# Patient Record
Sex: Female | Born: 1955 | Race: White | Hispanic: No | Marital: Single | Smoking: Never smoker
Health system: Southern US, Community
[De-identification: ages and names within clinical notes are randomized; demographics above are authoritative.]

## PROBLEM LIST (undated history)

## (undated) DIAGNOSIS — C801 Malignant (primary) neoplasm, unspecified: Secondary | ICD-10-CM

## (undated) DIAGNOSIS — C4491 Basal cell carcinoma of skin, unspecified: Secondary | ICD-10-CM

## (undated) HISTORY — PX: COLONOSCOPY: SHX174

---

## 2019-09-03 ENCOUNTER — Other Ambulatory Visit: Payer: Self-pay | Admitting: Radiology

## 2019-09-09 ENCOUNTER — Encounter: Payer: Self-pay | Admitting: *Deleted

## 2019-09-09 DIAGNOSIS — Z170 Estrogen receptor positive status [ER+]: Secondary | ICD-10-CM | POA: Insufficient documentation

## 2019-09-09 DIAGNOSIS — C50412 Malignant neoplasm of upper-outer quadrant of left female breast: Secondary | ICD-10-CM | POA: Insufficient documentation

## 2019-09-09 NOTE — Progress Notes (Signed)
[facility] CONSULT NOTE  Patient Care Team: Joella Prince as PCP - General (Physician Assistant) Mauro Kaufmann, RN as Oncology Nurse Navigator Rockwell Germany, RN as Oncology Nurse Navigator  CHIEF COMPLAINTS/PURPOSE OF CONSULTATION:  Newly diagnosed breast cancer  HISTORY OF PRESENTING ILLNESS:  Hannah Wallace 63 y.o. female is here because of recent diagnosis of invasive ductal carcinoma of the left breast. The cancer was detected on a routine screening mammogram on 918/20 that showed a 1.4cm indeterminate left breast mass. [place] on 08/22/19 showed a 1.0cm left breast mass and a 0.5cm lymph node with cortical thickening at the 2:00 position in the left breast. Biopsy on 09/03/19 showed invasive ductal carcinoma, grade 1, HER-2 negative by FISH, ER+ 90%, PR+ 100%, Ki67 2%, with no malignancy in the lymph node. She presents to the clinic today for initial evaluation and discussion of treatment options.   I reviewed her records extensively and collaborated the history with the patient.  SUMMARY OF ONCOLOGIC HISTORY: Oncology History  Malignant neoplasm of upper-outer quadrant of left breast in female, estrogen receptor positive ([place])  09/03/2019 Initial Diagnosis   Routine screening mammogram detected a 1.4cm indeterminate left breast mass. US showed a 1.0cm left breast mass and a 0.5cm lymph node with cortical thickening at the 2:00 position in the left breast. 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   09/10/2019 Cancer Staging   Staging form: Breast, AJCC 8th Edition - Clinical stage from 09/10/2019: Stage IA (cT1c, cN0, cM0, G2, ER+, PR+, HER2-) - Signed by Nicholas Lose, MD on 09/10/2019     MEDICAL HISTORY:  History reviewed. No pertinent past medical history.  SURGICAL HISTORY: History reviewed. No pertinent surgical history.  SOCIAL HISTORY: Social History   Socioeconomic History  .  Marital status: Single    Spouse name: Not on file  . Number of children: Not on file  . Years of education: Not on file  . Highest education level: Not on file  Occupational History  . Not on file  Social Needs  . Financial resource strain: Not on file  . Food insecurity    Worry: Not on file    Inability: Not on file  . Transportation needs    Medical: Not on file    Non-medical: Not on file  Tobacco Use  . Smoking status: Never Smoker  . Smokeless tobacco: Never Used  Substance and Sexual Activity  . Alcohol use: Yes    Comment: 4x a year  . Drug use: Never  . Sexual activity: Not on file  Lifestyle  . Physical activity    Days per week: Not on file    Minutes per session: Not on file  . Stress: Not on file  Relationships  . Social Herbalist on phone: Not on file    Gets together: Not on file    Attends religious service: Not on file    Active member of club or organization: Not on file    Attends meetings of clubs or organizations: Not on file    Relationship status: Not on file  . Intimate partner violence    Fear of current or ex partner: Not on file    Emotionally abused: Not on file    Physically abused: Not on file    Forced sexual activity: Not on file  Other Topics Concern  . Not on  file  Social History Narrative  . Not on file    FAMILY HISTORY: Family History  Problem Relation Age of Onset  . Lung cancer Father   . Hodgkin's lymphoma Sister   . Breast cancer Paternal Grandmother     ALLERGIES:  has No Known Allergies.  MEDICATIONS:  Current Outpatient Medications  Medication Sig Dispense Refill  . b complex vitamins capsule Take 1 capsule by mouth daily.    . Multiple Vitamin (MULTIVITAMIN) tablet Take 1 tablet by mouth daily.    . Omega-3 Fatty Acids (FISH OIL) 1000 MG CAPS Take 1,000 mg by mouth 2 (two) times daily.    Marland Kitchen UNABLE TO FIND Whole Foods Thyroid Complete    . Vitamin D-Vitamin K (VITAMIN K2-VITAMIN D3 PO) Take by  mouth.     No current facility-administered medications for this visit.     REVIEW OF SYSTEMS:   Constitutional: Denies fevers, chills or abnormal night sweats Eyes: Denies blurriness of vision, double vision or watery eyes Ears, nose, mouth, throat, and face: Denies mucositis or sore throat Respiratory: Denies cough, dyspnea or wheezes Cardiovascular: Denies palpitation, chest discomfort or lower extremity swelling Gastrointestinal:  Denies nausea, heartburn or change in bowel habits Skin: Denies abnormal skin rashes Lymphatics: Denies new lymphadenopathy or easy bruising Neurological:Denies numbness, tingling or new weaknesses Behavioral/Psych: Mood is stable, no new changes  Breast: Denies any palpable lumps or discharge All other systems were reviewed with the patient and are negative.  PHYSICAL EXAMINATION: ECOG PERFORMANCE STATUS: 1 - Symptomatic but completely ambulatory  Vitals:   09/10/19 0856  BP: 131/77  Pulse: 93  Resp: 18  Temp: 97.9 F (36.6 C)  SpO2: 99%   Filed Weights   09/10/19 0856  Weight: 206 lb 6.4 oz (93.6 kg)    GENERAL:alert, no distress and comfortable SKIN: skin color, texture, turgor are normal, no rashes or significant lesions EYES: normal, conjunctiva are pink and non-injected, sclera clear OROPHARYNX:no exudate, no erythema and lips, buccal mucosa, and tongue normal  NECK: supple, thyroid normal size, non-tender, without nodularity LYMPH:  no palpable lymphadenopathy in the cervical, axillary or inguinal LUNGS: clear to auscultation and percussion with normal breathing effort HEART: regular rate & rhythm and no murmurs and no lower extremity edema ABDOMEN:abdomen soft, non-tender and normal bowel sounds Musculoskeletal:no cyanosis of digits and no clubbing  PSYCH: alert & oriented x 3 with fluent speech NEURO: no focal motor/sensory deficits BREAST: No palpable nodules in breast. No palpable axillary or supraclavicular lymphadenopathy  (exam performed in the presence of a chaperone)   LABORATORY DATA:  I have reviewed the data as listed Lab Results  Component Value Date   WBC 7.4 09/10/2019   HGB 15.6 (H) 09/10/2019   HCT 46.9 (H) 09/10/2019   MCV 87.3 09/10/2019   PLT 223 09/10/2019   Lab Results  Component Value Date   NA 140 09/10/2019   K 4.1 09/10/2019   CL 104 09/10/2019   CO2 25 09/10/2019    RADIOGRAPHIC STUDIES: I have personally reviewed the radiological reports and agreed with the findings in the report.  ASSESSMENT AND PLAN:  Malignant neoplasm of upper-outer quadrant of left breast in female, estrogen receptor positive ([place]) 09/03/2019: Routine screening mammogram detected a 1.4cm indeterminate left breast mass. US showed a 1.0cm left breast mass and a 0.5cm lymph node with cortical thickening at the 2:00 position in the left breast. Biopsy showed IDC, grade 1, HER-2 - by FISH, ER+ 90%, PR+ 100%, Ki67 2%, with  no malignancy in the lymph node. T1c N0 stage Ia clinical stage  Pathology and radiology counseling:Discussed with the patient, the details of pathology including the type of breast cancer,the clinical staging, the significance of ER, PR and HER-2/neu receptors and the implications for treatment. After reviewing the pathology in detail, we proceeded to discuss the different treatment options between surgery, radiation, chemotherapy, antiestrogen therapies.  Recommendations: 1. Breast conserving surgery followed by 2. Oncotype DX testing to determine if chemotherapy would be of any benefit followed by 3. Adjuvant radiation therapy followed by 4. Adjuvant antiestrogen therapy  Oncotype counseling: I discussed Oncotype DX test. I explained to the patient that this is a 21 gene panel to evaluate patient tumors DNA to calculate recurrence score. This would help determine whether patient has high risk or intermediate risk or low risk breast cancer. She understands that if her tumor was found to be  high risk, she would benefit from systemic chemotherapy. If low risk, no need of chemotherapy. If she was found to be intermediate risk, we would need to evaluate the score as well as other risk factors and determine if an abbreviated chemotherapy may be of benefit.  Return to clinic after surgery to discuss final pathology report and then determine if Oncotype DX testing will need to be sent.     All questions were answered. The patient knows to call the clinic with any problems, questions or concerns.   Rulon Eisenmenger, MD, MPH 09/10/2019    I, Molly Dorshimer, am acting as scribe for Nicholas Lose, MD.  I have reviewed the above documentation for accuracy and completeness, and I agree with the above.

## 2019-09-10 ENCOUNTER — Encounter: Payer: Self-pay | Admitting: Physical Therapy

## 2019-09-10 ENCOUNTER — Other Ambulatory Visit: Payer: Self-pay

## 2019-09-10 ENCOUNTER — Inpatient Hospital Stay: Payer: BC Managed Care – PPO | Attending: Hematology and Oncology | Admitting: Hematology and Oncology

## 2019-09-10 ENCOUNTER — Ambulatory Visit
Admission: RE | Admit: 2019-09-10 | Discharge: 2019-09-10 | Disposition: A | Discharge status: Home / Self Care | Payer: BC Managed Care – PPO | Source: Ambulatory Visit | Attending: Radiation Oncology | Admitting: Radiation Oncology

## 2019-09-10 ENCOUNTER — Encounter: Payer: Self-pay | Admitting: Hematology and Oncology

## 2019-09-10 ENCOUNTER — Ambulatory Visit: Payer: BC Managed Care – PPO | Attending: General Surgery | Admitting: Physical Therapy

## 2019-09-10 ENCOUNTER — Inpatient Hospital Stay: Payer: BC Managed Care – PPO

## 2019-09-10 DIAGNOSIS — C50412 Malignant neoplasm of upper-outer quadrant of left female breast: Secondary | ICD-10-CM | POA: Insufficient documentation

## 2019-09-10 DIAGNOSIS — Z803 Family history of malignant neoplasm of breast: Secondary | ICD-10-CM | POA: Diagnosis not present

## 2019-09-10 DIAGNOSIS — Z170 Estrogen receptor positive status [ER+]: Secondary | ICD-10-CM | POA: Diagnosis not present

## 2019-09-10 DIAGNOSIS — Z807 Family history of other malignant neoplasms of lymphoid, hematopoietic and related tissues: Secondary | ICD-10-CM | POA: Insufficient documentation

## 2019-09-10 DIAGNOSIS — Z801 Family history of malignant neoplasm of trachea, bronchus and lung: Secondary | ICD-10-CM | POA: Insufficient documentation

## 2019-09-10 DIAGNOSIS — R293 Abnormal posture: Secondary | ICD-10-CM | POA: Diagnosis present

## 2019-09-10 LAB — CBC WITH DIFFERENTIAL (CANCER CENTER ONLY)
Abs Immature Granulocytes: 0.01 10*3/uL (ref 0.00–0.07)
Basophils Absolute: 0.1 10*3/uL (ref 0.0–0.1)
Basophils Relative: 1 %
Eosinophils Absolute: 0.1 10*3/uL (ref 0.0–0.5)
Eosinophils Relative: 2 %
HCT: 46.9 % — ABNORMAL HIGH (ref 36.0–46.0)
Hemoglobin: 15.6 g/dL — ABNORMAL HIGH (ref 12.0–15.0)
Immature Granulocytes: 0 %
Lymphocytes Relative: 33 %
Lymphs Abs: 2.5 10*3/uL (ref 0.7–4.0)
MCH: 29.1 pg (ref 26.0–34.0)
MCHC: 33.3 g/dL (ref 30.0–36.0)
MCV: 87.3 fL (ref 80.0–100.0)
Monocytes Absolute: 0.6 10*3/uL (ref 0.1–1.0)
Monocytes Relative: 8 %
Neutro Abs: 4.1 10*3/uL (ref 1.7–7.7)
Neutrophils Relative %: 56 %
Platelet Count: 223 10*3/uL (ref 150–400)
RBC: 5.37 MIL/uL — ABNORMAL HIGH (ref 3.87–5.11)
RDW: 13.9 % (ref 11.5–15.5)
WBC Count: 7.4 10*3/uL (ref 4.0–10.5)
nRBC: 0 % (ref 0.0–0.2)

## 2019-09-10 LAB — CMP (CANCER CENTER ONLY)
ALT: 47 U/L — ABNORMAL HIGH (ref 0–44)
AST: 19 U/L (ref 15–41)
Albumin: 4.6 g/dL (ref 3.5–5.0)
Alkaline Phosphatase: 69 U/L (ref 38–126)
Anion gap: 11 (ref 5–15)
BUN: 18 mg/dL (ref 8–23)
CO2: 25 mmol/L (ref 22–32)
Calcium: 9.5 mg/dL (ref 8.9–10.3)
Chloride: 104 mmol/L (ref 98–111)
Creatinine: 0.87 mg/dL (ref 0.44–1.00)
GFR, Est AFR Am: >60 mL/min (ref >60)
GFR, Estimated: >60 mL/min (ref >60)
Glucose, Bld: 85 mg/dL (ref 70–99)
Potassium: 4.1 mmol/L (ref 3.5–5.1)
Sodium: 140 mmol/L (ref 135–145)
Total Bilirubin: 0.5 mg/dL (ref 0.3–1.2)
Total Protein: 7.6 g/dL (ref 6.5–8.1)

## 2019-09-10 NOTE — Progress Notes (Signed)
Radiation Oncology         [phone] ________________________________  Name: Hannah Wallace        MRN: [id number]  Date of Service: 09/10/2019 DOB: Mar 02, 1956  QI:HKVQ, Meghan H, PA-C  Stark Klein, MD     REFERRING PHYSICIAN: Stark Klein, MD   DIAGNOSIS: The encounter diagnosis was Malignant neoplasm of upper-outer quadrant of left breast in female, estrogen receptor positive ([place]).   HISTORY OF PRESENT ILLNESS: Hannah Wallace is a 63 y.o. female seen in the multidisciplinary breast clinic for a new diagnosis of left breast cancer. The patient was noted to have a screening detected abnormality in the left breast.  A mass was seen in the upper outer quadrant measuring approximately 1.4 cm.  Further ultrasound imaging revealed a mass at 1 o'clock position measuring 1 cm and there was a intramammary lymph node that was prominent.  Her axillary lymph nodes were negative for adenopathy.  She underwent a biopsy on 09/03/2019 which revealed a negative intramammary lymph node.  Her breast biopsy revealed a grade 1-2 invasive ductal carcinoma, her tumor was ER/PR positive, HER-2 was negative and her Ki-67 was 2%.  She is seen today to discuss treatment recommendations for her cancer.    PREVIOUS RADIATION THERAPY: No   PAST MEDICAL HISTORY: No past medical history on file.     PAST SURGICAL HISTORY:No past surgical history on file.   FAMILY HISTORY:  Family History  Problem Relation Age of Onset  . Lung cancer Father   . Hodgkin's lymphoma Sister   . Breast cancer Paternal Grandmother      SOCIAL HISTORY:  reports that she has never smoked. She has never used smokeless tobacco. She reports current alcohol use. She reports that she does not use drugs. The patient lives in [place]. She is interested in having treatment in [place].    ALLERGIES: Patient has no known allergies.   MEDICATIONS:  Current Outpatient Medications  Medication Sig Dispense Refill  . b complex  vitamins capsule Take 1 capsule by mouth daily.    . Multiple Vitamin (MULTIVITAMIN) tablet Take 1 tablet by mouth daily.    . Omega-3 Fatty Acids (FISH OIL) 1000 MG CAPS Take 1,000 mg by mouth 2 (two) times daily.    Marland Kitchen UNABLE TO FIND Whole Foods Thyroid Complete    . Vitamin D-Vitamin K (VITAMIN K2-VITAMIN D3 PO) Take by mouth.     No current facility-administered medications for this encounter.      REVIEW OF SYSTEMS: On review of systems, the patient reports that she is doing well overall. She denies any chest pain, shortness of breath, cough, fevers, chills, night sweats, unintended weight changes. She denies any bowel or bladder disturbances, and denies abdominal pain, nausea or vomiting. She denies any new musculoskeletal or joint aches or pains. A complete review of systems is obtained and is otherwise negative.     PHYSICAL EXAM:  Wt Readings from Last 3 Encounters:  09/10/19 206 lb 6.4 oz (93.6 kg)   Temp Readings from Last 3 Encounters:  09/10/19 97.9 F (36.6 C) (Temporal)   BP Readings from Last 3 Encounters:  09/10/19 131/77   Pulse Readings from Last 3 Encounters:  09/10/19 93   In general this is a well appearing caucasian female in no acute distress. She's alert and oriented x4 and appropriate throughout the examination. Cardiopulmonary assessment is negative for acute distress and she exhibits normal effort. Bilateral breast exam is deferred.   ECOG = 0  0 - Asymptomatic (Fully active, able to carry on all predisease activities without restriction)  1 - Symptomatic but completely ambulatory (Restricted in physically strenuous activity but ambulatory and able to carry out work of a light or sedentary nature. For example, light housework, office work)  2 - Symptomatic, <50% in bed during the day (Ambulatory and capable of all self care but unable to carry out any work activities. Up and about more than 50% of waking hours)  3 - Symptomatic, >50% in bed, but not  bedbound (Capable of only limited self-care, confined to bed or chair 50% or more of waking hours)  4 - Bedbound (Completely disabled. Cannot carry on any self-care. Totally confined to bed or chair)  5 - Death   Eustace Pen MM, Creech RH, Tormey DC, et al. [phone]). "Toxicity and response criteria of the [facility]". [place] Oncol. 5 (6): 649-55    LABORATORY DATA:  Lab Results  Component Value Date   WBC 7.4 09/10/2019   HGB 15.6 (H) 09/10/2019   HCT 46.9 (H) 09/10/2019   MCV 87.3 09/10/2019   PLT 223 09/10/2019   Lab Results  Component Value Date   NA 140 09/10/2019   K 4.1 09/10/2019   CL 104 09/10/2019   CO2 25 09/10/2019   Lab Results  Component Value Date   ALT 47 (H) 09/10/2019   AST 19 09/10/2019   ALKPHOS 69 09/10/2019   BILITOT 0.5 09/10/2019      RADIOGRAPHY: No results found.     IMPRESSION/PLAN: 1. Stage IA, cT1cN0M0 grade 2 ER/PR positive invasive ductal carcinoma of the left breast. Dr. Lisbeth Renshaw discusses the pathology findings and reviews the nature of left breast disease. The consensus from the breast conference includes breast conservation with lumpectomy with sentinel node biopsy.  Dr. Lindi Adie anticipates an Oncotype Dx score to determine a role for systemic therapy. Provided that chemotherapy is not indicated, the patient's course would then be followed by external radiotherapy to the breast followed by antiestrogen therapy. We discussed the risks, benefits, short, and long term effects of radiotherapy, and the patient is interested in proceeding. Dr. Lisbeth Renshaw discusses the delivery and logistics of radiotherapy and anticipates a course of 4 or 6 1/2 weeks of radiotherapy to the left breast with deep inspiration breath hold technique. We will see her back about 2 weeks after surgery to discuss the simulation process and anticipate we starting radiotherapy about 4-6 weeks after surgery.    In a visit lasting 45 minutes, greater than 50% of  the time was spent face to face discussing her case, and coordinating the patient's care.  The above documentation reflects my direct findings during this shared patient visit. Please see the separate note by Dr. Lisbeth Renshaw on this date for the remainder of the patient's plan of care.    Carola Rhine, PAC

## 2019-09-10 NOTE — Therapy (Signed)
[place], [place], [address] Phone: [phone]   Fax:  [phone]  Physical Therapy Evaluation  Patient Details  Name: Hannah Wallace MRN: [id number] Date of Birth: 04/25/1956 Referring Provider (PT): Dr. Stark Klein   Encounter Date: 09/10/2019  PT End of Session - 09/10/19 1319    Visit Number  1    Number of Visits  2    Date for PT Re-Evaluation  11/05/19    PT Start Time  0957    PT Stop Time  1001   Also saw pt from [id number] for a total of 23 minutes   PT Time Calculation (min)  4 min    Activity Tolerance  Patient tolerated treatment well    Behavior During Therapy  [facility] for tasks assessed/performed       History reviewed. No pertinent past medical history.  History reviewed. No pertinent surgical history.  There were no vitals filed for this visit.   Subjective Assessment - 09/10/19 1303    Subjective  Patient reports she is here today to be seen by her medical team for her newly diagnosed left breast cancer.    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 It measures 1.4 cm and is located in the upper outer quadrant. It is ER/PR positive and HER2 negative with a Ki67 of 2%.    Patient Stated Goals  Reduce lymphedema risk and learn post op shoulder ROM HEP    Currently in Pain?  No/denies         [facility] PT Assessment - 09/10/19 0001      Assessment   Medical Diagnosis  Left breast cancer    Referring Provider (PT)  Dr. Stark Klein    Onset Date/Surgical Date  07/25/19    Hand Dominance  Right    Prior Therapy  none      Precautions   Precautions  Other (comment)    Precaution Comments  active cancer      Restrictions   Weight Bearing Restrictions  No      Balance Screen   Has the patient fallen in the past 6 months  No    Has the patient had a decrease in activity level because of a fear of  falling?   No    Is the patient reluctant to leave their home because of a fear of falling?   No      Home Environment   Living Environment  Private residence    Living Arrangements  Alone    Available Help at Discharge  Family      Prior Function   Level of Independence  Independent    Vocation  Full time employment    Midwife    Leisure  She walks 4x/week for 20 min      Cognition   Overall Cognitive Status  Within Functional Limits for tasks assessed      Posture/Postural Control   Posture/Postural Control  Postural limitations    Postural Limitations  Rounded Shoulders;Forward head      ROM / Strength   AROM / PROM / Strength  AROM;Strength      AROM   Overall AROM Comments  Cervical AROM is WNL    AROM Assessment Site  Shoulder    Right/Left Shoulder  Right;Left    Right Shoulder Extension  60 Degrees    Right Shoulder Flexion  158 Degrees    Right Shoulder ABduction  165 Degrees    Right Shoulder Internal Rotation  76 Degrees    Right Shoulder External Rotation  88 Degrees    Left Shoulder Extension  47 Degrees    Left Shoulder Flexion  155 Degrees    Left Shoulder ABduction  164 Degrees    Left Shoulder Internal Rotation  77 Degrees    Left Shoulder External Rotation  88 Degrees      Strength   Overall Strength  Within functional limits for tasks performed        LYMPHEDEMA/ONCOLOGY QUESTIONNAIRE - 09/10/19 1318      Type   Cancer Type  Left breast cancer      Lymphedema Assessments   Lymphedema Assessments  Upper extremities      Right Upper Extremity Lymphedema   10 cm Proximal to Olecranon Process  32.2 cm    Olecranon Process  28.3 cm    10 cm Proximal to Ulnar Styloid Process  25.4 cm    Just Proximal to Ulnar Styloid Process  17.7 cm    Across Hand at [employer]  19.8 cm    At [place] of 2nd Digit  6.5 cm      Left Upper Extremity Lymphedema   10 cm Proximal to Olecranon Process  33.2 cm    Olecranon Process   28.7 cm    10 cm Proximal to Ulnar Styloid Process  24.9 cm    Just Proximal to Ulnar Styloid Process  17.6 cm    Across Hand at [employer]  19.3 cm    At [place] of 2nd Digit  6.4 cm          Quick Dash - 09/10/19 0001    Open a tight or new jar  No difficulty    Do heavy household chores (wash walls, wash floors)  No difficulty    Carry a shopping bag or briefcase  No difficulty    Wash your back  No difficulty    Use a knife to cut food  No difficulty    Recreational activities in which you take some force or impact through your arm, shoulder, or hand (golf, hammering, tennis)  No difficulty    During the past week, to what extent has your arm, shoulder or hand problem interfered with your normal social activities with family, friends, neighbors, or groups?  Not at all    During the past week, to what extent has your arm, shoulder or hand problem limited your work or other regular daily activities  Not at all    Arm, shoulder, or hand pain.  None    Tingling (pins and needles) in your arm, shoulder, or hand  None    Difficulty Sleeping  No difficulty    DASH Score  0 %        Objective measurements completed on examination: See above findings.       Patient was instructed today in a home exercise program today for post op shoulder range of motion. These included active assist shoulder flexion in sitting, scapular retraction, wall walking with shoulder abduction, and hands behind head external rotation.  She was encouraged to do these twice a day, holding 3 seconds and repeating 5 times when permitted by her physician.           PT Education - 09/10/19 1319    Education Details  Lymphedema risk reduction and post op shoulder ROM HEP  Person(s) Educated  Patient    Methods  Explanation;Demonstration;Handout    Comprehension  Returned demonstration;Verbalized understanding          PT Long Term Goals - 09/10/19 1341      PT LONG TERM GOAL #1   Title   Patient will demonstrate she has regained full shoulder ROM and function post operatively compared to bsaeline assessment.    Time  8    Period  Weeks    Status  New      Breast Clinic Goals - 09/10/19 1341      Patient will be able to verbalize understanding of pertinent lymphedema risk reduction practices relevant to her diagnosis specifically related to skin care.   Time  1    Period  Days    Status  Achieved      Patient will be able to return demonstrate and/or verbalize understanding of the post-op home exercise program related to regaining shoulder range of motion.   Time  1    Period  Days    Status  Achieved      Patient will be able to verbalize understanding of the importance of attending the postoperative After Breast Cancer Class for further lymphedema risk reduction education and therapeutic exercise.   Time  1    Period  Days    Status  Achieved            Plan - 09/10/19 1320    Clinical Impression Statement  Patient was diagnosed on 07/25/2019 with left grade I-II invasive ductal carcinoma breast cancer. It measures 1.4 cm and is located in the upper outer quadrant. It is ER/PR positive and HER2 negative with a Ki67 of 2%. Her multidisciplinary medical team met prior to her assessments to determine a recommended treatment plan. She is planning to have a left lumpectomy and sentinel node biopsy followed by Oncotype testing, radiation, and anti-estrogen therapy. She will benefit from a post op Pt reassessmnet to determine needs.    Stability/Clinical Decision Making  Stable/Uncomplicated    Clinical Decision Making  Low    Rehab Potential  Excellent    PT Frequency  --   Eval and 1 f/u visit   PT Treatment/Interventions  ADLs/Self Care Home Management;Therapeutic exercise;Patient/family education    PT Next Visit Plan  Will reassess 3-4 weeks post op    PT Home Exercise Plan  Post op shoulder ROM HEP    Consulted and Agree with Plan of Care  Patient        Patient will benefit from skilled therapeutic intervention in order to improve the following deficits and impairments:  Decreased knowledge of precautions, Impaired UE functional use, Pain, Postural dysfunction, Decreased range of motion  Visit Diagnosis: Malignant neoplasm of upper-outer quadrant of left breast in female, estrogen receptor positive ([place]) - Plan: PT plan of care cert/re-cert  Abnormal posture - Plan: PT plan of care cert/re-cert   Patient will follow up at outpatient cancer rehab 3-4 weeks following surgery.  If the patient requires physical therapy at that time, a specific plan will be dictated and sent to the referring physician for approval. The patient was educated today on appropriate basic range of motion exercises to begin post operatively and the importance of attending the After Breast Cancer class following surgery.  Patient was educated today on lymphedema risk reduction practices as it pertains to recommendations that will benefit the patient immediately following surgery.  She verbalized good understanding.     Problem List  Patient Active Problem List   Diagnosis Date Noted  . Malignant neoplasm of upper-outer quadrant of left breast in female, estrogen receptor positive ([place]) 09/09/2019   Annia Friendly, PT 09/10/19 1:43 PM  [place], [place], [address] Phone: [phone]   Fax:  [phone]  Name: Xavia Kniskern MRN: [id number] Date of Birth: [birth year]

## 2019-09-10 NOTE — Assessment & Plan Note (Signed)
09/03/2019: Routine screening mammogram detected a 1.4cm indeterminate left breast mass. US showed a 1.0cm left breast mass and a 0.5cm lymph node with cortical thickening at the 2:00 position in the left breast. 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 T1c N0 stage Ia clinical stage  Pathology and radiology counseling:Discussed with the patient, the details of pathology including the type of breast cancer,the clinical staging, the significance of ER, PR and HER-2/neu receptors and the implications for treatment. After reviewing the pathology in detail, we proceeded to discuss the different treatment options between surgery, radiation, chemotherapy, antiestrogen therapies.  Recommendations: 1. Breast conserving surgery followed by 2. Oncotype DX testing to determine if chemotherapy would be of any benefit followed by 3. Adjuvant radiation therapy followed by 4. Adjuvant antiestrogen therapy  Oncotype counseling: I discussed Oncotype DX test. I explained to the patient that this is a 21 gene panel to evaluate patient tumors DNA to calculate recurrence score. This would help determine whether patient has high risk or intermediate risk or low risk breast cancer. She understands that if her tumor was found to be high risk, she would benefit from systemic chemotherapy. If low risk, no need of chemotherapy. If she was found to be intermediate risk, we would need to evaluate the score as well as other risk factors and determine if an abbreviated chemotherapy may be of benefit.  Return to clinic after surgery to discuss final pathology report and then determine if Oncotype DX testing will need to be sent.

## 2019-09-10 NOTE — Progress Notes (Signed)
[place] Psychosocial Distress Screening Clinical Social Work  Clinical Social Work was referred by distress screening protocol.  The patient scored a 1 on the Psychosocial Distress Thermometer which indicates mild distress. Counseling Intern met with patient in exam room to assess for distress and other psychosocial needs.   ONCBCN DISTRESS SCREENING 09/10/2019  Distress experienced in past week (1-10) 1  Practical problem type Insurance  Physical Problem type Sleep/insomnia  Referral to support programs Yes   Clinical Social Worker follow up needed: No.  Counseling Intern Note: Met with patient in exam room on 09/10/2019 at [facility] as representative for the patient and family support team to provide patient information about support services available and check on patient's needs for support.  Pt stated she is feeling "good" and stated she is currently not experiencing distress. Pt reported that she has some worries about insurance, especially making sure her treatment is covered by insurance, but otherwise reports no worries or needs at this time.  Pt stated that her sister is a cancer survivor and she provided support for her sister at that time so she is familiar with the process from that experience. Pt denied need for counseling services at this time.  Pt stated she has good support from family and friends. Pt stated she may look into a support group to connect with others who are going through breast cancer.      Hannah Wallace [place] Counseling Intern Voicemail:  [phone]

## 2019-09-10 NOTE — Patient Instructions (Signed)

## 2019-09-12 ENCOUNTER — Other Ambulatory Visit: Payer: Self-pay | Admitting: General Surgery

## 2019-09-12 DIAGNOSIS — C50412 Malignant neoplasm of upper-outer quadrant of left female breast: Secondary | ICD-10-CM

## 2019-09-12 DIAGNOSIS — Z170 Estrogen receptor positive status [ER+]: Secondary | ICD-10-CM

## 2019-09-16 ENCOUNTER — Telehealth: Payer: Self-pay | Admitting: *Deleted

## 2019-09-16 NOTE — Telephone Encounter (Signed)
Spoke to pt concerning [place] from 09/10/19. Denies questions or concerns regarding dx or treatment care plan. Encourage pt to call with needs. Received verbal understanding.

## 2019-09-17 ENCOUNTER — Telehealth: Payer: Self-pay | Admitting: Hematology and Oncology

## 2019-09-17 ENCOUNTER — Telehealth: Payer: Self-pay

## 2019-09-17 NOTE — Telephone Encounter (Signed)
Nutrition Assessment  Reason for Assessment:  Pt attended Breast Clinic on 11/4 and was given nutrition packet by nurse navigator  ASSESSMENT:  63 year old female with new diagnosis of left breast cancer.  Planning lumpectomy with oncotype, adjuvant radiation and antiestrogens.  Spoke with patient by phone to introduce self and service.  Patient anxious to hear about a surgery date.    Medications:  Vit b, MVI, omega 3, Vit D  Labs: reviewed  Anthropometrics:   Height: 69.5 inches Weight: 200 lb BMI: 30   NUTRITION DIAGNOSIS: Food and nutrition related knowledge deficit related to new diagnosis of breast cancer as evidenced by no prior need for nutrition related information.  INTERVENTION:  Patient planning to follow-up regarding surgery date.  Discussed briefly nutrition packet of information regarding nutritional tips for breast cancer patients. Questions answered.  Contact information provided and patient knows to contact me with questions/concerns.    MONITORING, EVALUATION, and GOAL: Pt will consume a healthy plant based diet to maintain lean body mass throughout treatment.   Orlena Garmon B. Zenia Resides, [place], [place] Registered Dietitian [phone] (pager)

## 2019-09-17 NOTE — Telephone Encounter (Signed)
Scheduled appt per 11/11 sch msg - pt is aware of appt date and time   

## 2019-09-19 ENCOUNTER — Other Ambulatory Visit: Payer: Self-pay | Admitting: General Surgery

## 2019-09-19 DIAGNOSIS — C50412 Malignant neoplasm of upper-outer quadrant of left female breast: Secondary | ICD-10-CM

## 2019-09-19 NOTE — Progress Notes (Signed)
[id number] Hannah Wallace, [place] [address] Phone: [phone] Fax: [phone]      Your procedure is scheduled on November 19  Report to [facility] Main Entrance "A" at 1300 P.M., and check in at the Admitting office.  Call this number if you have problems the morning of surgery:  [phone]  Call [phone] if you have any questions prior to your surgery date Monday-Friday 8am-4pm    Remember:  Do not eat after midnight the night before your surgery  You may drink clear liquids until 1200 pm the afternoon of your surgery.   Clear liquids allowed are: Water, Non-Citrus Juices (without pulp), Carbonated Beverages, Clear Tea, Black Coffee Only, and Gatorade    Take these medicines the morning of surgery : use eye drops if needed  7 days prior to surgery STOP taking any Aspirin (unless otherwise instructed by your surgeon), Aleve, Naproxen, Ibuprofen, Motrin, Advil, Goody's, BC's, all herbal medications, fish oil, and all vitamins.    The Morning of Surgery  Do not wear jewelry, make-up or nail polish.  Do not wear lotions, powders, or perfumes/colognes, or deodorant  Do not shave 48 hours prior to surgery.  Men may shave face and neck.  Do not bring valuables to the hospital.  [facility] is not responsible for any belongings or valuables.  If you are a smoker, DO NOT Smoke 24 hours prior to surgery IF you wear a CPAP at night please bring your mask, tubing, and machine the morning of surgery   Remember that you must have someone to transport you home after your surgery, and remain with you for 24 hours if you are discharged the same day.   Contacts, glasses, hearing aids, dentures or bridgework may not be worn into surgery.    Leave your suitcase in the car.  After surgery it may be brought to your room.  For patients admitted to the hospital, discharge time will be determined by your treatment  team.  Patients discharged the day of surgery will not be allowed to drive home.    Special instructions:   [place]- Preparing For Surgery  Before surgery, you can play an important role. Because skin is not sterile, your skin needs to be as free of germs as possible. You can reduce the number of germs on your skin by washing with CHG (chlorahexidine gluconate) Soap before surgery.  CHG is an antiseptic cleaner which kills germs and bonds with the skin to continue killing germs even after washing.    Oral Hygiene is also important to reduce your risk of infection.  Remember - BRUSH YOUR TEETH THE MORNING OF SURGERY WITH YOUR REGULAR TOOTHPASTE  Please do not use if you have an allergy to CHG or antibacterial soaps. If your skin becomes reddened/irritated stop using the CHG.  Do not shave (including legs and underarms) for at least 48 hours prior to first CHG shower. It is OK to shave your face.  Please follow these instructions carefully.   1. Shower the NIGHT BEFORE SURGERY and the MORNING OF SURGERY with CHG Soap.   2. If you chose to wash your hair, wash your hair first as usual with your normal shampoo.  3. After you shampoo, rinse your hair and body thoroughly to remove the shampoo.  4. Use CHG as you would any other liquid soap. You can apply CHG directly to the skin and wash gently with a scrungie or a  clean washcloth.   5. Apply the CHG Soap to your body ONLY FROM THE NECK DOWN.  Do not use on open wounds or open sores. Avoid contact with your eyes, ears, mouth and genitals (private parts). Wash Face and genitals (private parts)  with your normal soap.   6. Wash thoroughly, paying special attention to the area where your surgery will be performed.  7. Thoroughly rinse your body with warm water from the neck down.  8. DO NOT shower/wash with your normal soap after using and rinsing off the CHG Soap.  9. Pat yourself dry with a CLEAN TOWEL.  10. Wear CLEAN PAJAMAS to bed  the night before surgery, wear comfortable clothes the morning of surgery  11. Place CLEAN SHEETS on your bed the night of your first shower and DO NOT SLEEP WITH PETS.    Day of Surgery:  Do not apply any deodorants/lotions. Please shower the morning of surgery with the CHG soap  Please wear clean clothes to the hospital/surgery center.   Remember to brush your teeth WITH YOUR REGULAR TOOTHPASTE.   Please read over the following fact sheets that you were given.

## 2019-09-22 ENCOUNTER — Other Ambulatory Visit (HOSPITAL_COMMUNITY)
Admission: RE | Admit: 2019-09-22 | Discharge: 2019-09-22 | Disposition: A | Discharge status: Home / Self Care | Payer: BC Managed Care – PPO | Source: Ambulatory Visit | Attending: General Surgery | Admitting: General Surgery

## 2019-09-22 ENCOUNTER — Other Ambulatory Visit: Payer: Self-pay

## 2019-09-22 ENCOUNTER — Encounter (HOSPITAL_COMMUNITY)
Admission: RE | Admit: 2019-09-22 | Discharge: 2019-09-22 | Disposition: A | Discharge status: Home / Self Care | Payer: BC Managed Care – PPO | Source: Ambulatory Visit | Attending: General Surgery | Admitting: General Surgery

## 2019-09-22 ENCOUNTER — Encounter (HOSPITAL_COMMUNITY): Payer: Self-pay

## 2019-09-22 ENCOUNTER — Other Ambulatory Visit (HOSPITAL_COMMUNITY): Payer: BC Managed Care – PPO

## 2019-09-22 DIAGNOSIS — Z01812 Encounter for preprocedural laboratory examination: Secondary | ICD-10-CM | POA: Insufficient documentation

## 2019-09-22 DIAGNOSIS — Z20828 Contact with and (suspected) exposure to other viral communicable diseases: Secondary | ICD-10-CM | POA: Diagnosis not present

## 2019-09-22 DIAGNOSIS — C50912 Malignant neoplasm of unspecified site of left female breast: Secondary | ICD-10-CM | POA: Insufficient documentation

## 2019-09-22 HISTORY — DX: Malignant (primary) neoplasm, unspecified: C80.1

## 2019-09-22 HISTORY — DX: Basal cell carcinoma of skin, unspecified: C44.91

## 2019-09-22 LAB — CBC
HCT: 45.9 % (ref 36.0–46.0)
Hemoglobin: 15.3 g/dL — ABNORMAL HIGH (ref 12.0–15.0)
MCH: 29.9 pg (ref 26.0–34.0)
MCHC: 33.3 g/dL (ref 30.0–36.0)
MCV: 89.8 fL (ref 80.0–100.0)
Platelets: 242 10*3/uL (ref 150–400)
RBC: 5.11 MIL/uL (ref 3.87–5.11)
RDW: 14.2 % (ref 11.5–15.5)
WBC: 9.2 10*3/uL (ref 4.0–10.5)
nRBC: 0 % (ref 0.0–0.2)

## 2019-09-22 LAB — SARS CORONAVIRUS 2 (TAT 6-24 HRS): SARS Coronavirus 2: NEGATIVE

## 2019-09-22 NOTE — Progress Notes (Signed)
PCP - Arrie Eastern, PA Cardiologist - patient denies  PPM/ICD - n/a Device Orders -  Rep Notified -   Chest x-ray - n/a EKG - n/a Stress Test - patient denies ECHO - patient denies Cardiac Cath - patient denies  Sleep Study - patient denies CPAP -   Fasting Blood Sugar - n/a Checks Blood Sugar _____ times a day  Blood Thinner Instructions: n/a Aspirin Instructions: n/a  ERAS Protcol - clears until 12:00 noon PRE-SURGERY Ensure or G2- none ordered  COVID TEST- after PAT appointment 09/22/2019   Anesthesia review: n/a  Patient denies shortness of breath, fever, cough and chest pain at PAT appointment   All instructions explained to the patient, with a verbal understanding of the material. Patient agrees to go over the instructions while at home for a better understanding. Patient also instructed to self quarantine after being tested for COVID-19. The opportunity to ask questions was provided.

## 2019-09-22 NOTE — H&P (Signed)
Hannah Wallace Documented: 09/10/2019 7:24 AM Location: [facility] Patient #: [id number] DOB: May 03, 1956 Undefined / Language: Hannah Wallace / Race: White Female   History of Present Illness Hannah Klein MD; 09/10/2019 12:47 PM) The patient is a 63 year old female who presents with breast cancer. Pt is a 63 yo F referred by Dr. Luan Pulling for a new diagnosis of left breast cancer 08/2019. She presented with a screening detected left breast mass. Diagnostic imaging was obtained which showed a 1.4 cm spiculated mass at 1 o'clock. A core needle biopsy was performed which showed an invasive ductal carcinoma, grade 1-2, ER/PR positive, Her 2 negative, Ki 67 2%. There was a slightly abnormal intramammary node that was also biopsied and was negative and concordant.   She has a history of basal cell carcinoma, but no other cancers. She had a history of a sister with hodgkin's lymphoma and a father with lung cancer. Her paternal grandmother had breast cancer at age 3.   She had menarche at age 64. She is a G0. She is post menopausal x 5 years. She did not take HRT or OCPs. She had a colonoscopy in 2012, but has not had a bone density study.   Mammograms are reviewed. These are from [place].   pathology 09/03/2019 Diagnosis 1. Breast, left, needle core biopsy, 1 o'clock, 6cmfn - INVASIVE DUCTAL CARCINOMA, GRADE 1/2. - SEE MICROSCOPIC DESCRIPTION. 2. Lymph node, needle/core biopsy, left intramammary @ 2 o'clock, 6cmfn - LYMPH NODE TISSUE WITH NO METASTATIC CARCINOMA. Estrogen Receptor: 90%, POSITIVE, MODERATE STAINING INTENSITY Progesterone Receptor: 100%, POSITIVE, STRONG STAINING INTENSITY Proliferation Marker Ki67: 2% GROUP 5: HER2 **NEGATIVE**  Labs: 09/10/2019 CMET essentially normal and CBC essentially normal.     Past Surgical History Tawni Pummel, RN; 09/10/2019 7:24 AM) No pertinent past surgical history   Diagnostic Studies History Tawni Pummel, RN;  09/10/2019 7:24 AM) Colonoscopy  5-10 years ago Mammogram  within last year Pap Smear  1-5 years ago  Medication History Tawni Pummel, RN; 09/10/2019 7:24 AM) Medications Reconciled  Social History Tawni Pummel, RN; 09/10/2019 7:24 AM) Alcohol use  Occasional alcohol use. Caffeine use  Coffee, Tea. No drug use  Tobacco use  Never smoker.  Family History Tawni Pummel, RN; 09/10/2019 7:24 AM) Breast Cancer  Family Members In General. Cancer  Sister. Respiratory Condition  Father.  Pregnancy / Birth History Tawni Pummel, RN; 09/10/2019 7:24 AM) Age at menarche  53 years. Age of menopause  22-55 Gravida  0 Irregular periods  Para  0  Other Problems Tawni Pummel, RN; 09/10/2019 7:24 AM) Bladder Problems  High blood pressure  Hypercholesterolemia     Review of Systems Sunday Spillers [place] RN; 09/10/2019 7:24 AM) General Present- Weight Gain. Not Present- Appetite Loss, Chills, Fatigue, Fever, Night Sweats and Weight Loss. Skin Not Present- Change in Wart/Mole, Dryness, Hives, Jaundice, New Lesions, Non-Healing Wounds, Rash and Ulcer. HEENT Not Present- Earache, Hearing Loss, Hoarseness, Nose Bleed, Oral Ulcers, Ringing in the Ears, Seasonal Allergies, Sinus Pain, Sore Throat, Visual Disturbances, Wears glasses/contact lenses and Yellow Eyes. Respiratory Not Present- Bloody sputum, Chronic Cough, Difficulty Breathing, Snoring and Wheezing. Breast Not Present- Breast Mass, Breast Pain, Nipple Discharge and Skin Changes. Cardiovascular Present- Leg Cramps. Not Present- Chest Pain, Difficulty Breathing Lying Down, Palpitations, Rapid Heart Rate, Shortness of Breath and Swelling of Extremities. Gastrointestinal Not Present- Abdominal Pain, Bloating, Bloody Stool, Change in Bowel Habits, Chronic diarrhea, Constipation, Difficulty Swallowing, Excessive gas, Gets full quickly at meals, Hemorrhoids, Indigestion, Nausea, Rectal Pain and Vomiting.  Female Genitourinary  Not Present- Frequency, Nocturia, Painful Urination, Pelvic Pain and Urgency. Musculoskeletal Not Present- Back Pain, Joint Pain, Joint Stiffness, Muscle Pain, Muscle Weakness and Swelling of Extremities. Neurological Not Present- Decreased Memory, Fainting, Headaches, Numbness, Seizures, Tingling, Tremor, Trouble walking and Weakness. Psychiatric Not Present- Anxiety, Bipolar, Change in Sleep Pattern, Depression, Fearful and Frequent crying. Endocrine Not Present- Cold Intolerance, Excessive Hunger, Hair Changes, Heat Intolerance, Hot flashes and New Diabetes. Hematology Present- Gland problems. Not Present- Blood Thinners, Easy Bruising, Excessive bleeding, HIV and Persistent Infections.  Vitals Hannah Klein MD; 09/10/2019 12:40 PM) 09/10/2019 12:40 PM Weight: 206.4 lb Height: 69in Body Surface Area: 2.09 m Body Mass Index: 30.48 kg/m  Temp.: 97.4F  Pulse: 93 (Regular)  Resp.: 18 (Unlabored)  BP: 131/77 (Sitting, Left Arm, Standard)       Physical Exam Hannah Klein MD; 09/10/2019 12:48 PM) General Mental Status-Alert. General Appearance-Consistent with stated age. Hydration-Well hydrated. Voice-Normal.  Head and Neck Head-normocephalic, atraumatic with no lesions or palpable masses. Trachea-midline. Thyroid Gland Characteristics - normal size and consistency.  Eye Eyeball - Bilateral-Extraocular movements intact. Sclera/Conjunctiva - Bilateral-No scleral icterus.  Chest and Lung Exam Chest and lung exam reveals -quiet, even and easy respiratory effort with no use of accessory muscles and on auscultation, normal breath sounds, no adventitious sounds and normal vocal resonance. Inspection Chest Wall - Normal. Back - normal.  Breast Note: breasts relatively symmetric with mild ptosis. bruising on upper outer left breast. no palpable mass. no nipple retraction or skin dimpling. no LAD. No nipple discharge. right side  normal.   Cardiovascular Cardiovascular examination reveals -normal heart sounds, regular rate and rhythm with no murmurs and normal pedal pulses bilaterally.  Abdomen Inspection Inspection of the abdomen reveals - No Hernias. Palpation/Percussion Palpation and Percussion of the abdomen reveal - Soft, Non Tender, No Rebound tenderness, No Rigidity (guarding) and No hepatosplenomegaly. Auscultation Auscultation of the abdomen reveals - Bowel sounds normal.  Neurologic Neurologic evaluation reveals -alert and oriented x 3 with no impairment of recent or remote memory. Mental Status-Normal.  Musculoskeletal Global Assessment -Note: no gross deformities.  Normal Exam - Left-Upper Extremity Strength Normal and Lower Extremity Strength Normal. Normal Exam - Right-Upper Extremity Strength Normal and Lower Extremity Strength Normal.  Lymphatic Head & Neck  General Head & Neck Lymphatics: Bilateral - Description - Normal. Axillary  General Axillary Region: Bilateral - Description - Normal. Tenderness - Non Tender. Femoral & Inguinal  Generalized Femoral & Inguinal Lymphatics: Bilateral - Description - No Generalized lymphadenopathy.    Assessment & Plan Hannah Klein MD; 09/10/2019 12:50 PM) MALIGNANT NEOPLASM OF UPPER-OUTER QUADRANT OF LEFT BREAST IN FEMALE, ESTROGEN RECEPTOR POSITIVE (C50.412) Impression: pt has a new diagnosis of a cT1N0 left breast cancer. We will plan seed localized lumpectomy and sentinel lymph node biopsy. This will be followed by XRT and antiestrogen therapy. Oncotype also recommended.  The surgical procedure was described to the patient. I discussed the incision type and location and that we would need radiology involved on with a wire or seed marker and/or sentinel node.  The risks and benefits of the procedure were described to the patient and she wishes to proceed.  We discussed the risks bleeding, infection, damage to other structures,  need for further procedures/surgeries. We discussed the risk of seroma. The patient was advised if the area in the breast in cancer, we may need to go back to surgery for additional tissue to obtain negative margins or for a lymph node biopsy. The patient  was advised that these are the most common complications, but that others can occur as well. They were advised against taking aspirin or other anti-inflammatory agents/blood thinners the week before surgery. Current Plans You are being scheduled for surgery- Our schedulers will call you.  You should hear from our office's scheduling department within 5 working days about the location, date, and time of surgery. We try to make accommodations for patient's preferences in scheduling surgery, but sometimes the OR schedule or the surgeon's schedule prevents [place] from making those accommodations.  If you have not heard from our office [phone] in 5 working days, call the office and ask for your surgeon's nurse.  If you have other questions about your diagnosis, plan, or surgery, call the office and ask for your surgeon's nurse.    Signed electronically by Hannah Klein, MD (09/10/2019 12:51 PM)

## 2019-09-25 ENCOUNTER — Ambulatory Visit (HOSPITAL_COMMUNITY)
Admission: RE | Admit: 2019-09-25 | Discharge: 2019-09-25 | Disposition: A | Discharge status: Home / Self Care | Payer: BC Managed Care – PPO | Attending: General Surgery | Admitting: General Surgery

## 2019-09-25 ENCOUNTER — Encounter (HOSPITAL_COMMUNITY): Payer: Self-pay | Admitting: *Deleted

## 2019-09-25 ENCOUNTER — Ambulatory Visit (HOSPITAL_COMMUNITY): Payer: BC Managed Care – PPO | Admitting: Physician Assistant

## 2019-09-25 ENCOUNTER — Encounter (HOSPITAL_COMMUNITY)
Admission: RE | Disposition: A | Discharge status: Home / Self Care | Payer: Self-pay | Source: Home / Self Care | Attending: General Surgery

## 2019-09-25 ENCOUNTER — Ambulatory Visit (HOSPITAL_COMMUNITY)
Admission: RE | Admit: 2019-09-25 | Discharge: 2019-09-25 | Disposition: A | Discharge status: Home / Self Care | Payer: BC Managed Care – PPO | Source: Ambulatory Visit | Attending: General Surgery | Admitting: General Surgery

## 2019-09-25 ENCOUNTER — Other Ambulatory Visit: Payer: Self-pay

## 2019-09-25 ENCOUNTER — Ambulatory Visit (HOSPITAL_COMMUNITY): Payer: BC Managed Care – PPO | Admitting: Anesthesiology

## 2019-09-25 DIAGNOSIS — C50412 Malignant neoplasm of upper-outer quadrant of left female breast: Secondary | ICD-10-CM

## 2019-09-25 DIAGNOSIS — Z85828 Personal history of other malignant neoplasm of skin: Secondary | ICD-10-CM | POA: Diagnosis not present

## 2019-09-25 DIAGNOSIS — Z170 Estrogen receptor positive status [ER+]: Secondary | ICD-10-CM | POA: Insufficient documentation

## 2019-09-25 DIAGNOSIS — Z803 Family history of malignant neoplasm of breast: Secondary | ICD-10-CM | POA: Insufficient documentation

## 2019-09-25 HISTORY — PX: BREAST LUMPECTOMY WITH RADIOACTIVE SEED AND SENTINEL LYMPH NODE BIOPSY: SHX6550

## 2019-09-25 SURGERY — BREAST LUMPECTOMY WITH RADIOACTIVE SEED AND SENTINEL LYMPH NODE BIOPSY
Anesthesia: Regional | Site: Breast | Laterality: Left

## 2019-09-25 MED ORDER — LIDOCAINE-EPINEPHRINE 1 %-1:100000 IJ SOLN
INTRAMUSCULAR | Status: AC
  Filled 2019-09-25: qty 1

## 2019-09-25 MED ORDER — CEFAZOLIN SODIUM-DEXTROSE 2-4 GM/100ML-% IV SOLN
2.0000 g | INTRAVENOUS | Status: AC
  Administered 2019-09-25: 2 g via INTRAVENOUS

## 2019-09-25 MED ORDER — DEXAMETHASONE SODIUM PHOSPHATE 10 MG/ML IJ SOLN
INTRAMUSCULAR | Status: DC | PRN
  Administered 2019-09-25: 10 mg via INTRAVENOUS

## 2019-09-25 MED ORDER — PHENYLEPHRINE 40 MCG/ML (10ML) SYRINGE FOR IV PUSH (FOR BLOOD PRESSURE SUPPORT)
PREFILLED_SYRINGE | INTRAVENOUS | Status: DC | PRN
  Administered 2019-09-25 (×2): 80 ug via INTRAVENOUS
  Administered 2019-09-25: 40 ug via INTRAVENOUS

## 2019-09-25 MED ORDER — BUPIVACAINE HCL (PF) 0.25 % IJ SOLN
INTRAMUSCULAR | Status: AC
  Filled 2019-09-25: qty 30

## 2019-09-25 MED ORDER — HYDROMORPHONE HCL 1 MG/ML IJ SOLN: 0.2500 mg | INTRAMUSCULAR | Status: DC | PRN

## 2019-09-25 MED ORDER — 0.9 % SODIUM CHLORIDE (POUR BTL) OPTIME
TOPICAL | Status: DC | PRN
  Administered 2019-09-25: 1000 mL

## 2019-09-25 MED ORDER — CHLORHEXIDINE GLUCONATE CLOTH 2 % EX PADS: 6.0000 | MEDICATED_PAD | Freq: Once | CUTANEOUS | Status: DC

## 2019-09-25 MED ORDER — ROPIVACAINE HCL 5 MG/ML IJ SOLN
INTRAMUSCULAR | Status: DC | PRN
  Administered 2019-09-25: 30 mL via PERINEURAL

## 2019-09-25 MED ORDER — MIDAZOLAM HCL 2 MG/2ML IJ SOLN
2.0000 mg | Freq: Once | INTRAMUSCULAR | Status: AC
  Administered 2019-09-25: 14:00:00 2 mg via INTRAVENOUS

## 2019-09-25 MED ORDER — MIDAZOLAM HCL 2 MG/2ML IJ SOLN
INTRAMUSCULAR | Status: AC
  Administered 2019-09-25: 2 mg via INTRAVENOUS
  Filled 2019-09-25: qty 2

## 2019-09-25 MED ORDER — ACETAMINOPHEN 500 MG PO TABS
ORAL_TABLET | ORAL | Status: AC
  Administered 2019-09-25: 1000 mg via ORAL
  Filled 2019-09-25: qty 2

## 2019-09-25 MED ORDER — LIDOCAINE 2% (20 MG/ML) 5 ML SYRINGE
INTRAMUSCULAR | Status: AC
  Filled 2019-09-25: qty 5

## 2019-09-25 MED ORDER — ACETAMINOPHEN 500 MG PO TABS
1000.0000 mg | ORAL_TABLET | ORAL | Status: AC
  Administered 2019-09-25: 13:00:00 1000 mg via ORAL

## 2019-09-25 MED ORDER — TECHNETIUM TC 99M SULFUR COLLOID FILTERED
1.0000 | Freq: Once | INTRAVENOUS | Status: AC | PRN
  Administered 2019-09-25: 15:00:00 1 via INTRADERMAL

## 2019-09-25 MED ORDER — PROPOFOL 10 MG/ML IV BOLUS
INTRAVENOUS | Status: DC | PRN
  Administered 2019-09-25: 150 mg via INTRAVENOUS
  Administered 2019-09-25: 50 mg via INTRAVENOUS

## 2019-09-25 MED ORDER — LACTATED RINGERS IV SOLN
INTRAVENOUS | Status: DC | PRN
  Administered 2019-09-25 (×2): via INTRAVENOUS

## 2019-09-25 MED ORDER — PROPOFOL 10 MG/ML IV BOLUS
INTRAVENOUS | Status: AC
  Filled 2019-09-25: qty 20

## 2019-09-25 MED ORDER — ONDANSETRON HCL 4 MG/2ML IJ SOLN
INTRAMUSCULAR | Status: AC
  Filled 2019-09-25: qty 2

## 2019-09-25 MED ORDER — OXYCODONE HCL 5 MG PO TABS: 5.0000 mg | ORAL_TABLET | Freq: Once | ORAL | Status: DC | PRN

## 2019-09-25 MED ORDER — LIDOCAINE-EPINEPHRINE 1 %-1:100000 IJ SOLN
INTRAMUSCULAR | Status: DC | PRN
  Administered 2019-09-25: 20 mL

## 2019-09-25 MED ORDER — FENTANYL CITRATE (PF) 250 MCG/5ML IJ SOLN
INTRAMUSCULAR | Status: AC
  Filled 2019-09-25: qty 5

## 2019-09-25 MED ORDER — PROMETHAZINE HCL 25 MG/ML IJ SOLN: 6.2500 mg | INTRAMUSCULAR | Status: DC | PRN

## 2019-09-25 MED ORDER — EPHEDRINE SULFATE-NACL 50-0.9 MG/10ML-% IV SOSY
PREFILLED_SYRINGE | INTRAVENOUS | Status: DC | PRN
  Administered 2019-09-25 (×3): 10 mg via INTRAVENOUS

## 2019-09-25 MED ORDER — FENTANYL CITRATE (PF) 100 MCG/2ML IJ SOLN
INTRAMUSCULAR | Status: DC | PRN
  Administered 2019-09-25: 50 ug via INTRAVENOUS
  Administered 2019-09-25: 25 ug via INTRAVENOUS

## 2019-09-25 MED ORDER — BUPIVACAINE HCL (PF) 0.25 % IJ SOLN
INTRAMUSCULAR | Status: DC | PRN
  Administered 2019-09-25: 20 mL

## 2019-09-25 MED ORDER — LIDOCAINE 2% (20 MG/ML) 5 ML SYRINGE
INTRAMUSCULAR | Status: DC | PRN
  Administered 2019-09-25: 60 mg via INTRAVENOUS

## 2019-09-25 MED ORDER — ONDANSETRON HCL 4 MG/2ML IJ SOLN
INTRAMUSCULAR | Status: DC | PRN
  Administered 2019-09-25: 4 mg via INTRAVENOUS

## 2019-09-25 MED ORDER — MIDAZOLAM HCL 2 MG/2ML IJ SOLN
INTRAMUSCULAR | Status: AC
  Filled 2019-09-25: qty 2

## 2019-09-25 MED ORDER — KETOROLAC TROMETHAMINE 30 MG/ML IJ SOLN: 30.0000 mg | Freq: Once | INTRAMUSCULAR | Status: DC | PRN

## 2019-09-25 MED ORDER — OXYCODONE HCL 5 MG/5ML PO SOLN: 5.0000 mg | Freq: Once | ORAL | Status: DC | PRN

## 2019-09-25 MED ORDER — FENTANYL CITRATE (PF) 100 MCG/2ML IJ SOLN
INTRAMUSCULAR | Status: AC
  Filled 2019-09-25: qty 2

## 2019-09-25 MED ORDER — OXYCODONE HCL 5 MG PO TABS: 5.0000 mg | ORAL_TABLET | Freq: Four times a day (QID) | ORAL | 0 refills | Status: DC | PRN

## 2019-09-25 SURGICAL SUPPLY — 43 items
BINDER BREAST LRG (GAUZE/BANDAGES/DRESSINGS) IMPLANT
BINDER BREAST XLRG (GAUZE/BANDAGES/DRESSINGS) ×2 IMPLANT
BNDG COHESIVE 4X5 TAN STRL (GAUZE/BANDAGES/DRESSINGS) ×2 IMPLANT
CANISTER SUCT 3000ML PPV (MISCELLANEOUS) ×2 IMPLANT
CHLORAPREP W/TINT 26 (MISCELLANEOUS) ×2 IMPLANT
CLIP VESOCCLUDE LG 6/CT (CLIP) ×2 IMPLANT
CLIP VESOCCLUDE MED 6/CT (CLIP) ×4 IMPLANT
CLIP VESOCCLUDE SM WIDE 6/CT (CLIP) ×2 IMPLANT
CONT SPEC 4OZ CLIKSEAL STRL BL (MISCELLANEOUS) ×8 IMPLANT
COVER PROBE W GEL 5X96 (DRAPES) ×2 IMPLANT
COVER SURGICAL LIGHT HANDLE (MISCELLANEOUS) ×2 IMPLANT
COVER WAND RF STERILE (DRAPES) ×2 IMPLANT
DERMABOND ADVANCED (GAUZE/BANDAGES/DRESSINGS) ×1
DERMABOND ADVANCED .7 DNX12 (GAUZE/BANDAGES/DRESSINGS) ×1 IMPLANT
DEVICE DUBIN SPECIMEN MAMMOGRA (MISCELLANEOUS) IMPLANT
DRAPE CHEST BREAST 15X10 FENES (DRAPES) ×2 IMPLANT
DRSG PAD ABDOMINAL 8X10 ST (GAUZE/BANDAGES/DRESSINGS) ×2 IMPLANT
ELECT COATED BLADE 2.86 ST (ELECTRODE) ×2 IMPLANT
ELECT NEEDLE BLADE 2-5/6 (NEEDLE) ×2 IMPLANT
ELECT REM PT RETURN 9FT ADLT (ELECTROSURGICAL) ×2
ELECTRODE REM PT RTRN 9FT ADLT (ELECTROSURGICAL) ×1 IMPLANT
GAUZE SPONGE 4X4 12PLY STRL (GAUZE/BANDAGES/DRESSINGS) ×2 IMPLANT
GLOVE BIO SURGEON STRL SZ 6 (GLOVE) ×2 IMPLANT
GLOVE INDICATOR 6.5 STRL GRN (GLOVE) ×2 IMPLANT
GOWN STRL REUS W/ TWL LRG LVL3 (GOWN DISPOSABLE) ×1 IMPLANT
GOWN STRL REUS W/TWL 2XL LVL3 (GOWN DISPOSABLE) ×2 IMPLANT
GOWN STRL REUS W/TWL LRG LVL3 (GOWN DISPOSABLE) ×1
KIT BASIN OR (CUSTOM PROCEDURE TRAY) ×2 IMPLANT
KIT MARKER MARGIN INK (KITS) ×2 IMPLANT
LIGHT WAVEGUIDE WIDE FLAT (MISCELLANEOUS) ×2 IMPLANT
NEEDLE 18GX1X1/2 (RX/OR ONLY) (NEEDLE) IMPLANT
NEEDLE FILTER BLUNT 18X 1/2SAF (NEEDLE)
NEEDLE FILTER BLUNT 18X1 1/2 (NEEDLE) IMPLANT
NEEDLE HYPO 25GX1X1/2 BEV (NEEDLE) ×2 IMPLANT
NS IRRIG 1000ML POUR BTL (IV SOLUTION) ×2 IMPLANT
PACK GENERAL/GYN (CUSTOM PROCEDURE TRAY) ×2 IMPLANT
PACK UNIVERSAL I (CUSTOM PROCEDURE TRAY) ×2 IMPLANT
STOCKINETTE IMPERVIOUS 9X36 MD (GAUZE/BANDAGES/DRESSINGS) ×2 IMPLANT
SUT MNCRL AB 4-0 PS2 18 (SUTURE) ×2 IMPLANT
SUT VIC AB 3-0 SH 8-18 (SUTURE) ×2 IMPLANT
SYR CONTROL 10ML LL (SYRINGE) ×2 IMPLANT
TOWEL GREEN STERILE (TOWEL DISPOSABLE) ×2 IMPLANT
TOWEL GREEN STERILE FF (TOWEL DISPOSABLE) ×2 IMPLANT

## 2019-09-25 NOTE — Op Note (Signed)
Left Breast Radioactive seed localized lumpectomy and sentinel lymph node biopsy  Indications: This patient presents with history of screening detected left breast cancer, cT1cN0  Pre-operative Diagnosis: left breast cancer, upper outer quadrant, cT1cN0, grade 2 invasive ductal carcinoma, +/+/-  Post-operative Diagnosis: Same  Surgeon: Stark Klein   Anesthesia: General endotracheal anesthesia  ASA Class: 2  Procedure Details  The patient was seen in the Holding Room. The risks, benefits, complications, treatment options, and expected outcomes were discussed with the patient. The possibilities of bleeding, infection, the need for additional procedures, failure to diagnose a condition, and creating a complication requiring transfusion or operation were discussed with the patient. The patient concurred with the proposed plan, giving informed consent.  The site of surgery properly noted/marked. The patient was taken to Operating Room # 2, identified, and the procedure verified as Left Breast seed localized Lumpectomy with sentinel lymph node biopsy.  A Time Out was held and the above information confirmed.  The left arm, breast, and chest were prepped and draped in standard fashion. The lumpectomy was performed by creating an axillary incision near the previously placed radioactive seed.  Dissection was carried down around the point of maximum signal intensity with the cautery.  Hemostasis was achieved with cautery. The edges of the cavity were marked with large clips, with one each medial, lateral, inferior and superior, and two clips posteriorly.   The specimen was inked with the margin marker paint kit.    Specimen radiography confirmed inclusion of the mammographic lesion, the clip, and the seed.  The background signal in the breast was zero.  The wound was irrigated and closed with 3-0 vicryl in layers and 4-0 monocryl subcuticular suture.    Using a hand-held gamma probe, left axillary sentinel  nodes were identified transcutaneously.  An oblique incision was created below the axillary hairline.  Dissection was carried through the clavipectoral fascia.  Five deep level two axillary sentinel nodes were removed.  Counts per second were 290, 65, 5500, 640, and 110.    The background count was 20 cps.  The wound was irrigated.  Hemostasis was achieved with cautery.  The axillary incision was closed with a 3-0 vicryl deep dermal interrupted sutures and a 4-0 monocryl subcuticular closure.    Sterile dressings were applied. At the end of the operation, all sponge, instrument, and needle counts were correct.  Findings: grossly clear surgical margins and no adenopathy, posterior margin is pectoralis, anterior margin is skin.    Estimated Blood Loss:  min         Specimens: left breast lumpectomy with seed and five left axillary sentinel lymph nodes.             Complications:  None; patient tolerated the procedure well.         Disposition: PACU - hemodynamically stable.         Condition: stable

## 2019-09-25 NOTE — Discharge Instructions (Addendum)
[facility] °Office Phone Number [phone] ° °BREAST BIOPSY/ PARTIAL MASTECTOMY: POST OP INSTRUCTIONS ° °Always review your discharge instruction sheet given to you by the facility where your surgery was performed. ° °IF YOU HAVE DISABILITY OR FAMILY LEAVE FORMS, YOU MUST BRING THEM TO THE OFFICE FOR PROCESSING.  DO NOT GIVE THEM TO YOUR DOCTOR. ° °1. A prescription for pain medication may be given to you upon discharge.  Take your pain medication as prescribed, if needed.  If narcotic pain medicine is not needed, then you may take acetaminophen (Tylenol) or ibuprofen (Advil) as needed. °2. Take your usually prescribed medications unless otherwise directed °3. If you need a refill on your pain medication, please contact your pharmacy.  They will contact our office to request authorization.  Prescriptions will not be filled after 5pm or on week-ends. °4. You should eat very light the first 24 hours after surgery, such as soup, crackers, pudding, etc.  Resume your normal diet the day after surgery. °5. Most patients will experience some swelling and bruising in the breast.  Ice packs and a good support bra will help.  Swelling and bruising can take several days to resolve.  °6. It is common to experience some constipation if taking pain medication after surgery.  Increasing fluid intake and taking a stool softener will usually help or prevent this problem from occurring.  A mild laxative (Milk of Magnesia or Miralax) should be taken according to package directions if there are no bowel movements after 48 hours. °7. Unless discharge instructions indicate otherwise, you may remove your bandages 48 hours after surgery, and you may shower at that time.  You may have steri-strips (small skin tapes) in place directly over the incision.  These strips should be left on the skin for 7-10 days.   Any sutures or staples will be removed at the office during your follow-up visit. °8. ACTIVITIES:  You may resume  regular daily activities (gradually increasing) beginning the next day.  Wearing a good support bra or sports bra (or the breast binder) minimizes pain and swelling.  You may have sexual intercourse when it is comfortable. °a. You may drive when you no longer are taking prescription pain medication, you can comfortably wear a seatbelt, and you can safely maneuver your car and apply brakes. °b. RETURN TO WORK:  __________1 week_______________ °9. You should see your doctor in the office for a follow-up appointment approximately two weeks after your surgery.  Your doctor’s nurse will typically make your follow-up appointment when she calls you with your pathology report.  Expect your pathology report 2-3 business days after your surgery.  You may call to check if you do not hear from us after three days. ° ° °WHEN TO CALL YOUR DOCTOR: °1. Fever over 101.0 °2. Nausea and/or vomiting. °3. Extreme swelling or bruising. °4. Continued bleeding from incision. °5. Increased pain, redness, or drainage from the incision. ° °The clinic staff is available to answer your questions during regular business hours.  Please don’t hesitate to call and ask to speak to one of the nurses for clinical concerns.  If you have a medical emergency, go to the nearest emergency room or call 911.  A surgeon from [facility] is always on call at the hospital. ° °For further questions, please visit [email or web address]  ° °

## 2019-09-25 NOTE — Anesthesia Preprocedure Evaluation (Addendum)
Anesthesia Evaluation  Patient identified by MRN, date of birth, ID band Patient awake    Reviewed: Allergy & Precautions, NPO status , Patient's Chart, lab work & pertinent test results  Airway Mallampati: II  TM Distance: >3 FB Neck ROM: Full    Dental no notable dental hx.    Pulmonary neg pulmonary ROS,    Pulmonary exam normal breath sounds clear to auscultation       Cardiovascular negative cardio ROS Normal cardiovascular exam Rhythm:Regular Rate:Normal     Neuro/Psych negative neurological ROS  negative psych ROS   GI/Hepatic negative GI ROS, Neg liver ROS,   Endo/Other  negative endocrine ROS  Renal/GU negative Renal ROS     Musculoskeletal negative musculoskeletal ROS (+)   Abdominal   Peds  Hematology negative hematology ROS (+)   Anesthesia Other Findings LEFT BREAST CANCER  Reproductive/Obstetrics                            Anesthesia Physical Anesthesia Plan  ASA: II  Anesthesia Plan: General and Regional   Post-op Pain Management: GA combined w/ Regional for post-op pain   Induction: Intravenous  PONV Risk Score and Plan: 3 and Ondansetron, Dexamethasone, Midazolam and Treatment may vary due to age or medical condition  Airway Management Planned: LMA  Additional Equipment:   Intra-op Plan:   Post-operative Plan: Extubation in OR  Informed Consent: I have reviewed the patients History and Physical, chart, labs and discussed the procedure including the risks, benefits and alternatives for the proposed anesthesia with the patient or authorized representative who has indicated his/her understanding and acceptance.     Dental advisory given  Plan Discussed with: CRNA  Anesthesia Plan Comments:        Anesthesia Quick Evaluation

## 2019-09-25 NOTE — Interval H&P Note (Signed)
History and Physical Interval Note:  09/25/2019 2:58 PM  Hannah Wallace  has presented today for surgery, with the diagnosis of LEFT BREAST CANCER.  The various methods of treatment have been discussed with the patient and family. After consideration of risks, benefits and other options for treatment, the patient has consented to  Procedure(s): LEFT BREAST LUMPECTOMY WITH RADIOACTIVE SEED AND SENTINEL LYMPH NODE BIOPSY (Left) as a surgical intervention.  The patient's history has been reviewed, patient examined, no change in status, stable for surgery.  I have reviewed the patient's chart and labs.  Questions were answered to the patient's satisfaction.     Stark Klein

## 2019-09-25 NOTE — Anesthesia Procedure Notes (Signed)
Anesthesia Regional Block: Pectoralis block   Pre-Anesthetic Checklist: ,, timeout performed, Correct Patient, Correct Site, Correct Laterality, Correct Procedure, Correct Position, site marked, Risks and benefits discussed,  Surgical consent,  Pre-op evaluation,  At surgeon's request and post-op pain management  Laterality: Left  Prep: chloraprep       Needles:  Injection technique: Single-shot  Needle Type: Echogenic Stimulator Needle     Needle Length: 10cm  Needle Gauge: 21     Additional Needles:   Procedures:,,,, ultrasound used (permanent image in chart),,,,  Narrative:  Start time: 09/25/2019 1:50 PM End time: 09/25/2019 2:00 PM Injection made incrementally with aspirations every 5 mL.  Performed by: Personally  Anesthesiologist: Murvin Natal, MD  Additional Notes: Functioning IV was confirmed and monitors were applied.  A timeout was performed. Sterile prep, hand hygiene and sterile gloves were used. A 122mm 21ga [place] echogenic stimulator needle was used. Negative aspiration and negative test dose prior to incremental administration of local anesthetic. The patient tolerated the procedure well.  Ultrasound guidance: relevent anatomy identified, needle position confirmed, local anesthetic spread visualized around nerve(s), vascular puncture avoided.  Image printed for medical record.

## 2019-09-25 NOTE — Anesthesia Postprocedure Evaluation (Signed)
Anesthesia Post Note  Patient: Hannah Wallace  Procedure(s) Performed: LEFT BREAST LUMPECTOMY WITH RADIOACTIVE SEED AND SENTINEL LYMPH NODE BIOPSY (Left Breast)     Patient location during evaluation: PACU Anesthesia Type: Regional and General Level of consciousness: awake and alert Pain management: pain level controlled Vital Signs Assessment: post-procedure vital signs reviewed and stable Respiratory status: spontaneous breathing, nonlabored ventilation, respiratory function stable and patient connected to nasal cannula oxygen Cardiovascular status: blood pressure returned to baseline and stable Postop Assessment: no apparent nausea or vomiting Anesthetic complications: no    Last Vitals:  Vitals:   09/25/19 1736 09/25/19 1744  BP: (!) 113/95 120/68  Pulse: 92 91  Resp: 12 12  Temp: (!) 36.1 C   SpO2: 94% 96%    Last Pain:  Vitals:   09/25/19 1736  TempSrc:   PainSc: 0-No pain                 Maye Parkinson P Lauris Keepers

## 2019-09-25 NOTE — Transfer of Care (Signed)
Immediate Anesthesia Transfer of Care Note  Patient: Hannah Wallace  Procedure(s) Performed: LEFT BREAST LUMPECTOMY WITH RADIOACTIVE SEED AND SENTINEL LYMPH NODE BIOPSY (Left Breast)  Patient Location: PACU  Anesthesia Type:GA combined with regional for post-op pain  Level of Consciousness: awake, alert  and oriented  Airway & Oxygen Therapy: Patient Spontanous Breathing and Patient connected to nasal cannula oxygen  Post-op Assessment: Report given to RN, Post -op Vital signs reviewed and stable and Patient moving all extremities  Post vital signs: Reviewed and stable  Last Vitals:  Vitals Value Taken Time  BP 110/60 09/25/19 1716  Temp 36.6 C 09/25/19 1716  Pulse 94 09/25/19 1722  Resp 12 09/25/19 1722  SpO2 99 % 09/25/19 1722  Vitals shown include unvalidated device data.  Last Pain:  Vitals:   09/25/19 1716  TempSrc:   PainSc: 0-No pain      Patients Stated Pain Goal: 3 ([id number])  Complications: No apparent anesthesia complications

## 2019-09-25 NOTE — Anesthesia Procedure Notes (Signed)
Procedure Name: LMA Insertion Date/Time: 09/25/2019 3:48 PM Performed by: Mae Cianci T, CRNA Pre-anesthesia Checklist: Patient identified, Emergency Drugs available, Suction available and Patient being monitored Patient Re-evaluated:Patient Re-evaluated prior to induction Oxygen Delivery Method: Circle system utilized Preoxygenation: Pre-oxygenation with 100% oxygen Induction Type: IV induction LMA: LMA inserted LMA Size: 4.0 Number of attempts: 1 Airway Equipment and Method: Patient positioned with wedge pillow Placement Confirmation: positive ETCO2 and breath sounds checked- equal and bilateral Tube secured with: Tape Dental Injury: Teeth and Oropharynx as per pre-operative assessment

## 2019-09-26 ENCOUNTER — Encounter (HOSPITAL_COMMUNITY): Payer: Self-pay | Admitting: General Surgery

## 2019-09-29 LAB — SURGICAL PATHOLOGY

## 2019-09-29 NOTE — Progress Notes (Signed)
Please let patient know margins and LN are negative.

## 2019-09-30 ENCOUNTER — Telehealth: Payer: Self-pay | Admitting: *Deleted

## 2019-09-30 NOTE — Telephone Encounter (Signed)
Ordered oncotype per Dr. Gudena. Faxed requisition to pathology. °

## 2019-10-05 NOTE — Progress Notes (Signed)
Wall[facility] Care Team: Joella Prince as PCP - General (Physician Assistant) Mauro Kaufmann, RN as Oncology Nurse Navigator Rockwell Germany, RN as Oncology Nurse Navigator  DIAGNOSIS:    ICD-10-CM   1. Malignant neoplasm of upper-outer quadrant of left breast in female, estrogen receptor positive ([place])  C50.412    Z17.0     SUMMARY OF ONCOLOGIC HISTORY: Oncology History  Malignant neoplasm of upper-outer quadrant of left breast in female, estrogen receptor positive ([place])  09/03/2019 Initial Diagnosis   Routine screening mammogram detected a 1.4cm indeterminate left breast mass. US showed a 1.0cm left breast mass and a 0.5cm lymph node with cortical thickening at Hannah 2:00 position in Hannah left breast. Biopsy showed IDC, grade 1, HER-2 - by FISH, ER+ 90%, PR+ 100%, Ki67 2%, with no malignancy in Hannah lymph node.   09/10/2019 Cancer Staging   Staging form: Breast, AJCC 8th Edition - Clinical stage from 09/10/2019: Stage IA (cT1c, cN0, cM0, G2, ER+, PR+, HER2-) - Signed by Hannah Lose, MD on 09/10/2019   09/25/2019 Surgery   Left lumpectomy [facility]): IDC, grade 2, 1.2cm, with intermediate grade DCIS, 5 axillary lymph nodes negative, clear margins.      CHIEF COMPLIANT: Follow-up s/p lumpectomy to review pathology report  INTERVAL HISTORY: Hannah Wallace is a 63 y.o. with above-mentioned history of left breast cancer. She underwent a lumpectomy on 09/25/19 with Dr. 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 She presents to Hannah clinic today to discuss Hannah pathology report and further treatment.   REVIEW OF SYSTEMS:   Constitutional: Denies fevers, chills or abnormal weight loss Eyes: Denies blurriness of vision Ears, nose, mouth, throat, and face: Denies mucositis or sore throat Respiratory: Denies cough, dyspnea or wheezes Cardiovascular: Denies palpitation,  chest discomfort Gastrointestinal: Denies nausea, heartburn or change in bowel habits Skin: Denies abnormal skin rashes Lymphatics: Denies new lymphadenopathy or easy bruising Neurological: Denies numbness, tingling or new weaknesses Behavioral/Psych: Mood is stable, no new changes  Extremities: No lower extremity edema Breast: denies any pain or lumps or nodules in either breasts All other systems were reviewed with Hannah Wallace and are negative.  I have reviewed Hannah past medical history, past surgical history, social history and family history with Hannah Wallace and they are unchanged from previous note.  ALLERGIES:  has No Known Allergies.  MEDICATIONS:  Current Outpatient Medications  Medication Sig Dispense Refill  . b complex vitamins capsule Take 1 capsule by mouth daily.    . Multiple Vitamin (MULTIVITAMIN) tablet Take 1 tablet by mouth daily.    . Omega-3 Fatty Acids (FISH OIL) 1000 MG CAPS Take 1,000 mg by mouth 2 (two) times daily.    Marland Kitchen oxyCODONE (OXY IR/ROXICODONE) 5 MG immediate release tablet Take 1 tablet (5 mg total) by mouth every 6 (six) hours as needed for severe pain. 10 tablet 0  . Polyethyl Glycol-Propyl Glycol (SYSTANE) 0.4-0.3 % SOLN Place 1 drop into both eyes daily.    Marland Kitchen UNABLE TO FIND [employer] Thyroid Complete    . vitamin C (ASCORBIC ACID) 500 MG tablet Take 500 mg by mouth daily.    . Vitamin D-Vitamin K (VITAMIN K2-VITAMIN D3 PO) Take 1 tablet by mouth daily. D3 125 mcg K2 90 mcg     No current facility-administered medications for this visit.     PHYSICAL EXAMINATION: ECOG PERFORMANCE STATUS: 1 - Symptomatic but  completely ambulatory  Vitals:   10/06/19 1154  BP: 128/67  Pulse: 91  Resp: 17  Temp: 98.7 F (37.1 C)  SpO2: 95%   Filed Weights   10/06/19 1154  Weight: 207 lb 3.2 oz (94 kg)    GENERAL: alert, no distress and comfortable SKIN: skin color, texture, turgor are normal, no rashes or significant lesions EYES: normal, Conjunctiva  are pink and non-injected, sclera clear OROPHARYNX: no exudate, no erythema and lips, buccal mucosa, and tongue normal  NECK: supple, thyroid normal size, non-tender, without nodularity LYMPH: no palpable lymphadenopathy in Hannah cervical, axillary or inguinal LUNGS: clear to auscultation and percussion with normal breathing effort HEART: regular rate & rhythm and no murmurs and no lower extremity edema ABDOMEN: abdomen soft, non-tender and normal bowel sounds MUSCULOSKELETAL: no cyanosis of digits and no clubbing  NEURO: alert & oriented x 3 with fluent speech, no focal motor/sensory deficits EXTREMITIES: No lower extremity edema  LABORATORY DATA:  I have reviewed Hannah data as listed CMP Latest Ref Rng & Units 09/10/2019  Glucose 70 - 99 mg/dL 85  BUN 8 - 23 mg/dL 18  Creatinine 0.44 - 1.00 mg/dL 0.87  Sodium 135 - 145 mmol/L 140  Potassium 3.5 - 5.1 mmol/L 4.1  Chloride 98 - 111 mmol/L 104  CO2 22 - 32 mmol/L 25  Calcium 8.9 - 10.3 mg/dL 9.5  Total Protein 6.5 - 8.1 g/dL 7.6  Total Bilirubin 0.3 - 1.2 mg/dL 0.5  Alkaline Phos 38 - 126 U/L 69  AST 15 - 41 U/L 19  ALT 0 - 44 U/L 47(H)    Lab Results  Component Value Date   WBC 9.2 09/22/2019   HGB 15.3 (H) 09/22/2019   HCT 45.9 09/22/2019   MCV 89.8 09/22/2019   PLT 242 09/22/2019   NEUTROABS 4.1 09/10/2019    ASSESSMENT & PLAN:  Malignant neoplasm of upper-outer quadrant of left breast in female, estrogen receptor positive ([place]) 09/03/2019: Routine screening mammogram detected a 1.4cm indeterminate left breast mass. US showed a 1.0cm left breast mass and a 0.5cm lymph node with cortical thickening at Hannah 2:00 position in Hannah left breast. Biopsy showed IDC, grade 1, HER-2 - by FISH, ER+ 90%, PR+ 100%, Ki67 2%, with no malignancy in Hannah lymph node. T1c N0 stage Ia clinical stage  09/25/2019:Left lumpectomy [facility]): IDC, grade 2, 1.2cm, with intermediate grade DCIS, 5 axillary lymph nodes negative, clear margins. HER-2 - by  FISH, ER+ 90%, PR+ 100%, Ki67 2%,   Pathology counseling: I discussed Hannah final pathology report of Hannah Wallace provided  a copy of this report. I discussed Hannah margins as well as lymph node surgeries. We also discussed Hannah final staging along with previously performed ER/PR and HER-2/neu testing.  Treatment plan: 1. Oncotype DX testing to determine if chemotherapy would be of any benefit followed by 2. Adjuvant radiation therapy followed by 3. Adjuvant antiestrogen therapy  Return to clinic based upon Oncotype test results     No orders of Hannah defined types were placed in this encounter.  Hannah Wallace has a good understanding of Hannah overall plan. she agrees with it. she will call with any problems that may develop before Hannah next visit here.  Hannah Lose, MD 10/06/2019  Julious Oka Dorshimer, am acting as scribe for Dr. Nicholas Wallace.  I have reviewed Hannah above documentation for accuracy and completeness, and I agree with Hannah above.

## 2019-10-06 ENCOUNTER — Other Ambulatory Visit: Payer: Self-pay

## 2019-10-06 ENCOUNTER — Inpatient Hospital Stay (HOSPITAL_BASED_OUTPATIENT_CLINIC_OR_DEPARTMENT_OTHER): Payer: BC Managed Care – PPO | Admitting: Hematology and Oncology

## 2019-10-06 DIAGNOSIS — Z170 Estrogen receptor positive status [ER+]: Secondary | ICD-10-CM | POA: Diagnosis not present

## 2019-10-06 DIAGNOSIS — C50412 Malignant neoplasm of upper-outer quadrant of left female breast: Secondary | ICD-10-CM | POA: Diagnosis not present

## 2019-10-06 NOTE — Assessment & Plan Note (Signed)
09/03/2019: Routine screening mammogram detected a 1.4cm indeterminate left breast mass. US showed a 1.0cm left breast mass and a 0.5cm lymph node with cortical thickening at the 2:00 position in the left breast. 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 T1c N0 stage Ia clinical stage  09/25/2019:Left lumpectomy [facility]): IDC, grade 2, 1.2cm, with intermediate grade DCIS, 5 axillary lymph nodes negative, clear margins. HER-2 - by FISH, ER+ 90%, PR+ 100%, Ki67 2%,   Pathology counseling: I discussed the final pathology report of the patient provided  a copy of this report. I discussed the margins as well as lymph node surgeries. We also discussed the final staging along with previously performed ER/PR and HER-2/neu testing.  Treatment plan: 1. Oncotype DX testing to determine if chemotherapy would be of any benefit followed by 2. Adjuvant radiation therapy followed by 3. Adjuvant antiestrogen therapy  Return to clinic based upon Oncotype test results

## 2019-10-07 ENCOUNTER — Encounter: Payer: Self-pay | Admitting: *Deleted

## 2019-10-20 ENCOUNTER — Ambulatory Visit: Payer: BC Managed Care – PPO | Admitting: Physical Therapy

## 2019-10-27 ENCOUNTER — Encounter: Payer: Self-pay | Admitting: Physical Therapy

## 2019-10-27 ENCOUNTER — Ambulatory Visit: Payer: BC Managed Care – PPO | Attending: General Surgery | Admitting: Physical Therapy

## 2019-10-27 ENCOUNTER — Other Ambulatory Visit: Payer: Self-pay

## 2019-10-27 DIAGNOSIS — Z170 Estrogen receptor positive status [ER+]: Secondary | ICD-10-CM | POA: Insufficient documentation

## 2019-10-27 DIAGNOSIS — R293 Abnormal posture: Secondary | ICD-10-CM

## 2019-10-27 DIAGNOSIS — R600 Localized edema: Secondary | ICD-10-CM | POA: Diagnosis present

## 2019-10-27 DIAGNOSIS — Z483 Aftercare following surgery for neoplasm: Secondary | ICD-10-CM | POA: Diagnosis present

## 2019-10-27 DIAGNOSIS — C50412 Malignant neoplasm of upper-outer quadrant of left female breast: Secondary | ICD-10-CM | POA: Insufficient documentation

## 2019-10-27 DIAGNOSIS — M25612 Stiffness of left shoulder, not elsewhere classified: Secondary | ICD-10-CM | POA: Insufficient documentation

## 2019-10-27 NOTE — Patient Instructions (Addendum)
Closed Chain: Shoulder Abduction / Adduction - on Wall    One hand on wall, step to side and return. Stepping causes shoulder to abduct and adduct. Step _5__ times, holding 5 seconds, _2-3__ times per day.  http://ss.exer.us/267   Copyright  VHI. All rights reserved.  MEDIAN NERVE: Mobilization XI    Stand with right palm flat on wall, fingers back, elbow bent, head tilted away. Sidestep away from wall, straightening elbow. Do 5 repetitions, holding 5 seconds.  Do _2__ sessions per day.  Copyright  VHI. All rights reserved.    Axillary web syndrome (also called cording) can happen after having breast cancer surgery when lymph nodes in the armpit are removed. It presents as if you have a thin cord in your arm and can run from the armpit all the way down into the forearm. If you've had a sentinel node biopsy, the risk is 1-20% and if you've had an axillary lymph node dissection (more than 7 nodes removed), the risk is 36-72%. The ranges vary depending on the research study.  It most often happens 3-4 weeks post-op but can happen sooner or later. There are several possibilities for what cording actually is. Although no one knows for sure as of yet, it may be related to lymphatics, veins, or other tissue. Sometimes cording resolves on its own but other times it requires physical therapy with a therapist who specializes in lymphedema and/or cancer rehab. Treatment typically involves stretching, manual techniques, and exercise. Sometimes cords get "released" while stretching or during manual treatment and the patient may experience the sensation of a "pop." This may feel strange but it is not dangerous and is a sign that the cord has released; range of motion may be improved in the process.

## 2019-10-27 NOTE — Therapy (Signed)
[place], [place], [address] Phone: [phone]   Fax:  [phone]  Physical Therapy Treatment  Patient Details  Name: Hannah Wallace MRN: [id number] Date of Birth: 06/28/1956 Referring Provider (PT): Dr. Stark Klein   Encounter Date: 10/27/2019  PT End of Session - 10/27/19 1342    Visit Number  2    Number of Visits  10    Date for PT Re-Evaluation  11/24/19    PT Start Time  1300    PT Stop Time  1342    PT Time Calculation (min)  42 min    Activity Tolerance  Patient tolerated treatment well    Behavior During Therapy  [facility] for tasks assessed/performed       Past Medical History:  Diagnosis Date  . Basal cell carcinoma    on back  . Cancer [facility])    breast cancer    Past Surgical History:  Procedure Laterality Date  . BREAST LUMPECTOMY WITH RADIOACTIVE SEED AND SENTINEL LYMPH NODE BIOPSY Left 09/25/2019   Procedure: LEFT BREAST LUMPECTOMY WITH RADIOACTIVE SEED AND SENTINEL LYMPH NODE BIOPSY;  Surgeon: Stark Klein, MD;  Location: [place];  Service: General;  Laterality: Left;  . COLONOSCOPY      There were no vitals filed for this visit.  Subjective Assessment - 10/27/19 1300    Subjective  Patient underwent a left lumpectomy and 5 negative nodes removed on 09/25/2019. She is awaiting Oncotype test results but will at minimum have radiation and anti-estrogen therapy.    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 Patient underwent a left lumpectomy and 5 negative nodes removed on 09/25/2019. It is ER/PR positive and HER2 negative with a Ki67 of 2%.    Patient Stated Goals  See if my arm is ok and get rid of my cording    Currently in Pain?  Yes    Pain Score  1     Pain Location  Arm    Pain Orientation  Left;Medial;Upper    Pain Descriptors / Indicators  Sore    Pain Type  Surgical pain    Pain Onset  1 to 4  weeks ago    Pain Frequency  Intermittent    Aggravating Factors   Using arm    Pain Relieving Factors  Rest    Multiple Pain Sites  No         [facility] PT Assessment - 10/27/19 0001      Assessment   Medical Diagnosis  s/p left lumpectomy and SLNB    Referring Provider (PT)  Dr. Stark Klein    Onset Date/Surgical Date  09/25/19    Hand Dominance  Right    Prior Therapy  Baseline assessment      Precautions   Precautions  Other (comment)    Precaution Comments  recent surgery and left arm lymphedema risk      Restrictions   Weight Bearing Restrictions  No      Balance Screen   Has the patient fallen in the past 6 months  No    Has the patient had a decrease in activity level because of a fear of falling?   No    Is the patient reluctant to leave their home because of a fear of falling?   No      Home Film/video editor residence    Living Arrangements  Alone    Available Help at Discharge  Family      Prior Function   Level of Independence  Independent    Vocation  Full time employment    Midwife    Leisure  She has returned to walking and is walking 30 min/day 7 days per week      Cognition   Overall Cognitive Status  Within Functional Limits for tasks assessed      Observation/Other Assessments   Observations  Axillary cording present in axilla to upper medial upper arm. Left superior breast appears to have a mild seroma so compression foam was placed and a compression bra recommended. One incision present and appears to be healing well.       Posture/Postural Control   Posture/Postural Control  Postural limitations    Postural Limitations  Rounded Shoulders;Forward head      ROM / Strength   AROM / PROM / Strength  AROM      AROM   AROM Assessment Site  Shoulder    Right/Left Shoulder  Left    Left Shoulder Extension  54 Degrees    Left Shoulder Flexion  152 Degrees    Left Shoulder ABduction  157 Degrees     Left Shoulder Internal Rotation  71 Degrees    Left Shoulder External Rotation  90 Degrees      Strength   Overall Strength  Within functional limits for tasks performed        LYMPHEDEMA/ONCOLOGY QUESTIONNAIRE - 10/27/19 1307      Type   Cancer Type  Left breast cancer      Surgeries   Lumpectomy Date  09/25/19    Sentinel Lymph Node Biopsy Date  09/25/19    Number Lymph Nodes Removed  5      Treatment   Active Chemotherapy Treatment  No    Past Chemotherapy Treatment  No    Active Radiation Treatment  No    Past Radiation Treatment  No    Current Hormone Treatment  No    Past Hormone Therapy  No      What other symptoms do you have   Are you Having Heaviness or Tightness  Yes    Are you having Pain  Yes    Are you having pitting edema  No    Is it Hard or Difficult finding clothes that fit  No    Do you have infections  No    Is there Decreased scar mobility  No    Stemmer Sign  No      Lymphedema Assessments   Lymphedema Assessments  Upper extremities      Right Upper Extremity Lymphedema   10 cm Proximal to Olecranon Process  31.6 cm    Olecranon Process  28 cm    10 cm Proximal to Ulnar Styloid Process  24.3 cm    Just Proximal to Ulnar Styloid Process  17.5 cm    Across Hand at [employer]  19.7 cm    At [place] of 2nd Digit  6.6 cm      Left Upper Extremity Lymphedema   10 cm Proximal to Olecranon Process  32.9 cm    Olecranon Process  28.5 cm    10 cm Proximal to Ulnar Styloid Process  24.1 cm    Just Proximal to Ulnar Styloid Process  17.6 cm    Across Hand at [employer]  19.1 cm  At [facility] of 2nd Digit  6.3 cm        Hannah Wallace - 10/27/19 0001    Open a tight or new jar  No difficulty    Do heavy household chores (wash walls, wash floors)  Mild difficulty    Carry a shopping bag or briefcase  No difficulty    Wash your back  No difficulty    Use a knife to cut food  No difficulty    Recreational activities in which you take some  force or impact through your arm, shoulder, or hand (golf, hammering, tennis)  Mild difficulty    During the past week, to what extent has your arm, shoulder or hand problem interfered with your normal social activities with family, friends, neighbors, or groups?  Slightly    During the past week, to what extent has your arm, shoulder or hand problem limited your work or other regular daily activities  Not at all    Arm, shoulder, or hand pain.  Mild    Tingling (pins and needles) in your arm, shoulder, or hand  None    Difficulty Sleeping  Mild difficulty    DASH Score  11.36 %                     PT Education - 10/27/19 1327    Education Details  Shoulder abduction and neural stretching    Person(s) Educated  Patient    Methods  Explanation;Demonstration;Handout    Comprehension  Returned demonstration;Verbalized understanding          PT Long Term Goals - 10/27/19 1347      PT LONG TERM GOAL #1   Title  Patient will demonstrate she has regained full shoulder ROM and function post operatively compared to bsaeline assessment.    Time  4    Period  Weeks    Status  On-going      PT LONG TERM GOAL #2   Title  Patient will improve shoulder abduction to be >/= 164 for increased ease reaching overhead and to return to baseline.    Baseline  157 post op; 164 pre-op    Time  4    Period  Weeks    Status  New    Target Date  11/24/19      PT LONG TERM GOAL #3   Title  Patient will improve DASH score to 0 (zero) for improved shoulder function and to return to baseline function.    Baseline  11.36 post op; 0 pre-op    Time  4    Period  Weeks    Status  New    Target Date  11/24/19      PT LONG TERM GOAL #4   Title  Patient will be independent with performing self manual lymph drainage for left breast.    Time  4    Period  Weeks    Status  New    Target Date  11/24/19            Plan - 10/27/19 1342    Clinical Impression Statement  Patient is doing  very well s/p left lumpectomy and sentinel node biopsy. She is awaiting Oncotype results but will undergo radiation and anti-estrogen therapy. She has a seroma preesnt in her left superior breast near her incision site which has hardened and would benefit from manual lymph drainage. She also has some mild axillary cording but reports it has improved when she  felt a pop in her axilla. Her shoulder ROM is nearly at baseline and will likely continue to improve with her new HEP. She will benefit from PT to reduce breast edema and return to baseline. She already attended the After Breast Cancer class and is planning to purchase a compression bra today.    Rehab Potential  Excellent    PT Frequency  2x / week    PT Duration  4 weeks    PT Treatment/Interventions  ADLs/Self Care Home Management;Therapeutic exercise;Patient/family education;Manual techniques;Manual lymph drainage;Passive range of motion;Scar mobilization;Therapeutic activities    PT Next Visit Plan  Begin breast manual lymph drainage and instruct pt if time permits    PT Home Exercise Plan  Post op shoulder ROM HEP - encouraged her to do hands behind head and AAROM flexion both in supine; added closed chain abduction and neural stretching to imrpove cording    Recommended Other Services  Compression bra; compression sleeve for flying?    Consulted and Agree with Plan of Care  Patient       Patient will benefit from skilled therapeutic intervention in order to improve the following deficits and impairments:  Decreased knowledge of precautions, Impaired UE functional use, Pain, Postural dysfunction, Decreased range of motion, Increased edema  Visit Diagnosis: Abnormal posture - Plan: PT plan of care cert/re-cert  Malignant neoplasm of upper-outer quadrant of left breast in female, estrogen receptor positive ([place]) - Plan: PT plan of care cert/re-cert  Stiffness of left shoulder, not elsewhere classified - Plan: PT plan of care  cert/re-cert  Localized edema - Plan: PT plan of care cert/re-cert  Aftercare following surgery for neoplasm - Plan: PT plan of care cert/re-cert     Problem List Patient Active Problem List   Diagnosis Date Noted  . Malignant neoplasm of upper-outer quadrant of left breast in female, estrogen receptor positive ([place]) 09/09/2019   Annia Friendly, PT 10/27/19 1:51 PM  [place], [place], [address] Phone: [phone]   Fax:  [phone]  Name: Hannah Wallace MRN: [id number] Date of Birth: 1956/10/29

## 2019-10-28 ENCOUNTER — Encounter: Payer: Self-pay | Admitting: Physical Therapy

## 2019-10-28 ENCOUNTER — Ambulatory Visit: Payer: BC Managed Care – PPO | Admitting: Physical Therapy

## 2019-10-28 DIAGNOSIS — R293 Abnormal posture: Secondary | ICD-10-CM | POA: Diagnosis not present

## 2019-10-28 DIAGNOSIS — Z483 Aftercare following surgery for neoplasm: Secondary | ICD-10-CM

## 2019-10-28 DIAGNOSIS — R600 Localized edema: Secondary | ICD-10-CM

## 2019-10-28 NOTE — Therapy (Signed)
[place], [place], [address] Phone: [phone]   Fax:  [phone]  Physical Therapy Treatment  Patient Details  Name: Hannah Wallace MRN: [id number] Date of Birth: [birth year] Referring Provider (PT): Dr. Stark Klein   Encounter Date: 10/28/2019  PT End of Session - 10/28/19 0854    Visit Number  3    Number of Visits  10    Date for PT Re-Evaluation  11/24/19    PT Start Time  0805    PT Stop Time  0854    PT Time Calculation (min)  49 min    Activity Tolerance  Patient tolerated treatment well    Behavior During Therapy  [facility] for tasks assessed/performed       Past Medical History:  Diagnosis Date  . Basal cell carcinoma    on back  . Cancer [facility])    breast cancer    Past Surgical History:  Procedure Laterality Date  . BREAST LUMPECTOMY WITH RADIOACTIVE SEED AND SENTINEL LYMPH NODE BIOPSY Left 09/25/2019   Procedure: LEFT BREAST LUMPECTOMY WITH RADIOACTIVE SEED AND SENTINEL LYMPH NODE BIOPSY;  Surgeon: Stark Klein, MD;  Location: [place];  Service: General;  Laterality: Left;  . COLONOSCOPY      There were no vitals filed for this visit.  Subjective Assessment - 10/28/19 0805    Subjective  My swelling is there. I went and bought a new bra yesterday so I want you to check it.    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 Patient underwent a left lumpectomy and 5 negative nodes removed on 09/25/2019. It is ER/PR positive and HER2 negative with a Ki67 of 2%.    Patient Stated Goals  See if my arm is ok and get rid of my cording    Currently in Pain?  No/denies    Pain Score  0-No pain                       [facility] Adult PT Treatment/Exercise - 10/28/19 0001      Manual Therapy   Manual Therapy  Manual Lymphatic Drainage (MLD);Edema management    Edema Management  created foam chip pack for pt to  wear in sports bra to soften seroma    Manual Lymphatic Drainage (MLD)  In supine: short neck, superficial and deep abdominals, left inguinal nodes and establishment of axillo inguinal pathway, right axillary nodes and establishment of interaxillary pathway, left breast moving fluid towards pathways with focus on superior lateral breast in area of seroma- instructed pt throughout in physiology of lymphatic system, correct technique and sequence and issued handout                  PT Long Term Goals - 10/27/19 1347      PT LONG TERM GOAL #1   Title  Patient will demonstrate she has regained full shoulder ROM and function post operatively compared to bsaeline assessment.    Time  4    Period  Weeks    Status  On-going      PT LONG TERM GOAL #2   Title  Patient will improve shoulder abduction to be >/= 164 for increased ease reaching overhead and to return to baseline.    Baseline  157 post op; 164 pre-op    Time  4    Period  Weeks    Status  New    Target Date  11/24/19      PT LONG TERM GOAL #3   Title  Patient will improve DASH score to 0 (zero) for improved shoulder function and to return to baseline function.    Baseline  11.36 post op; 0 pre-op    Time  4    Period  Weeks    Status  New    Target Date  11/24/19      PT LONG TERM GOAL #4   Title  Patient will be independent with performing self manual lymph drainage for left breast.    Time  4    Period  Weeks    Status  New    Target Date  11/24/19            Plan - 10/28/19 0854    Clinical Impression Statement  Began MLD to left breast today with focus over area of seroma in superior lateral breast. Instructed pt throughout in basic physiology of lymphatic system and correct technique and sequence. Had pt occasionally demonstrate correct skin stretch. Issued handout for self MLD at end of session. Created chip pack for pt to wear in bra that she purchased from [place]. Bra does have good coverage but still  encouraged pt to get a compression bra whenever she can.    PT Frequency  2x / week    PT Duration  4 weeks    PT Treatment/Interventions  ADLs/Self Care Home Management;Therapeutic exercise;Patient/family education;Manual techniques;Manual lymph drainage;Passive range of motion;Scar mobilization;Therapeutic activities    PT Next Visit Plan  assess indep with breast manual lymph drainage, see if chip pack helped, work on cording in LUE especially near antecubital fossa    PT Home Exercise Plan  Post op shoulder ROM HEP - encouraged her to do hands behind head and AAROM flexion both in supine; added closed chain abduction and neural stretching to imrpove cording    Consulted and Agree with Plan of Care  Patient       Patient will benefit from skilled therapeutic intervention in order to improve the following deficits and impairments:  Decreased knowledge of precautions, Impaired UE functional use, Pain, Postural dysfunction, Decreased range of motion, Increased edema  Visit Diagnosis: Localized edema  Aftercare following surgery for neoplasm     Problem List Patient Active Problem List   Diagnosis Date Noted  . Malignant neoplasm of upper-outer quadrant of left breast in female, estrogen receptor positive ([place]) 09/09/2019    [facility] 10/28/2019, 8:59 AM  [place], [place], [address] Phone: [phone]   Fax:  [phone]  Name: Othell Diluzio MRN: [id number] Date of Birth: [birth year]  Manus Gunning, PT 10/28/19 8:59 AM

## 2019-10-28 NOTE — Patient Instructions (Signed)

## 2019-10-30 ENCOUNTER — Ambulatory Visit: Payer: BC Managed Care – PPO

## 2019-10-30 ENCOUNTER — Other Ambulatory Visit: Payer: Self-pay

## 2019-10-30 DIAGNOSIS — R293 Abnormal posture: Secondary | ICD-10-CM

## 2019-10-30 DIAGNOSIS — Z483 Aftercare following surgery for neoplasm: Secondary | ICD-10-CM

## 2019-10-30 DIAGNOSIS — Z170 Estrogen receptor positive status [ER+]: Secondary | ICD-10-CM

## 2019-10-30 DIAGNOSIS — M25612 Stiffness of left shoulder, not elsewhere classified: Secondary | ICD-10-CM

## 2019-10-30 DIAGNOSIS — C50412 Malignant neoplasm of upper-outer quadrant of left female breast: Secondary | ICD-10-CM

## 2019-10-30 DIAGNOSIS — R600 Localized edema: Secondary | ICD-10-CM

## 2019-10-30 NOTE — Therapy (Signed)
[place], [place], [address] Phone: [phone]   Fax:  [phone]  Physical Therapy Treatment  Patient Details  Name: Hannah Wallace MRN: [id number] Date of Birth: [birth year] Referring Provider (PT): Dr. Stark Klein   Encounter Date: 10/30/2019  PT End of Session - 10/30/19 0801    Visit Number  4    Number of Visits  10    Date for PT Re-Evaluation  11/24/19    PT Start Time  0800    PT Stop Time  4854    PT Time Calculation (min)  55 min    Activity Tolerance  Patient tolerated treatment well    Behavior During Therapy  [facility] for tasks assessed/performed       Past Medical History:  Diagnosis Date  . Basal cell carcinoma    on back  . Cancer [facility])    breast cancer    Past Surgical History:  Procedure Laterality Date  . BREAST LUMPECTOMY WITH RADIOACTIVE SEED AND SENTINEL LYMPH NODE BIOPSY Left 09/25/2019   Procedure: LEFT BREAST LUMPECTOMY WITH RADIOACTIVE SEED AND SENTINEL LYMPH NODE BIOPSY;  Surgeon: Stark Klein, MD;  Location: [place];  Service: General;  Laterality: Left;  . COLONOSCOPY      There were no vitals filed for this visit.  Subjective Assessment - 10/30/19 0802    Subjective  Pt reports that she has received her compression bra but feels that it is too loose. She loves the chip pack and states that it has worked wonders. and her breast is feeling much softer.    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 Patient underwent a left lumpectomy and 5 negative nodes removed on 09/25/2019. It is ER/PR positive and HER2 negative with a Ki67 of 2%.    Patient Stated Goals  See if my arm is ok and get rid of my cording    Currently in Pain?  No/denies    Pain Score  0-No pain                       [facility] Adult PT Treatment/Exercise - 10/30/19 0001      Manual Therapy   Manual Therapy   Manual Lymphatic Drainage (MLD);Soft tissue mobilization;Myofascial release    Soft tissue mobilization  along the cord in L axilla, lateral edge of L biceps near distal insertion, over fibrotic area on the L superior/lateral breast; decreased minimally following light STM following by modified MLD     Myofascial Release  longitudinal and cross friction myofascial release at the L axilla and along the medial brachium at the L UE with 1x pop from cording    Manual Lymphatic Drainage (MLD)  In supine: 5 diaphragmatic breathes, short neck, BIl axillary and inguinal nodes, established anterior inter-axillary anastomosis and L axillo-inguinal anastomosis, superior L breast, anterior inter-axillary anastomosis, inferior L breast, L axillo-inguinal anastomosis, reworked all surfaces. Pt was educated throughout with varying techniques including VC, tactile cueing, hand over hand demonstration and demonstration for correct direction, skin stretch and pressure.              PT Education - 10/30/19 0809    Education Details  Pt was educated on self MLD of the L breast with step by step with tactile cueing, demonstration and hand over hand assistance for pressure and direction.    Person(s) Educated  Patient    Methods  Explanation;Demonstration;Tactile cues;Verbal cues    Comprehension  Verbalized understanding;Returned demonstration          PT Long Term Goals - 10/27/19 1347      PT LONG TERM GOAL #1   Title  Patient will demonstrate she has regained full shoulder ROM and function post operatively compared to bsaeline assessment.    Time  4    Period  Weeks    Status  On-going      PT LONG TERM GOAL #2   Title  Patient will improve shoulder abduction to be >/= 164 for increased ease reaching overhead and to return to baseline.    Baseline  157 post op; 164 pre-op    Time  4    Period  Weeks    Status  New    Target Date  11/24/19      PT LONG TERM GOAL #3   Title  Patient will improve  DASH score to 0 (zero) for improved shoulder function and to return to baseline function.    Baseline  11.36 post op; 0 pre-op    Time  4    Period  Weeks    Status  New    Target Date  11/24/19      PT LONG TERM GOAL #4   Title  Patient will be independent with performing self manual lymph drainage for left breast.    Time  4    Period  Weeks    Status  New    Target Date  11/24/19            Plan - 10/30/19 0801    Clinical Impression Statement  Pt presents to physical therapy with continued cording in her LUE. She has a compression bra that is too large and pt has already ordered a smaller size. She is wearing the chip pack that was provided at her last session. Pt performed self MLD for the L breast following each step performed by the physical therapist with VC, tactile cueing and hand over hand for direction, skin stretch and pressure. She was educated throughout on the anatomy/physiology of the lymphatic system. Palpable tightness/tenderness noted at the L breast in the lateral portion of the superior quadrant, in the L axilla at the base of cording near the trunk and at the lateral portion of the L biceps; decreased minimally following light STM. Modified MLD was performed following STM to decrease risk for edema and facilitate lymphatic flow. Pt will benefit from continued POC.    Rehab Potential  Excellent    PT Frequency  2x / week    PT Duration  4 weeks    PT Treatment/Interventions  ADLs/Self Care Home Management;Therapeutic exercise;Patient/family education;Manual techniques;Manual lymph drainage;Passive range of motion;Scar mobilization;Therapeutic activities    PT Next Visit Plan  assess indep with breast manual lymph drainage, see if chip pack helped, work on cording in LUE especially near antecubital fossa    PT Home Exercise Plan  Post op shoulder ROM HEP - encouraged her to do hands behind head and AAROM flexion both in supine; added closed chain abduction and neural  stretching to imrpove cording    Consulted and Agree with Plan of Care  Patient       Patient will benefit from skilled therapeutic intervention in order to improve the following deficits and impairments:  Decreased knowledge of precautions, Impaired UE functional use, Pain, Postural dysfunction, Decreased range of motion, Increased edema  Visit Diagnosis: Localized edema  Aftercare  following surgery for neoplasm  Abnormal posture  Malignant neoplasm of upper-outer quadrant of left breast in female, estrogen receptor positive ([facility])  Stiffness of left shoulder, not elsewhere classified     Problem List Patient Active Problem List   Diagnosis Date Noted  . Malignant neoplasm of upper-outer quadrant of left breast in female, estrogen receptor positive ([place]) 09/09/2019    Ander Purpura, PT 10/30/2019, 9:04 AM  [place], [place], [address] Phone: [phone]   Fax:  [phone]  Name: Hannah Wallace MRN: [id number] Date of Birth: [birth year]

## 2019-11-03 ENCOUNTER — Telehealth: Payer: Self-pay | Admitting: *Deleted

## 2019-11-03 ENCOUNTER — Ambulatory Visit: Payer: BC Managed Care – PPO

## 2019-11-03 ENCOUNTER — Other Ambulatory Visit: Payer: Self-pay

## 2019-11-03 ENCOUNTER — Encounter: Payer: Self-pay | Admitting: Hematology and Oncology

## 2019-11-03 DIAGNOSIS — M25612 Stiffness of left shoulder, not elsewhere classified: Secondary | ICD-10-CM

## 2019-11-03 DIAGNOSIS — R600 Localized edema: Secondary | ICD-10-CM

## 2019-11-03 DIAGNOSIS — R293 Abnormal posture: Secondary | ICD-10-CM

## 2019-11-03 DIAGNOSIS — C50412 Malignant neoplasm of upper-outer quadrant of left female breast: Secondary | ICD-10-CM

## 2019-11-03 DIAGNOSIS — Z483 Aftercare following surgery for neoplasm: Secondary | ICD-10-CM

## 2019-11-03 NOTE — Therapy (Signed)
[place], [place], [address] Phone: [phone]   Fax:  [phone]  Physical Therapy Treatment  Patient Details  Name: Hannah Wallace MRN: [id number] Date of Birth: 1955-12-26 Referring Provider (PT): Dr. Stark Klein   Encounter Date: 11/03/2019  PT End of Session - 11/03/19 0805    Visit Number  5    Number of Visits  10    Date for PT Re-Evaluation  11/24/19    PT Start Time  0804    PT Stop Time  0900    PT Time Calculation (min)  56 min    Activity Tolerance  Patient tolerated treatment well    Behavior During Therapy  [facility] for tasks assessed/performed       Past Medical History:  Diagnosis Date  . Basal cell carcinoma    on back  . Cancer [facility])    breast cancer    Past Surgical History:  Procedure Laterality Date  . BREAST LUMPECTOMY WITH RADIOACTIVE SEED AND SENTINEL LYMPH NODE BIOPSY Left 09/25/2019   Procedure: LEFT BREAST LUMPECTOMY WITH RADIOACTIVE SEED AND SENTINEL LYMPH NODE BIOPSY;  Surgeon: Stark Klein, MD;  Location: [place];  Service: General;  Laterality: Left;  . COLONOSCOPY      There were no vitals filed for this visit.  Subjective Assessment - 11/03/19 0805    Subjective  Pt reports that she has nothing to note. She is a little sore if she touches her L breast but overall the area feels better and is not painful.    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 Patient underwent a left lumpectomy and 5 negative nodes removed on 09/25/2019. It is ER/PR positive and HER2 negative with a Ki67 of 2%.    Patient Stated Goals  See if my arm is ok and get rid of my cording    Currently in Pain?  No/denies    Pain Score  0-No pain                       [facility] Adult PT Treatment/Exercise - 11/03/19 0001      Exercises   Exercises  Other Exercises    Other Exercises   Pt performed bicep  stretch on the wall for LUE, wrist flexor stretch and wrist extensor stretch 20 seconds each stretch with demonstration and return demonstration to adequately address the correct muscle groups.       Manual Therapy   Manual Therapy  Manual Lymphatic Drainage (MLD);Soft tissue mobilization;Myofascial release    Soft tissue mobilization  Palpable tightness/tenderness noted in the L biceps, wrist flexors/extensors and supinator; decreased minimally following TpR and STM     Myofascial Release  Longitudinal myofascial release in L brachium and antebrachium for cording which appears to originate in the L axilla and at the L antecubital fossa.     Manual Lymphatic Drainage (MLD)  In supine: 5 diaphragmatic breathes, short neck, BIl axillary and inguinal nodes, established anterior inter-axillary anastomosis and L axillo-inguinal anastomosis, superior L breast, anterior inter-axillary anastomosis, inferior L breast, L axillo-inguinal anastomosis, medial to lateral L brachium, lateral L brachium, all surfaces of L antebrachium, dorsum of L hand, reworked all surfaces followed by deep abdominals.              PT Education - 11/03/19 0808    Education Details  Pt will cotninue with self MLD for the L  breast at home and perform new stretch HEP Access Code: [email or web address] . She was provided with information for looking at the [facility] training website for MLD videos to watch at home to help her slow down due to pt reports she goes really fast.    Person(s) Educated  Patient    Methods  Explanation;Handout;Demonstration    Comprehension  Verbalized understanding;Returned demonstration          PT Long Term Goals - 10/27/19 1347      PT LONG TERM GOAL #1   Title  Patient will demonstrate she has regained full shoulder ROM and function post operatively compared to bsaeline assessment.    Time  4    Period  Weeks    Status  On-going      PT LONG TERM GOAL #2   Title  Patient will improve shoulder  abduction to be >/= 164 for increased ease reaching overhead and to return to baseline.    Baseline  157 post op; 164 pre-op    Time  4    Period  Weeks    Status  New    Target Date  11/24/19      PT LONG TERM GOAL #3   Title  Patient will improve DASH score to 0 (zero) for improved shoulder function and to return to baseline function.    Baseline  11.36 post op; 0 pre-op    Time  4    Period  Weeks    Status  New    Target Date  11/24/19      PT LONG TERM GOAL #4   Title  Patient will be independent with performing self manual lymph drainage for left breast.    Time  4    Period  Weeks    Status  New    Target Date  11/24/19            Plan - 11/03/19 0805    Clinical Impression Statement  Pt presents to physical therapy this session with continued cording in her LUE. Her L breast is significantly softer than her last session and she reports that she has continuously been using chip pack. Palpable tightness and tenderness noted in her L biceps, wrist flexors/extensors and supinators; decreased minimally with Tp throughout following STM and TpR. Longitudinal myofascial release was performed from axilla to antecubital fossa and from antecubital fossa in slight spiral pattern to anterior antebrachium due to direction of cording. Pt seems to have cording that originates at the antecubital fossa; no significant improvement following myofascial release this session. MLD was perfomred following myofascial release and STM in order to decrease risk for edema and to faciltiate lymphatic flow to the anterior trunk, L breast and LUE. Pt was provided with some light stretching for the L biceps and wrist flexor/extensors in order to help slowly extend cording. Pt will benefit from continued POC at this time.    PT Frequency  2x / week    PT Duration  4 weeks    PT Treatment/Interventions  ADLs/Self Care Home Management;Therapeutic exercise;Patient/family education;Manual techniques;Manual lymph  drainage;Passive range of motion;Scar mobilization;Therapeutic activities    PT Next Visit Plan  assess indep with breast manual lymph drainage, see if chip pack helped, work on cording in LUE especially near antecubital fossa    PT Home Exercise Plan  Post op shoulder ROM HEP - encouraged her to do hands behind head and AAROM flexion both in supine; added closed chain abduction and neural stretching  to imrpove cording    Consulted and Agree with Plan of Care  Patient       Patient will benefit from skilled therapeutic intervention in order to improve the following deficits and impairments:  Decreased knowledge of precautions, Impaired UE functional use, Pain, Postural dysfunction, Decreased range of motion, Increased edema  Visit Diagnosis: Localized edema  Aftercare following surgery for neoplasm  Abnormal posture  Malignant neoplasm of upper-outer quadrant of left breast in female, estrogen receptor positive (HCC)  Stiffness of left shoulder, not elsewhere classified     Problem List Patient Active Problem List   Diagnosis Date Noted  . Malignant neoplasm of upper-outer quadrant of left breast in female, estrogen receptor positive ([place]) 09/09/2019    Ander Purpura, PT 11/03/2019, 9:13 AM  [place], [place], [address] Phone: [phone]   Fax:  [phone]  Name: Waunetta Riggle MRN: [id number] Date of Birth: [birth year]

## 2019-11-03 NOTE — Telephone Encounter (Signed)
Received oncotype score of 26. Physician team notified. Called pt with results. Scheduled and confirmed appt for 12/29 with Dr. Lindi Adie to discuss results.

## 2019-11-03 NOTE — Progress Notes (Signed)
Patient Care Team: Joella Prince as PCP - General (Physician Assistant) Mauro Kaufmann, RN as Oncology Nurse Navigator Rockwell Germany, RN as Oncology Nurse Navigator  DIAGNOSIS:    ICD-10-CM   1. Malignant neoplasm of upper-outer quadrant of left breast in female, estrogen receptor positive ([place])  C50.412    Z17.0     SUMMARY OF ONCOLOGIC HISTORY: Oncology History  Malignant neoplasm of upper-outer quadrant of left breast in female, estrogen receptor positive ([place])  09/03/2019 Initial Diagnosis   Routine screening mammogram detected a 1.4cm indeterminate left breast mass. US showed a 1.0cm left breast mass and a 0.5cm lymph node with cortical thickening at the 2:00 position in the left breast. 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   09/10/2019 Cancer Staging   Staging form: Breast, AJCC 8th Edition - Clinical stage from 09/10/2019: Stage IA (cT1c, cN0, cM0, G2, ER+, PR+, HER2-) - Signed by Nicholas Lose, MD on 09/10/2019   09/25/2019 Surgery   Left lumpectomy [facility]): IDC, grade 2, 1.2cm, with intermediate grade DCIS, 5 axillary lymph nodes negative, clear margins.    09/25/2019 Cancer Staging   Staging form: Breast, AJCC 8th Edition - Pathologic stage from 09/25/2019: Stage IA (pT1c, pN0, cM0, G2, ER+, PR+, HER2-) - Signed by Gardenia Phlegm, NP on 10/15/2019   11/03/2019 Oncotype testing   Oncotype score 26     CHIEF COMPLIANT: Follow-up to discuss Oncotype results and further treatment  INTERVAL HISTORY: Hannah Wallace is a 63 y.o. with above-mentioned history of left breast cancer who underwent a lumpectomy. Oncotype testing showed a score of 26. She presents to the clinic today to discuss her Oncotype results and further treatment.   REVIEW OF SYSTEMS:   Constitutional: Denies fevers, chills or abnormal weight loss Eyes: Denies blurriness of vision Ears, nose, mouth, throat, and  face: Denies mucositis or sore throat Respiratory: Denies cough, dyspnea or wheezes Cardiovascular: Denies palpitation, chest discomfort Gastrointestinal: Denies nausea, heartburn or change in bowel habits Skin: Denies abnormal skin rashes Lymphatics: Denies new lymphadenopathy or easy bruising Neurological: Denies numbness, tingling or new weaknesses Behavioral/Psych: Mood is stable, no new changes  Extremities: No lower extremity edema Breast: denies any pain or lumps or nodules in either breasts All other systems were reviewed with the patient and are negative.  I have reviewed the past medical history, past surgical history, social history and family history with the patient and they are unchanged from previous note.  ALLERGIES:  has No Known Allergies.  MEDICATIONS:  Current Outpatient Medications  Medication Sig Dispense Refill  . b complex vitamins capsule Take 1 capsule by mouth daily.    . Multiple Vitamin (MULTIVITAMIN) tablet Take 1 tablet by mouth daily.    . Omega-3 Fatty Acids (FISH OIL) 1000 MG CAPS Take 1,000 mg by mouth 2 (two) times daily.    [name] Glycol-Propyl Glycol (SYSTANE) 0.4-0.3 % SOLN Place 1 drop into both eyes daily.    Marland Kitchen UNABLE TO FIND [employer] Thyroid Complete    . vitamin C (ASCORBIC ACID) 500 MG tablet Take 500 mg by mouth daily.    . Vitamin D-Vitamin K (VITAMIN K2-VITAMIN D3 PO) Take 1 tablet by mouth daily. D3 125 mcg K2 90 mcg     No current facility-administered medications for this visit.    PHYSICAL EXAMINATION: ECOG PERFORMANCE STATUS: 1 - Symptomatic but completely ambulatory  Vitals:  11/04/19 1350  BP: 131/78  Pulse: (!) 111  Resp: 18  Temp: 98.7 F (37.1 C)  SpO2: 99%   Filed Weights   11/04/19 1350  Weight: 198 lb 8 oz (90 kg)    GENERAL: alert, no distress and comfortable SKIN: skin color, texture, turgor are normal, no rashes or significant lesions EYES: normal, Conjunctiva are pink and non-injected, sclera  clear OROPHARYNX: no exudate, no erythema and lips, buccal mucosa, and tongue normal  NECK: supple, thyroid normal size, non-tender, without nodularity LYMPH: no palpable lymphadenopathy in the cervical, axillary or inguinal LUNGS: clear to auscultation and percussion with normal breathing effort HEART: regular rate & rhythm and no murmurs and no lower extremity edema ABDOMEN: abdomen soft, non-tender and normal bowel sounds MUSCULOSKELETAL: no cyanosis of digits and no clubbing  NEURO: alert & oriented x 3 with fluent speech, no focal motor/sensory deficits EXTREMITIES: No lower extremity edema  LABORATORY DATA:  I have reviewed the data as listed CMP Latest Ref Rng & Units 09/10/2019  Glucose 70 - 99 mg/dL 85  BUN 8 - 23 mg/dL 18  Creatinine 0.44 - 1.00 mg/dL 0.87  Sodium 135 - 145 mmol/L 140  Potassium 3.5 - 5.1 mmol/L 4.1  Chloride 98 - 111 mmol/L 104  CO2 22 - 32 mmol/L 25  Calcium 8.9 - 10.3 mg/dL 9.5  Total Protein 6.5 - 8.1 g/dL 7.6  Total Bilirubin 0.3 - 1.2 mg/dL 0.5  Alkaline Phos 38 - 126 U/L 69  AST 15 - 41 U/L 19  ALT 0 - 44 U/L 47(H)    Lab Results  Component Value Date   WBC 9.2 09/22/2019   HGB 15.3 (H) 09/22/2019   HCT 45.9 09/22/2019   MCV 89.8 09/22/2019   PLT 242 09/22/2019   NEUTROABS 4.1 09/10/2019    ASSESSMENT & PLAN:  Malignant neoplasm of upper-outer quadrant of left breast in female, estrogen receptor positive ([place]) 09/03/2019:Routine screening mammogram detected a 1.4cm indeterminate left breast mass. US showed a 1.0cm left breast mass and a 0.5cm lymph node with cortical thickening at the 2:00 position in the left breast. 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 T1c N0stage Ia clinical stage  09/25/2019:Left lumpectomy [facility]): IDC, grade 2, 1.2cm, with intermediate grade DCIS, 5 axillary lymph nodes negative, clear margins. HER-2 - by FISH, ER+ 90%, PR+ 100%, Ki67 2%,    Oncotype DX score 26  Recommendation:  1. Adjuvant chemotherapy with Taxotere and Cytoxan every 3 weeks x 4 2.  Adjuvant radiation therapy followed by 3.  Adjuvant antiestrogen therapy  Chemotherapy Counseling: I discussed the risks and benefits of chemotherapy including the risks of nausea/ vomiting, risk of infection from low WBC count, fatigue due to chemo or anemia, bruising or bleeding due to low platelets, mouth sores, loss/ change in taste and decreased appetite. Liver and kidney function will be monitored through out chemotherapy as abnormalities in liver and kidney function may be a side effect of treatment.  Risk of neuropathy from Taxotere was discussed. Risk of permanent bone marrow dysfunction and leukemia due to chemo were also discussed.  Patient wanted to know what clinical trials are available for her.  I discussed with her about upbeat and SWOG 1714 She has not finalize a decision and will inform us tomorrow about chemotherapy. Return to clinic in 2 weeks to start chemotherapy      No orders of the defined types  were placed in this encounter.  The patient has a good understanding of the overall plan. she agrees with it. she will call with any problems that may develop before the next visit here.  Nicholas Lose, MD 11/04/2019  Julious Oka Dorshimer, am acting as scribe for Dr. Nicholas Lose.  I have reviewed the above document for accuracy and completeness, and I agree with the above.

## 2019-11-03 NOTE — Patient Instructions (Signed)
Access Code: [email or web address]  URL: [email or web address]  Date: 11/03/2019  Prepared by: Tomma Rakers   Exercises Standing Wrist Extension Stretch - 3 reps - 3 sets - 20 seconds hold - 2x daily - 7x weekly Standing Wrist Flexion Stretch - 3 reps - 3 sets - 20 seconds hold - 2x daily - 7x weekly Standing Bicep Stretch at Wall - 3 reps - 3 sets - 20 seconds hold - 2x daily - 7x weekly

## 2019-11-04 ENCOUNTER — Encounter: Payer: Self-pay | Admitting: Hematology and Oncology

## 2019-11-04 ENCOUNTER — Inpatient Hospital Stay: Payer: BC Managed Care – PPO | Attending: Hematology and Oncology | Admitting: Hematology and Oncology

## 2019-11-04 ENCOUNTER — Other Ambulatory Visit: Payer: Self-pay

## 2019-11-04 ENCOUNTER — Encounter: Payer: Self-pay | Admitting: Medical Oncology

## 2019-11-04 DIAGNOSIS — C50412 Malignant neoplasm of upper-outer quadrant of left female breast: Secondary | ICD-10-CM

## 2019-11-04 DIAGNOSIS — Z170 Estrogen receptor positive status [ER+]: Secondary | ICD-10-CM

## 2019-11-04 NOTE — Progress Notes (Signed)
[id number]: Referral Dr. Lindi Adie referred patient to study.  I met with patient, who is here with her sister, today after her appointment with Dr. Lindi Adie. Patient confirms that Dr. Lindi Adie gave her a brief explanation of the study and patient expressed interest in knowing more. I gave patient a description of the study as well as the required assessments and what would be expected during the study visits. Patient was informed that there is a full eligibility check, as well as baseline assessments, should she be interested in participating with study, that would need to be completed prior to her start of chemotherapy treatment. Patient expressed understanding. Patient was provided with the study consent and authorization forms for her review and my contact information. All of patient's questions were answered to her satisfaction and patient was encouraged to call me with questions. Patient was thanked for her time and interest in study.  Maxwell Marion, RN, BSN, [facility] Clinical Research 11/04/2019 3:31 PM

## 2019-11-04 NOTE — Assessment & Plan Note (Signed)
09/03/2019:Routine screening mammogram detected a 1.4cm indeterminate left breast mass. US showed a 1.0cm left breast mass and a 0.5cm lymph node with cortical thickening at the 2:00 position in the left breast. 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 T1c N0stage Ia clinical stage  09/25/2019:Left lumpectomy [facility]): IDC, grade 2, 1.2cm, with intermediate grade DCIS, 5 axillary lymph nodes negative, clear margins. HER-2 - by FISH, ER+ 90%, PR+ 100%, Ki67 2%,  Oncotype DX score 26  Recommendation:  1. Adjuvant chemotherapy with Taxotere and Cytoxan every 3 weeks x 4 2.  Adjuvant radiation therapy followed by 3.  Adjuvant antiestrogen therapy  Chemotherapy Counseling: I discussed the risks and benefits of chemotherapy including the risks of nausea/ vomiting, risk of infection from low WBC count, fatigue due to chemo or anemia, bruising or bleeding due to low platelets, mouth sores, loss/ change in taste and decreased appetite. Liver and kidney function will be monitored through out chemotherapy as abnormalities in liver and kidney function may be a side effect of treatment.  Risk of neuropathy from Taxotere was discussed. Risk of permanent bone marrow dysfunction and leukemia due to chemo were also discussed.  Return to clinic in 2 weeks to start chemotherapy

## 2019-11-04 NOTE — Progress Notes (Signed)
UPBEAT: Referral Dr. Gudena referred patient to study.  I met with patient this afternoon, who is here with her sister, after her MD appointment. Patient confirms that MD discussed the study with her and gave her a brief explanation of the study. I also gave patient an explanation of the study and informed her of the study assessments and study visit requirements. Patient confirms that she is not claustrophobic, denies having any metal implants, confirms that she is able to walk at least 2 blocks without chest pain, shortness of breat or feeling faint, confirms being able to hold her breath for at least 10 seconds and is able to exercise on a treadmill. Patient was informed of the eligibility check and that the baseline assessments would need to be completed prior to her first chemotherapy treatment. Time was taken to ensure that all patient's questions were answered to her satisfaction. Today, patient was provided the study consent and authorization forms for review. I inquired with patient her treatment start date, and she stated she was considering that and was planning to inform Dr. Gudena tomorrow. Patient was provided with a research trial information pamphlet and my contact information and she was encouraged to call me with questions.  Patient and sister were thanked for their time and patient's interest in study.  Mirjana C. Leonetti, RN, BSN, [facility] Clinical Research 11/04/2019 3:22 PM  

## 2019-11-05 ENCOUNTER — Telehealth: Payer: Self-pay | Admitting: *Deleted

## 2019-11-05 ENCOUNTER — Telehealth: Payer: Self-pay | Admitting: Hematology and Oncology

## 2019-11-05 NOTE — Telephone Encounter (Signed)
Spoke to pt regarding tx plan. Pt has decided to move forward with chemo. Physician team notified.  Informed pt of next steps.

## 2019-11-05 NOTE — Telephone Encounter (Signed)
Called and spoke wit patient. Confirmed patient edu class on 1/8

## 2019-11-06 ENCOUNTER — Encounter

## 2019-11-06 ENCOUNTER — Telehealth: Payer: Self-pay | Admitting: Medical Oncology

## 2019-11-06 ENCOUNTER — Ambulatory Visit: Payer: BC Managed Care – PPO | Admitting: Rehabilitation

## 2019-11-06 ENCOUNTER — Other Ambulatory Visit: Payer: Self-pay | Admitting: Hematology and Oncology

## 2019-11-06 ENCOUNTER — Other Ambulatory Visit: Payer: Self-pay

## 2019-11-06 ENCOUNTER — Encounter: Payer: Self-pay | Admitting: Rehabilitation

## 2019-11-06 DIAGNOSIS — C50412 Malignant neoplasm of upper-outer quadrant of left female breast: Secondary | ICD-10-CM

## 2019-11-06 DIAGNOSIS — Z483 Aftercare following surgery for neoplasm: Secondary | ICD-10-CM

## 2019-11-06 DIAGNOSIS — R600 Localized edema: Secondary | ICD-10-CM

## 2019-11-06 DIAGNOSIS — R293 Abnormal posture: Secondary | ICD-10-CM | POA: Diagnosis not present

## 2019-11-06 DIAGNOSIS — M25612 Stiffness of left shoulder, not elsewhere classified: Secondary | ICD-10-CM

## 2019-11-06 DIAGNOSIS — Z170 Estrogen receptor positive status [ER+]: Secondary | ICD-10-CM

## 2019-11-06 MED ORDER — DEXAMETHASONE 4 MG PO TABS: 4.0000 mg | ORAL_TABLET | Freq: Every day | ORAL | 0 refills | Status: DC

## 2019-11-06 MED ORDER — PROCHLORPERAZINE MALEATE 10 MG PO TABS: 10.0000 mg | ORAL_TABLET | Freq: Four times a day (QID) | ORAL | 1 refills | Status: DC | PRN

## 2019-11-06 MED ORDER — LIDOCAINE-PRILOCAINE 2.5-2.5 % EX CREA: TOPICAL_CREAM | CUTANEOUS | 3 refills | Status: DC

## 2019-11-06 MED ORDER — LORAZEPAM 0.5 MG PO TABS: 0.5000 mg | ORAL_TABLET | Freq: Every evening | ORAL | 0 refills | Status: DC | PRN

## 2019-11-06 MED ORDER — ONDANSETRON HCL 8 MG PO TABS: 8.0000 mg | ORAL_TABLET | Freq: Two times a day (BID) | ORAL | 1 refills | Status: DC | PRN

## 2019-11-06 NOTE — Telephone Encounter (Signed)
[id number] and UPBEAT Referral Follow-up call to patient this afternoon to see if she had any questions regarding the two studies that Dr. Lindi Adie referred her to. Patient was provided with consent forms for both studies at our initial visit on the 29th. Patient inquired as to the cardiac MRI for UPBEAT and whether she would be receiving the results from this. I informed patient that we receive results, but this is not a detailed report. Study informs the MD of anything abnormal and then Dr. Lindi Adie would refer her to a cardiologist, should he feel it needs to be followed-up. Patient gave her verbal understanding to this. Patient inquired as to when she would need to complete the assessments for both studies. I informed her that signing of consent and baseline assessments for both studies would need to be completed prior to the start of her chemotherapy. I also informed patient that it appears that her treatment may be starting at the end of next week, but that this date may be pending other appointments, such as port placement. Patient was informed that appointments are not set yet, except for her chemo-education class on the 8th of January. I informed her that we may not have time for UPBEAT due to needing to set up a cardiac MRI appt as well, it may be a scheduling conflict, and this may be too much for her to complete both study baseline assessments, patient agreed. Patient lives in [place] and expressed desire to keep additional visits to a minimum.  Patient confirmed interest in continuing with [phone]. I informed patient that I will reach out to the Breast Nurse Navigators and see when and what other appointments she has pending as well as when her treatment start date would be, then I will follow up with her either Monday afternoon or Tuesday of next week, for S1714 consent and assessments. Patient gave verbal understanding and denied further questions. I thanked patient for her time and interest in both studies  and encouraged her to call Dr. Lindi Adie or myself, in the meantime, with questions/concerns.  Maxwell Marion, RN, BSN, [facility] Clinical Research 11/06/2019 3:57 PM

## 2019-11-06 NOTE — Progress Notes (Signed)
START ON PATHWAY REGIMEN - Breast     A cycle is every 21 days:     Docetaxel      Cyclophosphamide   **Always confirm dose/schedule in your pharmacy ordering system**  Patient Characteristics: Postoperative without Neoadjuvant Therapy (Pathologic Staging), Invasive Disease, Adjuvant Therapy, HER2 Negative/Unknown/Equivocal, ER Positive, Node Negative, pT1a-c, pN0/N13m or pT2 or Higher, pN0, Oncotype High Risk (? 26) Therapeutic Status: Postoperative without Neoadjuvant Therapy (Pathologic Staging) AJCC Grade: G2 AJCC N Category: pN0 AJCC M Category: cM0 ER Status: Positive (+) AJCC 8 Stage Grouping: IA HER2 Status: Negative (-) Oncotype Dx Recurrence Score: 26 AJCC T Category: pT1c PR Status: Positive (+) Has this patient completed genomic testing<= Yes - Oncotype DX(R) Intent of Therapy: Curative Intent, Discussed with Patient

## 2019-11-06 NOTE — Therapy (Signed)
[facility] [place], [place], [address] Phone: [phone]   Fax:  [phone]  Physical Therapy Treatment  Patient Details  Name: Hannah Wallace MRN: [id number] Date of Birth: Apr 02, 1956 Referring Provider (PT): Dr. Stark Klein   Encounter Date: 11/06/2019  PT End of Session - 11/06/19 0900    Visit Number  6    Number of Visits  10    Date for PT Re-Evaluation  11/24/19    PT Start Time  0802    PT Stop Time  8811    PT Time Calculation (min)  53 min    Activity Tolerance  Patient tolerated treatment well    Behavior During Therapy  [facility] for tasks assessed/performed       Past Medical History:  Diagnosis Date  . Basal cell carcinoma    on back  . Cancer [facility])    breast cancer    Past Surgical History:  Procedure Laterality Date  . BREAST LUMPECTOMY WITH RADIOACTIVE SEED AND SENTINEL LYMPH NODE BIOPSY Left 09/25/2019   Procedure: LEFT BREAST LUMPECTOMY WITH RADIOACTIVE SEED AND SENTINEL LYMPH NODE BIOPSY;  Surgeon: Stark Klein, MD;  Location: [place];  Service: General;  Laterality: Left;  . COLONOSCOPY      There were no vitals filed for this visit.  Subjective Assessment - 11/06/19 0805    Subjective  Overall getting better.  The area is getting softer in the breast.  The arm feels about the same.    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 Patient underwent a left lumpectomy and 5 negative nodes removed on 09/25/2019. It is ER/PR positive and HER2 negative with a Ki67 of 2%.    Currently in Pain?  No/denies         [facility] PT Assessment - 11/06/19 0001      Observation/Other Assessments   Observations  no cording evident today throughout the left UE      AROM   Left Shoulder Flexion  168 Degrees    Left Shoulder ABduction  178 Degrees    Left Shoulder Internal Rotation  88 Degrees    Left Shoulder External  Rotation  104 Degrees    Left Shoulder Horizontal ABduction  35 Degrees                   [facility] Adult PT Treatment/Exercise - 11/06/19 0001      Manual Therapy   Manual Therapy  Passive ROM    Soft tissue mobilization  throughout left UE from wrist to axilla without cording evident muscular tightness at the elbow flexor mass and mildly into the biceps and into the left pectoralis muscle and lateral breast scar tissue area    Manual Lymphatic Drainage (MLD)  In supine: short neck, bil shoulder collectors, sternal nodes, superficial and deep abdominals, right axillary and left inguinal nodes, established anterior inter-axillary anastomosis and L axillo-inguinal anastomosis, superior L breast, anterior inter-axillary anastomosis, then focus on lateral inferior L breast, L axillo-inguinal anastomosis and towards the Rt axillary nodes    Passive ROM  of the left shoulder to tolerance into flexion, abduction, D2 extension, and 90/90                   PT Long Term Goals - 11/06/19 0315      PT LONG TERM GOAL #1   Title  Patient will demonstrate she has regained full shoulder ROM  and function post operatively compared to bsaeline assessment.    Status  Achieved      PT LONG TERM GOAL #2   Title  Patient will improve shoulder abduction to be >/= 164 for increased ease reaching overhead and to return to baseline.    Baseline  157 post op; 164 pre-op, 168 on 11/06/19    Status  Achieved      PT LONG TERM GOAL #3   Title  Patient will improve DASH score to 0 (zero) for improved shoulder function and to return to baseline function.    Status  On-going      PT LONG TERM GOAL #4   Title  Patient will be independent with performing self manual lymph drainage for left breast.    Status  Achieved            Plan - 11/06/19 0901    Clinical Impression Statement  cording not palpable in the left UE today but still feeling some soreness from last session which seemed to  help alot.  shoulder AROM now at full with no to minimal pulling only with end range flexion into the lateral breast.  Breast swelling also controlled well with lateral breast fibrosis vs scar tissue but without tenderness any more.  Pt reports fear of using the arm and we discussed transitioning to more exercise at this time with pt very agreeable.    PT Frequency  2x / week    PT Duration  4 weeks    PT Treatment/Interventions  ADLs/Self Care Home Management;Therapeutic exercise;Patient/family education;Manual techniques;Manual lymph drainage;Passive range of motion;Scar mobilization;Therapeutic activities    PT Next Visit Plan  keep an eye on any cording return in the left UE, continue some MLD/myofascial work to the left lateral breast and axillary edema, try to transition to exercise possibly teaching strength ABC for home or resistance bands       Patient will benefit from skilled therapeutic intervention in order to improve the following deficits and impairments:     Visit Diagnosis: Localized edema  Aftercare following surgery for neoplasm  Abnormal posture  Malignant neoplasm of upper-outer quadrant of left breast in female, estrogen receptor positive (HCC)  Stiffness of left shoulder, not elsewhere classified     Problem List Patient Active Problem List   Diagnosis Date Noted  . Malignant neoplasm of upper-outer quadrant of left breast in female, estrogen receptor positive ([place]) 09/09/2019    [name] 11/06/2019, 9:06 AM  [place], [place], [address] Phone: [phone]   Fax:  [phone]  Name: Hannah Wallace MRN: [id number] Date of Birth: 01/15/1956

## 2019-11-10 ENCOUNTER — Encounter: Payer: Self-pay | Admitting: Physical Therapy

## 2019-11-10 ENCOUNTER — Other Ambulatory Visit: Payer: Self-pay

## 2019-11-10 ENCOUNTER — Ambulatory Visit: Payer: BC Managed Care – PPO | Attending: General Surgery | Admitting: Physical Therapy

## 2019-11-10 ENCOUNTER — Encounter: Payer: Self-pay | Admitting: *Deleted

## 2019-11-10 ENCOUNTER — Other Ambulatory Visit: Payer: Self-pay | Admitting: General Surgery

## 2019-11-10 DIAGNOSIS — Z483 Aftercare following surgery for neoplasm: Secondary | ICD-10-CM | POA: Diagnosis present

## 2019-11-10 DIAGNOSIS — R293 Abnormal posture: Secondary | ICD-10-CM | POA: Diagnosis present

## 2019-11-10 DIAGNOSIS — M25612 Stiffness of left shoulder, not elsewhere classified: Secondary | ICD-10-CM | POA: Diagnosis present

## 2019-11-10 DIAGNOSIS — R600 Localized edema: Secondary | ICD-10-CM | POA: Diagnosis present

## 2019-11-10 DIAGNOSIS — Z170 Estrogen receptor positive status [ER+]: Secondary | ICD-10-CM | POA: Diagnosis present

## 2019-11-10 DIAGNOSIS — C50412 Malignant neoplasm of upper-outer quadrant of left female breast: Secondary | ICD-10-CM | POA: Insufficient documentation

## 2019-11-10 NOTE — Patient Instructions (Signed)

## 2019-11-10 NOTE — Therapy (Signed)
[place], [place], [address] Phone: [phone]   Fax:  [phone]  Physical Therapy Treatment  Patient Details  Name: Hannah Wallace MRN: [id number] Date of Birth: [birth year] Referring Provider (PT): Dr. Stark Klein   Encounter Date: 11/10/2019  PT End of Session - 11/10/19 0851    Visit Number  7    Number of Visits  10    Date for PT Re-Evaluation  11/24/19    PT Start Time  0806    PT Stop Time  0851    PT Time Calculation (min)  45 min    Activity Tolerance  Patient tolerated treatment well    Behavior During Therapy  [facility] for tasks assessed/performed       Past Medical History:  Diagnosis Date  . Basal cell carcinoma    on back  . Cancer [facility])    breast cancer    Past Surgical History:  Procedure Laterality Date  . BREAST LUMPECTOMY WITH RADIOACTIVE SEED AND SENTINEL LYMPH NODE BIOPSY Left 09/25/2019   Procedure: LEFT BREAST LUMPECTOMY WITH RADIOACTIVE SEED AND SENTINEL LYMPH NODE BIOPSY;  Surgeon: Stark Klein, MD;  Location: [facility];  Service: General;  Laterality: Left;  . COLONOSCOPY      There were no vitals filed for this visit.  Subjective Assessment - 11/10/19 0807    Subjective  The arm is still feeling really good.    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 Patient underwent a left lumpectomy and 5 negative nodes removed on 09/25/2019. It is ER/PR positive and HER2 negative with a Ki67 of 2%.    Patient Stated Goals  See if my arm is ok and get rid of my cording    Currently in Pain?  No/denies    Pain Score  0-No pain                       [facility] Adult PT Treatment/Exercise - 11/10/19 0001      Exercises   Exercises  Shoulder      Shoulder Exercises: Supine   Horizontal ABduction  Strengthening;Both;10 reps;Theraband   pt returned therapist demo   Theraband Level (Shoulder  Horizontal ABduction)  Level 1 (Yellow)    External Rotation  Strengthening;Both;10 reps;Theraband   pt returned therapist demo   Theraband Level (Shoulder External Rotation)  Level 1 (Yellow)    Flexion  Strengthening;Both;10 reps;Theraband   pt returned therapist demo, narrow and wide grip   Theraband Level (Shoulder Flexion)  Level 1 (Yellow)    Diagonals  Strengthening;Both;10 reps;Theraband   pt returned therapist demo   Theraband Level (Shoulder Diagonals)  Level 1 (Yellow)      Manual Therapy   Soft tissue mobilization  to superior/lateral breast in area of scar tissue/seroma     Manual Lymphatic Drainage (MLD)  In supine: short neck, superficial and deep abdominals, left inguinal nodes and establishment of axillo inguinal pathway, right axillary nodes and establishment of interaxillary pathway, left breast moving fluid towards pathways with focus on superior lateral breast in area of seroma/ scar tissue                  PT Long Term Goals - 11/06/19 0904      PT LONG TERM GOAL #1   Title  Patient will demonstrate she has regained full shoulder ROM and function post operatively compared to bsaeline assessment.  Status  Achieved      PT LONG TERM GOAL #2   Title  Patient will improve shoulder abduction to be >/= 164 for increased ease reaching overhead and to return to baseline.    Baseline  157 post op; 164 pre-op, 168 on 11/06/19    Status  Achieved      PT LONG TERM GOAL #3   Title  Patient will improve DASH score to 0 (zero) for improved shoulder function and to return to baseline function.    Status  On-going      PT LONG TERM GOAL #4   Title  Patient will be independent with performing self manual lymph drainage for left breast.    Status  Achieved            Plan - 11/10/19 8241    Clinical Impression Statement  Pt is still presenting with full left shoulder ROM and is no longer having any cording in LUE. Instructed pt today in supine scapular  strengthening exercises with yellow theraband and issued these as part of pt's home exercise program. Will instruct pt in Strength ABC program at next session. Continued with MLD to L breast and soft tissue work to area of seroma/scar tissue in superior/lateral breast.    PT Frequency  2x / week    PT Duration  4 weeks    PT Treatment/Interventions  ADLs/Self Care Home Management;Therapeutic exercise;Patient/family education;Manual techniques;Manual lymph drainage;Passive range of motion;Scar mobilization;Therapeutic activities    PT Next Visit Plan  instruct in Strength ABC and then assess indep with this at the next visit, see if pt had any discomfort from supine scap, keep an eye on any cording return in the left UE, continue some MLD/myofascial work to the left lateral breast and axillary edema    PT Home Exercise Plan  Post op shoulder ROM HEP - encouraged her to do hands behind head and AAROM flexion both in supine; added closed chain abduction and neural stretching to imrpove cording, supine scap exercises    Consulted and Agree with Plan of Care  Patient       Patient will benefit from skilled therapeutic intervention in order to improve the following deficits and impairments:  Decreased knowledge of precautions, Impaired UE functional use, Pain, Postural dysfunction, Decreased range of motion, Increased edema  Visit Diagnosis: Localized edema  Aftercare following surgery for neoplasm  Abnormal posture  Stiffness of left shoulder, not elsewhere classified     Problem List Patient Active Problem List   Diagnosis Date Noted  . Malignant neoplasm of upper-outer quadrant of left breast in female, estrogen receptor positive ([place]) 09/09/2019    Allyson Sabal [facility] 11/10/2019, 8:57 AM  [place], [place], [address] Phone: [phone]   Fax:  [phone]  Name: Dajah Fischman MRN: [id number] Date of  Birth: 1955/12/11  Manus Gunning, PT 11/10/19 8:57 AM

## 2019-11-11 ENCOUNTER — Telehealth: Payer: Self-pay | Admitting: Medical Oncology

## 2019-11-11 ENCOUNTER — Encounter: Payer: Self-pay | Admitting: *Deleted

## 2019-11-11 ENCOUNTER — Telehealth: Payer: Self-pay | Admitting: Hematology and Oncology

## 2019-11-11 NOTE — Telephone Encounter (Signed)
Scheduled appt per 1/5 sch message - pt to get an updated schedule and print out on Chemo edu day

## 2019-11-11 NOTE — Telephone Encounter (Signed)
[id number]: Referral Call to patient regarding availability for study consent review and signing. Patient and I made plans to meet Friday, January 8th after her Chemo education class. Patient thanked and encouraged to call with questions in the meantime.  Maxwell Marion, RN, BSN, [facility] Clinical Research 11/11/2019 2:57 PM

## 2019-11-12 ENCOUNTER — Other Ambulatory Visit: Payer: Self-pay

## 2019-11-12 ENCOUNTER — Encounter (HOSPITAL_BASED_OUTPATIENT_CLINIC_OR_DEPARTMENT_OTHER): Payer: Self-pay | Admitting: General Surgery

## 2019-11-12 ENCOUNTER — Ambulatory Visit: Payer: BC Managed Care – PPO

## 2019-11-12 DIAGNOSIS — Z170 Estrogen receptor positive status [ER+]: Secondary | ICD-10-CM

## 2019-11-12 DIAGNOSIS — R600 Localized edema: Secondary | ICD-10-CM | POA: Diagnosis not present

## 2019-11-12 DIAGNOSIS — M25612 Stiffness of left shoulder, not elsewhere classified: Secondary | ICD-10-CM

## 2019-11-12 DIAGNOSIS — R293 Abnormal posture: Secondary | ICD-10-CM

## 2019-11-12 DIAGNOSIS — Z483 Aftercare following surgery for neoplasm: Secondary | ICD-10-CM

## 2019-11-12 DIAGNOSIS — C50412 Malignant neoplasm of upper-outer quadrant of left female breast: Secondary | ICD-10-CM

## 2019-11-12 NOTE — Therapy (Signed)
[place], [place], [address] Phone: [phone]   Fax:  [phone]  Physical Therapy Treatment  Patient Details  Name: Hannah Wallace MRN: [id number] Date of Birth: [birth year] Referring Provider (PT): Dr. Stark Klein   Encounter Date: 11/12/2019  PT End of Session - 11/12/19 0808    Visit Number  8    Number of Visits  10    Date for PT Re-Evaluation  11/24/19    PT Start Time  0805    PT Stop Time  0900    PT Time Calculation (min)  55 min    Activity Tolerance  Patient tolerated treatment well    Behavior During Therapy  [facility] for tasks assessed/performed       Past Medical History:  Diagnosis Date  . Basal cell carcinoma    on back  . Cancer [facility])    breast cancer    Past Surgical History:  Procedure Laterality Date  . BREAST LUMPECTOMY WITH RADIOACTIVE SEED AND SENTINEL LYMPH NODE BIOPSY Left 09/25/2019   Procedure: LEFT BREAST LUMPECTOMY WITH RADIOACTIVE SEED AND SENTINEL LYMPH NODE BIOPSY;  Surgeon: Stark Klein, MD;  Location: [facility];  Service: General;  Laterality: Left;  . COLONOSCOPY      There were no vitals filed for this visit.  Subjective Assessment - 11/12/19 0808    Subjective  Pt states that her arm is feeling really good. She states she did not feel alot of resistance from her exercises.    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 Patient underwent a left lumpectomy and 5 negative nodes removed on 09/25/2019. It is ER/PR positive and HER2 negative with a Ki67 of 2%.    Patient Stated Goals  See if my arm is ok and get rid of my cording    Currently in Pain?  No/denies    Pain Score  0-No pain                       [facility] Adult PT Treatment/Exercise - 11/12/19 0001      Exercises   Exercises  Other Exercises      Shoulder Exercises: Supine   Horizontal ABduction   Strengthening;Both;10 reps;Theraband   pt returned therapist demo   Theraband Level (Shoulder Horizontal ABduction)  Level 2 (Red)    Horizontal ABduction Limitations  demonstration with movement and tactile cueing for correct muscle activation and humeral alignment. VC for scapular squeeze.     External Rotation  Strengthening;Both;10 reps;Theraband   pt returned therapist demo   Theraband Level (Shoulder External Rotation)  Level 2 (Red)    External Rotation Limitations  VC for scapular squeeze    Flexion  Strengthening;Both;10 reps;Theraband   pt returned therapist demo, narrow and wide grip   Theraband Level (Shoulder Flexion)  Level 2 (Red)    Flexion Limitations  demonstration and tactile cueing for correct humeral alignment and hand placement.     Diagonals  Strengthening;10 reps;Theraband;Left   pt returned therapist demo   Theraband Level (Shoulder Diagonals)  Level 2 (Red)    Diagonals Limitations  Demonstration and tactile cueing for correct movement      Manual Therapy   Manual Therapy  Soft tissue mobilization;Manual Lymphatic Drainage (MLD)    Soft tissue mobilization  to superior/lateral breast in area of scar tissue/seroma with light lateral strokes; minimal improvement by end of session.  Manual Lymphatic Drainage (MLD)  In supine: short neck, swimming in the terminus, BIl axillary and inguinal nodes, established anterior inter-axillary anastomosis and L axillo-inguinal anastomosis, superior L breast, anterior inter-axillary anastomosis, inferior breast, L axillo-inguinal anastomosis, reworked all surfaces and 5 diaphragmatic breathes following STM on the L breast.              PT Education - 11/12/19 0901    Education Details  Pt will continue with self MLD and scapular series at home. She was provided with the strength after breast cancer exercise packet to look over before her next session on Friday.    Person(s) Educated  Patient    Methods   Explanation;Handout;Tactile cues;Demonstration;Verbal cues    Comprehension  Verbalized understanding;Returned demonstration          PT Long Term Goals - 11/06/19 0904      PT LONG TERM GOAL #1   Title  Patient will demonstrate she has regained full shoulder ROM and function post operatively compared to bsaeline assessment.    Status  Achieved      PT LONG TERM GOAL #2   Title  Patient will improve shoulder abduction to be >/= 164 for increased ease reaching overhead and to return to baseline.    Baseline  157 post op; 164 pre-op, 168 on 11/06/19    Status  Achieved      PT LONG TERM GOAL #3   Title  Patient will improve DASH score to 0 (zero) for improved shoulder function and to return to baseline function.    Status  On-going      PT LONG TERM GOAL #4   Title  Patient will be independent with performing self manual lymph drainage for left breast.    Status  Achieved            Plan - 11/12/19 0807    Clinical Impression Statement  Pt was reporting no difficulty with supine scapular series; she was provided with greater resistance today and went over the importance of slow deliberate movements and correct shoulder alignment in order to address the correct musculature. Continues with dense tissue noted at the L inferior/lateral breast near incision site that has improved significantly since the last time this physical therapist has seen patient; decreased minimally following light STM. MLD was performed following STM to the L breast and anterior trunk. Pt was issued Strength ABC packet to look over before her next session and discussed that modifications will be provided for any exercises that she is unable to perform. Pt will benefit from continued POC at this time.    Stability/Clinical Decision Making  Stable/Uncomplicated    PT Frequency  2x / week    PT Duration  4 weeks    PT Treatment/Interventions  ADLs/Self Care Home Management;Therapeutic exercise;Patient/family  education;Manual techniques;Manual lymph drainage;Passive range of motion;Scar mobilization;Therapeutic activities    PT Next Visit Plan  instruct in Strength ABC and then assess indep with this at the next visit, see if pt had any discomfort from supine scap, keep an eye on any cording return in the left UE, continue some MLD/myofascial work to the left lateral breast and axillary edema    PT Home Exercise Plan  Post op shoulder ROM HEP - encouraged her to do hands behind head and AAROM flexion both in supine; added closed chain abduction and neural stretching to imrpove cording, supine scap exercises    Consulted and Agree with Plan of Care  Patient  Patient will benefit from skilled therapeutic intervention in order to improve the following deficits and impairments:  Decreased knowledge of precautions, Impaired UE functional use, Pain, Postural dysfunction, Decreased range of motion, Increased edema  Visit Diagnosis: Localized edema  Aftercare following surgery for neoplasm  Abnormal posture  Stiffness of left shoulder, not elsewhere classified  Malignant neoplasm of upper-outer quadrant of left breast in female, estrogen receptor positive ([place])     Problem List Patient Active Problem List   Diagnosis Date Noted  . Malignant neoplasm of upper-outer quadrant of left breast in female, estrogen receptor positive ([place]) 09/09/2019    Ander Purpura, PT 11/12/2019, 9:06 AM  [place], [place], [address] Phone: [phone]   Fax:  [phone]  Name: Hannah Wallace MRN: [id number] Date of Birth: [birth year]

## 2019-11-14 ENCOUNTER — Inpatient Hospital Stay: Payer: BC Managed Care – PPO | Attending: Hematology and Oncology

## 2019-11-14 ENCOUNTER — Encounter: Payer: Self-pay | Admitting: Medical Oncology

## 2019-11-14 ENCOUNTER — Other Ambulatory Visit: Payer: Self-pay

## 2019-11-14 ENCOUNTER — Inpatient Hospital Stay: Payer: BC Managed Care – PPO | Admitting: Medical Oncology

## 2019-11-14 ENCOUNTER — Ambulatory Visit: Payer: BC Managed Care – PPO | Admitting: Rehabilitation

## 2019-11-14 ENCOUNTER — Encounter: Payer: Self-pay | Admitting: Rehabilitation

## 2019-11-14 DIAGNOSIS — Z170 Estrogen receptor positive status [ER+]: Secondary | ICD-10-CM | POA: Insufficient documentation

## 2019-11-14 DIAGNOSIS — Z006 Encounter for examination for normal comparison and control in clinical research program: Secondary | ICD-10-CM | POA: Insufficient documentation

## 2019-11-14 DIAGNOSIS — C50412 Malignant neoplasm of upper-outer quadrant of left female breast: Secondary | ICD-10-CM

## 2019-11-14 DIAGNOSIS — Z5189 Encounter for other specified aftercare: Secondary | ICD-10-CM | POA: Insufficient documentation

## 2019-11-14 DIAGNOSIS — R600 Localized edema: Secondary | ICD-10-CM

## 2019-11-14 DIAGNOSIS — R293 Abnormal posture: Secondary | ICD-10-CM

## 2019-11-14 DIAGNOSIS — Z5111 Encounter for antineoplastic chemotherapy: Secondary | ICD-10-CM | POA: Insufficient documentation

## 2019-11-14 DIAGNOSIS — Z483 Aftercare following surgery for neoplasm: Secondary | ICD-10-CM

## 2019-11-14 DIAGNOSIS — M25612 Stiffness of left shoulder, not elsewhere classified: Secondary | ICD-10-CM

## 2019-11-14 MED ORDER — ENSURE PRE-SURGERY PO LIQD: 296.0000 mL | Freq: Once | ORAL | Status: DC

## 2019-11-14 NOTE — Progress Notes (Signed)
[id number]: Consent I met with patient this morning after her chemo education class. Patient is here alone today for her appointments. I inquired with patient if she had any questions prior to the start of consent review, Protocol Version Date: 12/30/18 and patient stated that she had read over the study consent form and denied having any questions at this time. Patient and reviewed the study authorization and consent forms, page by page. Patient understands: the purpose of the study, that study participation is voluntary and that she can withdraw at any time if she so chose to, what she can expect when taking part in the study, any risks or benefits, the assessment time points, what exams, tests and procedures are part of this study (including blood samples, forms and assessments), any costs, how her medical information will be protected and where she can get more information on the study. The optional portion of the study was reviewed with the patient, as well, and patient declined to participate in this portion of the study where her samples and related information may be kept in a Bi[employer] for use in future health research. After all of patient's questions were answered to her satisfaction, patient proceeded to sign, date and time where indicated in consent Protocol Version Date: 12/30/18. Patient was thanked for her time and interest in study and encouraged to call Dr. Lindi Adie or myself with questions or concerns.  Patient was provided a copy of her signed consent and authorization forms for her records.  Patient signed ROI today.  Second eligibility review completed by research nurse Foye Spurling, RN. Patient meets all eligibility requirements and will be enrolled to the study.  Patient is scheduled to start chemotherapy on 11/20/2019 Maxwell Marion, RN, BSN, [facility] Clinical Research 11/14/2019 1:20 PM

## 2019-11-14 NOTE — Therapy (Signed)
[place], [place], [address] Phone: [phone]   Fax:  [phone]  Physical Therapy Treatment  Patient Details  Name: Hannah Wallace MRN: [id number] Date of Birth: 02/26/1956 Referring Provider (PT): Dr. Stark Klein   Encounter Date: 11/14/2019  PT End of Session - 11/14/19 0957    Visit Number  9    Number of Visits  10    Date for PT Re-Evaluation  11/24/19    PT Start Time  0801    PT Stop Time  3893    PT Time Calculation (min)  46 min    Activity Tolerance  Patient tolerated treatment well    Behavior During Therapy  [facility] for tasks assessed/performed       Past Medical History:  Diagnosis Date  . Basal cell carcinoma    on back  . Cancer [facility])    breast cancer    Past Surgical History:  Procedure Laterality Date  . BREAST LUMPECTOMY WITH RADIOACTIVE SEED AND SENTINEL LYMPH NODE BIOPSY Left 09/25/2019   Procedure: LEFT BREAST LUMPECTOMY WITH RADIOACTIVE SEED AND SENTINEL LYMPH NODE BIOPSY;  Surgeon: Stark Klein, MD;  Location: [place];  Service: General;  Laterality: Left;  . COLONOSCOPY      There were no vitals filed for this visit.  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 Patient underwent a left lumpectomy and 5 negative nodes removed on 09/25/2019. It is ER/PR positive and HER2 negative with a Ki67 of 2%.    Patient Stated Goals  See if my arm is ok and get rid of my cording    Currently in Pain?  No/denies   intermittent axillary pain              Katina Dung - 11/14/19 0001    Open a tight or new jar  No difficulty    Do heavy household chores (wash walls, wash floors)  No difficulty    Carry a shopping bag or briefcase  No difficulty    Wash your back  No difficulty    Use a knife to cut food  No difficulty     Recreational activities in which you take some force or impact through your arm, shoulder, or hand (golf, hammering, tennis)  No difficulty    During the past week, to what extent has your arm, shoulder or hand problem interfered with your normal social activities with family, friends, neighbors, or groups?  Not at all    During the past week, to what extent has your arm, shoulder or hand problem limited your work or other regular daily activities  Not at all    Arm, shoulder, or hand pain.  None    Tingling (pins and needles) in your arm, shoulder, or hand  None    Difficulty Sleeping  No difficulty    DASH Score  0 %             [facility] Adult PT Treatment/Exercise - 11/14/19 0001      Exercises   Exercises  Other Exercises    Other Exercises   performed all of the strength ABC packet instructed as written with the exception of seated butterfly stretch and step up.  All weight exercises performed with 2# x 10 and all stretches held x 15 seconds.  Discussed 118mn of aerobic activity per week and the importance of this during chemotherapy and radiation.               PT Education - 11/14/19 0957    Education Details  strength ABC program    Person(s) Educated  Patient    Methods  Explanation;Demonstration;Tactile cues;Verbal cues;Handout    Comprehension  Verbalized understanding;Returned demonstration;Verbal cues required;Tactile cues required          PT Long Term Goals - 11/14/19 0804      PT LONG TERM GOAL #1   Title  Patient will demonstrate she has regained full shoulder ROM and function post operatively compared to bsaeline assessment.    Status  Achieved      PT LONG TERM GOAL #2   Title  Patient will improve shoulder abduction to be >/= 164 for increased ease reaching overhead and to return to baseline.    Baseline  157 post op; 164 pre-op, 168 on 11/06/19    Status  Achieved      PT LONG TERM GOAL #3   Title  Patient will improve DASH score to 0 (zero)  for improved shoulder function and to return to baseline function.    Baseline  11.36 post op; 0 pre-op; 0% on 11/14/19    Status  Achieved      PT LONG TERM GOAL #4   Title  Patient will be independent with performing self manual lymph drainage for left breast.    Status  Achieved            Plan - 11/14/19 0958    Clinical Impression Statement  Pt has met all goals and is ready to transition to HEP with strength ABC program, self manual lymph drainage of the left breast, and use of foam and compression bras.    PT Frequency  2x / week    PT Duration  4 weeks    PT Treatment/Interventions  ADLs/Self Care Home Management;Therapeutic exercise;Patient/family education;Manual techniques;Manual lymph drainage;Passive range of motion;Scar mobilization;Therapeutic activities    Consulted and Agree with Plan of Care  Patient       Patient will benefit from skilled therapeutic intervention in order to improve the following deficits and impairments:     Visit Diagnosis: Localized edema  Aftercare following surgery for neoplasm  Abnormal posture  Stiffness of left shoulder, not elsewhere classified  Malignant neoplasm of upper-outer quadrant of left breast in female, estrogen receptor positive [place]     Problem List Patient Active Problem List   Diagnosis Date Noted  . Malignant neoplasm of upper-outer quadrant of left breast in female, estrogen receptor positive ([place] 09/09/2019    TStark Bray1/06/2020, 10:00 AM  [place] [address]Phone: [phone]  Fax:  [phone] Name: KFerol LaicheMRN: [id number] of Birth: [birth year] PHYSICAL THERAPY DISCHARGE SUMMARY  Visits from Start of Care: 9  Current functional level related to goals / functional outcomes:see above   Remaining deficits: Need for home self care and exercise   Education / Equipment: HEP, compression  bra, foam inserts Plan: Patient agrees to discharge.  Patient goals were met. Patient is being discharged due to meeting the stated rehab goals.  ?????     KShan Levans PT

## 2019-11-14 NOTE — Progress Notes (Signed)

## 2019-11-14 NOTE — Research (Signed)
DCP-001: Patient was referred to [phone] and is eligible for study.  I gave patient a brief overview of what the study is about and provided her the study consent for her review. Patient was provided copies of the DCP-001 consent and authorization forms for the "Use of a Clinical Trial Screening Tool to Address Cancer Health Disparities in the [place] Program" project. I explained t patient that the study includes individuals regardless of their decision to participate in an NCI clinical trial. The patient is aware that this study involves a one-time consent, and collection of demographic variables, with the majority of data collected from her medical record. It was noted that no patient identifiers are being reported via the screening tool. All patients questions to her satisfaction at this time, patient thanked and encouraged to call with question. Patient provided with my contact information Maxwell Marion, RN, BSN, [facility] Clinical Research 11/14/2019 4:55 PM

## 2019-11-15 ENCOUNTER — Other Ambulatory Visit (HOSPITAL_COMMUNITY): Payer: BC Managed Care – PPO

## 2019-11-17 ENCOUNTER — Other Ambulatory Visit (HOSPITAL_COMMUNITY)
Admission: RE | Admit: 2019-11-17 | Discharge: 2019-11-17 | Disposition: A | Discharge status: Home / Self Care | Payer: BC Managed Care – PPO | Source: Ambulatory Visit | Attending: General Surgery | Admitting: General Surgery

## 2019-11-17 DIAGNOSIS — Z20822 Contact with and (suspected) exposure to covid-19: Secondary | ICD-10-CM | POA: Diagnosis not present

## 2019-11-17 DIAGNOSIS — Z01812 Encounter for preprocedural laboratory examination: Secondary | ICD-10-CM | POA: Insufficient documentation

## 2019-11-17 LAB — SARS CORONAVIRUS 2 (TAT 6-24 HRS): SARS Coronavirus 2: NEGATIVE

## 2019-11-18 NOTE — Progress Notes (Signed)
Nutrition  Email message was forwarded to RD from [facility], Nurse Navigator as patient with questions regarding protein intake.    Chart reviewed.  RD responded to patient's question via email and encouraged patient to reach out with further questions.   Joli B. Zenia Resides, [place], [place] Registered Dietitian [phone] (pager)

## 2019-11-18 NOTE — H&P (Signed)
Hannah Wallace Location: [facility] Patient #: [id number] DOB: 09-16-1956 Single / Language: Cleophus Molt / Race: White Female   History of Present Illness The patient is a 64 year old female who presents for a follow-up for Breast cancer. Pt is a 64 yo F referred by Dr. Luan Pulling for a new diagnosis of left breast cancer 08/2019. She presented with a screening detected left breast mass. Diagnostic imaging was obtained which showed a 1.4 cm spiculated mass at 1 o'clock. A core needle biopsy was performed which showed an invasive ductal carcinoma, grade 1-2, ER/PR positive, Her 2 negative, Ki 67 2%. There was a slightly abnormal intramammary node that was also biopsied and was negative and concordant. She has a history of basal cell carcinoma, but no other cancers. She had a history of a sister with hodgkin's lymphoma and a father with lung cancer. Her paternal grandmother had breast cancer at age 6. She had menarche at age 7. She is a G0. She is post menopausal x 5 years. She did not take HRT or OCPs. She had a colonoscopy in 2012, but has not had a bone density study.   Pt underwent left seed localized lumpectomy with sentinel lymph node biopsy. Margins and nodes were negative. She is a little sore, but doing well. She has some tightness in her axilla.   pathology 09/25/2019 A. BREAST, LEFT W/SEED, LUMPECTOMY: - Invasive ductal carcinoma, Nottingham grade 2 of 3, 1.2 cm - Ductal carcinoma in-situ, intermediate grade - Margins uninvolved by carcinoma (0.1 cm; inferior margin) - Lymphovascular space invasion present - Previous biopsy site changes present - Fibroadenomatoid nodule with calcifications - See oncology table and comment below  B. SENTINEL LYMPH NODE, LEFT AXILLARY #1, BIOPSY: - No carcinoma identified in one lymph node (0/1)  C. SENTINEL LYMPH NODE, LEFT AXILLARY #2, BIOPSY: - No carcinoma identified in one lymph node (0/1)  D. SENTINEL LYMPH  NODE, LEFT AXILLARY #3, BIOPSY: - No carcinoma identified in one lymph node (0/1)  E. SENTINEL LYMPH NODE, LEFT AXILLARY #4, BIOPSY: - No carcinoma identified in one lymph node (0/1)  F. SENTINEL LYMPH NODE, LEFT AXLLARY #5, BIOPSY: - No carcinoma identified in one lymph node (0/1)   Allergies  No Known Allergies  Allergies Reconciled   Medication History Multiple Vitamin (1 (one) Oral) Active. Vitamin C (500MG  Tablet, 1 (one) Oral) Active. Vitamin B Complex (1 (one) Oral) Active. Vitamin K2-Vitamin D3 (45-2000MCG-UNIT Capsule, Oral) Active. Fish Oil (300MG  Capsule, Oral) Active. Medications Reconciled    Review of Systems All other systems negative  Vitals  Weight: 207.4 lb Height: 69in Body Surface Area: 2.1 m Body Mass Index: 30.63 kg/m  Temp.: 98.74F  Pulse: 130 (Regular)  BP: 130/90 (Sitting, Left Arm, Standard)       Physical Exam Breast Note: small left breast seroma. + tendon cord in axilla. no erythema.     Assessment & Plan  MALIGNANT NEOPLASM OF UPPER-OUTER QUADRANT OF LEFT BREAST IN FEMALE, ESTROGEN RECEPTOR POSITIVE (C50.412) Impression: Oncotype high risk.  Will get chemo then proceed to radiation.   This will be followed by antiestrogen tx.

## 2019-11-19 ENCOUNTER — Other Ambulatory Visit: Payer: Self-pay

## 2019-11-19 ENCOUNTER — Encounter (HOSPITAL_BASED_OUTPATIENT_CLINIC_OR_DEPARTMENT_OTHER)
Admission: RE | Disposition: A | Discharge status: Home / Self Care | Payer: Self-pay | Source: Home / Self Care | Attending: General Surgery

## 2019-11-19 ENCOUNTER — Encounter (HOSPITAL_BASED_OUTPATIENT_CLINIC_OR_DEPARTMENT_OTHER): Payer: Self-pay | Admitting: General Surgery

## 2019-11-19 ENCOUNTER — Ambulatory Visit (HOSPITAL_BASED_OUTPATIENT_CLINIC_OR_DEPARTMENT_OTHER): Payer: BC Managed Care – PPO | Admitting: Certified Registered"

## 2019-11-19 ENCOUNTER — Ambulatory Visit (HOSPITAL_COMMUNITY): Payer: BC Managed Care – PPO

## 2019-11-19 ENCOUNTER — Ambulatory Visit (HOSPITAL_BASED_OUTPATIENT_CLINIC_OR_DEPARTMENT_OTHER)
Admission: RE | Admit: 2019-11-19 | Discharge: 2019-11-19 | Disposition: A | Discharge status: Home / Self Care | Payer: BC Managed Care – PPO | Attending: General Surgery | Admitting: General Surgery

## 2019-11-19 ENCOUNTER — Other Ambulatory Visit: Payer: Self-pay | Admitting: Medical Oncology

## 2019-11-19 DIAGNOSIS — C50412 Malignant neoplasm of upper-outer quadrant of left female breast: Secondary | ICD-10-CM | POA: Insufficient documentation

## 2019-11-19 DIAGNOSIS — Z95828 Presence of other vascular implants and grafts: Secondary | ICD-10-CM

## 2019-11-19 DIAGNOSIS — Z85828 Personal history of other malignant neoplasm of skin: Secondary | ICD-10-CM | POA: Diagnosis not present

## 2019-11-19 DIAGNOSIS — Z170 Estrogen receptor positive status [ER+]: Secondary | ICD-10-CM

## 2019-11-19 DIAGNOSIS — Z79899 Other long term (current) drug therapy: Secondary | ICD-10-CM | POA: Diagnosis not present

## 2019-11-19 DIAGNOSIS — Z803 Family history of malignant neoplasm of breast: Secondary | ICD-10-CM | POA: Diagnosis not present

## 2019-11-19 HISTORY — PX: PORTACATH PLACEMENT: SHX2246

## 2019-11-19 SURGERY — INSERTION, TUNNELED CENTRAL VENOUS DEVICE, WITH PORT
Anesthesia: General | Site: Chest | Laterality: Left

## 2019-11-19 MED ORDER — DEXAMETHASONE SODIUM PHOSPHATE 10 MG/ML IJ SOLN
INTRAMUSCULAR | Status: DC | PRN
  Administered 2019-11-19: 5 mg via INTRAVENOUS

## 2019-11-19 MED ORDER — SCOPOLAMINE 1 MG/3DAYS TD PT72: 1.0000 | MEDICATED_PATCH | TRANSDERMAL | Status: DC

## 2019-11-19 MED ORDER — CEFAZOLIN SODIUM-DEXTROSE 2-4 GM/100ML-% IV SOLN
2.0000 g | INTRAVENOUS | Status: AC
  Administered 2019-11-19: 13:00:00 2 g via INTRAVENOUS

## 2019-11-19 MED ORDER — ONDANSETRON HCL 4 MG/2ML IJ SOLN: 4.0000 mg | Freq: Once | INTRAMUSCULAR | Status: DC | PRN

## 2019-11-19 MED ORDER — PROPOFOL 10 MG/ML IV BOLUS
INTRAVENOUS | Status: DC | PRN
  Administered 2019-11-19: 30 mg via INTRAVENOUS
  Administered 2019-11-19: 200 mg via INTRAVENOUS

## 2019-11-19 MED ORDER — MIDAZOLAM HCL 2 MG/2ML IJ SOLN
INTRAMUSCULAR | Status: AC
  Filled 2019-11-19: qty 2

## 2019-11-19 MED ORDER — GABAPENTIN 100 MG PO CAPS: 100.0000 mg | ORAL_CAPSULE | ORAL | Status: DC

## 2019-11-19 MED ORDER — LIDOCAINE-EPINEPHRINE 0.5 %-1:200000 IJ SOLN
INTRAMUSCULAR | Status: DC | PRN
  Administered 2019-11-19: 10 mL

## 2019-11-19 MED ORDER — PHENYLEPHRINE 40 MCG/ML (10ML) SYRINGE FOR IV PUSH (FOR BLOOD PRESSURE SUPPORT)
PREFILLED_SYRINGE | INTRAVENOUS | Status: DC | PRN
  Administered 2019-11-19 (×7): 80 ug via INTRAVENOUS

## 2019-11-19 MED ORDER — FENTANYL CITRATE (PF) 100 MCG/2ML IJ SOLN: 25.0000 ug | INTRAMUSCULAR | Status: DC | PRN

## 2019-11-19 MED ORDER — FENTANYL CITRATE (PF) 100 MCG/2ML IJ SOLN
50.0000 ug | INTRAMUSCULAR | Status: DC | PRN
  Administered 2019-11-19 (×2): 50 ug via INTRAVENOUS

## 2019-11-19 MED ORDER — PROPOFOL 500 MG/50ML IV EMUL
INTRAVENOUS | Status: DC | PRN
  Administered 2019-11-19: 150 ug/kg/min via INTRAVENOUS

## 2019-11-19 MED ORDER — GABAPENTIN 300 MG PO CAPS
ORAL_CAPSULE | ORAL | Status: AC
  Filled 2019-11-19: qty 1

## 2019-11-19 MED ORDER — BUPIVACAINE HCL (PF) 0.25 % IJ SOLN
INTRAMUSCULAR | Status: DC | PRN
  Administered 2019-11-19: 10 mL

## 2019-11-19 MED ORDER — CEFAZOLIN SODIUM-DEXTROSE 2-4 GM/100ML-% IV SOLN
INTRAVENOUS | Status: AC
  Filled 2019-11-19: qty 100

## 2019-11-19 MED ORDER — HEPARIN (PORCINE) IN NACL 1000-0.9 UT/500ML-% IV SOLN
INTRAVENOUS | Status: AC
  Filled 2019-11-19: qty 500

## 2019-11-19 MED ORDER — HEPARIN (PORCINE) IN NACL 2-0.9 UNITS/ML
INTRAMUSCULAR | Status: AC | PRN
  Administered 2019-11-19: 1

## 2019-11-19 MED ORDER — HEPARIN SOD (PORK) LOCK FLUSH 100 UNIT/ML IV SOLN
INTRAVENOUS | Status: DC | PRN
  Administered 2019-11-19: 500 [IU]

## 2019-11-19 MED ORDER — LIDOCAINE 2% (20 MG/ML) 5 ML SYRINGE
INTRAMUSCULAR | Status: DC | PRN
  Administered 2019-11-19: 60 mg via INTRAVENOUS

## 2019-11-19 MED ORDER — MIDAZOLAM HCL 2 MG/2ML IJ SOLN: 1.0000 mg | INTRAMUSCULAR | Status: DC | PRN

## 2019-11-19 MED ORDER — HEPARIN SOD (PORK) LOCK FLUSH 100 UNIT/ML IV SOLN
INTRAVENOUS | Status: AC
  Filled 2019-11-19: qty 5

## 2019-11-19 MED ORDER — FENTANYL CITRATE (PF) 100 MCG/2ML IJ SOLN
INTRAMUSCULAR | Status: AC
  Filled 2019-11-19: qty 2

## 2019-11-19 MED ORDER — ACETAMINOPHEN 500 MG PO TABS: 1000.0000 mg | ORAL_TABLET | ORAL | Status: DC

## 2019-11-19 MED ORDER — CHLORHEXIDINE GLUCONATE CLOTH 2 % EX PADS: 6.0000 | MEDICATED_PAD | Freq: Once | CUTANEOUS | Status: DC

## 2019-11-19 MED ORDER — GABAPENTIN 100 MG PO CAPS
ORAL_CAPSULE | ORAL | Status: AC
  Filled 2019-11-19: qty 1

## 2019-11-19 MED ORDER — ACETAMINOPHEN 500 MG PO TABS
ORAL_TABLET | ORAL | Status: AC
  Filled 2019-11-19: qty 2

## 2019-11-19 MED ORDER — PROPOFOL 10 MG/ML IV BOLUS
INTRAVENOUS | Status: AC
  Filled 2019-11-19: qty 20

## 2019-11-19 MED ORDER — KETOROLAC TROMETHAMINE 30 MG/ML IJ SOLN
INTRAMUSCULAR | Status: DC | PRN
  Administered 2019-11-19: 30 mg via INTRAVENOUS

## 2019-11-19 MED ORDER — MEPERIDINE HCL 25 MG/ML IJ SOLN: 6.2500 mg | INTRAMUSCULAR | Status: DC | PRN

## 2019-11-19 MED ORDER — ONDANSETRON HCL 4 MG/2ML IJ SOLN
INTRAMUSCULAR | Status: DC | PRN
  Administered 2019-11-19: 4 mg via INTRAVENOUS

## 2019-11-19 MED ORDER — LACTATED RINGERS IV SOLN: INTRAVENOUS | Status: DC

## 2019-11-19 MED ORDER — SCOPOLAMINE 1 MG/3DAYS TD PT72
MEDICATED_PATCH | TRANSDERMAL | Status: AC
  Filled 2019-11-19: qty 1

## 2019-11-19 MED ORDER — EPHEDRINE SULFATE-NACL 50-0.9 MG/10ML-% IV SOSY
PREFILLED_SYRINGE | INTRAVENOUS | Status: DC | PRN
  Administered 2019-11-19 (×2): 10 mg via INTRAVENOUS

## 2019-11-19 SURGICAL SUPPLY — 39 items
BAG DECANTER FOR FLEXI CONT (MISCELLANEOUS) ×2 IMPLANT
BLADE HEX COATED 2.75 (ELECTRODE) ×2 IMPLANT
BLADE SURG 11 STRL SS (BLADE) ×2 IMPLANT
BLADE SURG 15 STRL LF DISP TIS (BLADE) ×1 IMPLANT
BLADE SURG 15 STRL SS (BLADE) ×1
CHLORAPREP W/TINT 26 (MISCELLANEOUS) ×2 IMPLANT
COVER BACK TABLE 60X90IN (DRAPES) ×2 IMPLANT
COVER MAYO STAND STRL (DRAPES) ×2 IMPLANT
DERMABOND ADVANCED (GAUZE/BANDAGES/DRESSINGS) ×1
DERMABOND ADVANCED .7 DNX12 (GAUZE/BANDAGES/DRESSINGS) ×1 IMPLANT
DRAPE C-ARM 42X72 X-RAY (DRAPES) ×2 IMPLANT
DRAPE LAPAROTOMY TRNSV 102X78 (DRAPES) ×2 IMPLANT
DRAPE UTILITY XL STRL (DRAPES) ×2 IMPLANT
DRSG TEGADERM 4X4.75 (GAUZE/BANDAGES/DRESSINGS) ×4 IMPLANT
ELECT REM PT RETURN 9FT ADLT (ELECTROSURGICAL) ×2
ELECTRODE REM PT RTRN 9FT ADLT (ELECTROSURGICAL) ×1 IMPLANT
GAUZE SPONGE 4X4 12PLY STRL LF (GAUZE/BANDAGES/DRESSINGS) ×4 IMPLANT
GLOVE BIO SURGEON STRL SZ 6 (GLOVE) ×2 IMPLANT
GLOVE BIO SURGEON STRL SZ 6.5 (GLOVE) ×2 IMPLANT
GLOVE BIOGEL PI IND STRL 6.5 (GLOVE) ×1 IMPLANT
GLOVE BIOGEL PI INDICATOR 6.5 (GLOVE) ×1
GOWN STRL REUS W/ TWL LRG LVL3 (GOWN DISPOSABLE) ×1 IMPLANT
GOWN STRL REUS W/TWL 2XL LVL3 (GOWN DISPOSABLE) ×2 IMPLANT
GOWN STRL REUS W/TWL LRG LVL3 (GOWN DISPOSABLE) ×1
IV CONNECTOR ONE LINK NDLESS (IV SETS) ×4 IMPLANT
KIT PORT POWER 8FR ISP CVUE (Port) ×4 IMPLANT
NEEDLE HYPO 25X1 1.5 SAFETY (NEEDLE) ×2 IMPLANT
PACK BASIN DAY SURGERY FS (CUSTOM PROCEDURE TRAY) ×2 IMPLANT
PENCIL SMOKE EVACUATOR (MISCELLANEOUS) ×2 IMPLANT
SLEEVE SCD COMPRESS KNEE MED (MISCELLANEOUS) ×2 IMPLANT
SUT MNCRL AB 4-0 PS2 18 (SUTURE) ×2 IMPLANT
SUT PROLENE 2 0 SH DA (SUTURE) ×8 IMPLANT
SUT VIC AB 3-0 SH 27 (SUTURE) ×1
SUT VIC AB 3-0 SH 27X BRD (SUTURE) ×1 IMPLANT
SUT VICRYL 3-0 CR8 SH (SUTURE) IMPLANT
SYR 10ML LL (SYRINGE) ×2 IMPLANT
SYR 5ML LUER SLIP (SYRINGE) ×2 IMPLANT
SYR CONTROL 10ML LL (SYRINGE) ×2 IMPLANT
TOWEL GREEN STERILE FF (TOWEL DISPOSABLE) ×2 IMPLANT

## 2019-11-19 NOTE — Anesthesia Procedure Notes (Signed)
Procedure Name: LMA Insertion Date/Time: 11/19/2019 1:22 PM Performed by: Gwyndolyn Saxon, CRNA Pre-anesthesia Checklist: Patient identified, Emergency Drugs available, Suction available and Patient being monitored Patient Re-evaluated:Patient Re-evaluated prior to induction Oxygen Delivery Method: Circle system utilized Preoxygenation: Pre-oxygenation with 100% oxygen Induction Type: IV induction Ventilation: Mask ventilation without difficulty LMA: LMA inserted LMA Size: 4.0 Number of attempts: 1 Placement Confirmation: positive ETCO2 and breath sounds checked- equal and bilateral Tube secured with: Tape Dental Injury: Teeth and Oropharynx as per pre-operative assessment

## 2019-11-19 NOTE — Interval H&P Note (Signed)
History and Physical Interval Note:  11/19/2019 12:44 PM  Hannah Wallace  has presented today for surgery, with the diagnosis of breast cancer.  The various methods of treatment have been discussed with the patient and family. After consideration of risks, benefits and other options for treatment, the patient has consented to  Procedure(s): INSERTION PORT-A-CATH WITH POSSIBLE ULTRASOUND (N/A) as a surgical intervention.  The patient's history has been reviewed, patient examined, no change in status, stable for surgery.  I have reviewed the patient's chart and labs.  Questions were answered to the patient's satisfaction.     Stark Klein

## 2019-11-19 NOTE — Progress Notes (Signed)
Patient Care Team: Joella Prince as PCP - General (Physician Assistant) Mauro Kaufmann, RN as Oncology Nurse Navigator Rockwell Germany, RN as Oncology Nurse Navigator  DIAGNOSIS:    ICD-10-CM   1. Malignant neoplasm of upper-outer quadrant of left breast in female, estrogen receptor positive ([place])  C50.412    Z17.0     SUMMARY OF ONCOLOGIC HISTORY: Oncology History  Malignant neoplasm of upper-outer quadrant of left breast in female, estrogen receptor positive ([place])  09/03/2019 Initial Diagnosis   Routine screening mammogram detected a 1.4cm indeterminate left breast mass. US showed a 1.0cm left breast mass and a 0.5cm lymph node with cortical thickening at the 2:00 position in the left breast. 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   09/10/2019 Cancer Staging   Staging form: Breast, AJCC 8th Edition - Clinical stage from 09/10/2019: Stage IA (cT1c, cN0, cM0, G2, ER+, PR+, HER2-) - Signed by Nicholas Lose, MD on 09/10/2019   09/25/2019 Surgery   Left lumpectomy [facility]): IDC, grade 2, 1.2cm, with intermediate grade DCIS, 5 axillary lymph nodes negative, clear margins.    09/25/2019 Cancer Staging   Staging form: Breast, AJCC 8th Edition - Pathologic stage from 09/25/2019: Stage IA (pT1c, pN0, cM0, G2, ER+, PR+, HER2-) - Signed by Gardenia Phlegm, NP on 10/15/2019   11/03/2019 Oncotype testing   Oncotype score 26   11/20/2019 -  Chemotherapy   The patient had palonosetron (ALOXI) injection 0.25 mg, 0.25 mg, Intravenous,  Once, 0 of 4 cycles pegfilgrastim-jmdb (FULPHILA) injection 6 mg, 6 mg, Subcutaneous,  Once, 0 of 4 cycles cyclophosphamide (CYTOXAN) 1,260 mg in sodium chloride 0.9 % 250 mL chemo infusion, 600 mg/m2 = 1,260 mg, Intravenous,  Once, 0 of 4 cycles DOCEtaxel (TAXOTERE) 160 mg in sodium chloride 0.9 % 250 mL chemo infusion, 75 mg/m2 = 160 mg, Intravenous,  Once, 0 of 4 cycles  for  chemotherapy treatment.      CHIEF COMPLIANT: Cycle 1 Taxotere and Cytoxan  INTERVAL HISTORY: Hannah Wallace is a 64 y.o. with above-mentioned history of left breast cancer who underwent a lumpectomy and is currently on neoadjuvant chemotherapy with Taxotere and Cytoxan. Her port was placed by Dr. Barry Dienes on 11/18/18. She presents to the clinic today for cycle 1.   ALLERGIES:  has No Known Allergies.  MEDICATIONS:  Current Outpatient Medications  Medication Sig Dispense Refill  . b complex vitamins capsule Take 1 capsule by mouth daily.    Marland Kitchen dexamethasone (DECADRON) 4 MG tablet Take 1 tablet (4 mg total) by mouth daily. Take 1 tablet day after chemotherapy and 1 tablet 2 days after chemotherapy with food 8 tablet 0  . lidocaine-prilocaine (EMLA) cream Apply to affected area once 30 g 3  . LORazepam (ATIVAN) 0.5 MG tablet Take 1 tablet (0.5 mg total) by mouth at bedtime as needed for sleep. 30 tablet 0  . Multiple Vitamin (MULTIVITAMIN) tablet Take 1 tablet by mouth daily.    . Omega-3 Fatty Acids (FISH OIL) 1000 MG CAPS Take 1,000 mg by mouth 2 (two) times daily.    . ondansetron (ZOFRAN) 8 MG tablet Take 1 tablet (8 mg total) by mouth 2 (two) times daily as needed for refractory nausea / vomiting. Start on day 3 after chemo. 30 tablet 1  . Polyethyl Glycol-Propyl Glycol (SYSTANE) 0.4-0.3 % SOLN Place 1 drop into both eyes daily.    . prochlorperazine (  COMPAZINE) 10 MG tablet Take 1 tablet (10 mg total) by mouth every 6 (six) hours as needed (Nausea or vomiting). 30 tablet 1  . UNABLE TO FIND [employer] Thyroid Complete    . vitamin C (ASCORBIC ACID) 500 MG tablet Take 500 mg by mouth daily.    . Vitamin D-Vitamin K (VITAMIN K2-VITAMIN D3 PO) Take 1 tablet by mouth daily. D3 125 mcg K2 90 mcg     No current facility-administered medications for this visit.    PHYSICAL EXAMINATION: ECOG PERFORMANCE STATUS: 1 - Symptomatic but completely ambulatory  Vitals:   11/20/19 0805  BP: 129/87   Pulse: 84  Resp: 17  Temp: 97.8 F (36.6 C)  SpO2: 100%   Filed Weights   11/20/19 0805  Weight: 196 lb 12.8 oz (89.3 kg)    LABORATORY DATA:  I have reviewed the data as listed CMP Latest Ref Rng & Units 09/10/2019  Glucose 70 - 99 mg/dL 85  BUN 8 - 23 mg/dL 18  Creatinine 0.44 - 1.00 mg/dL 0.87  Sodium 135 - 145 mmol/L 140  Potassium 3.5 - 5.1 mmol/L 4.1  Chloride 98 - 111 mmol/L 104  CO2 22 - 32 mmol/L 25  Calcium 8.9 - 10.3 mg/dL 9.5  Total Protein 6.5 - 8.1 g/dL 7.6  Total Bilirubin 0.3 - 1.2 mg/dL 0.5  Alkaline Phos 38 - 126 U/L 69  AST 15 - 41 U/L 19  ALT 0 - 44 U/L 47(H)    Lab Results  Component Value Date   WBC 9.2 09/22/2019   HGB 15.3 (H) 09/22/2019   HCT 45.9 09/22/2019   MCV 89.8 09/22/2019   PLT 242 09/22/2019   NEUTROABS 4.1 09/10/2019    ASSESSMENT & PLAN:  Malignant neoplasm of upper-outer quadrant of left breast in female, estrogen receptor positive ([place]) 09/25/2019:Left lumpectomy ([place]): IDC, grade 2, 1.2cm, with intermediate grade DCIS, 5 axillary lymph nodes negative, clear margins.HER-2 - by FISH, ER+ 90%, PR+ 100%, Ki67 2%, Oncotype DX score 26  Treatment plan:  1. Adjuvant chemotherapy with Taxotere and Cytoxan every 3 weeks x 4 2.  Adjuvant radiation therapy followed by 3.  Adjuvant antiestrogen therapy ------------------------------------------------------------------------------------------------------------------------------------------------------------------- Current treatment: Cycle 1 day 1 Taxotere and Cytoxan Chemo consent obtained, chemo education completed Labs reviewed Port has been placed yesterday. Patient forgot to take dexamethasone yesterday.  We will give it in intravenous form today. Port related discomfort: Doing quite well today. I encouraged the patient to stay active during chemo and walk as much as she can tolerate.  Return to clinic in 1 week for toxicity check   No orders of the defined types were  placed in this encounter.  The patient has a good understanding of the overall plan. she agrees with it. she will call with any problems that may develop before the next visit here.  Total time spent: 30 mins including face to face time and time spent for planning, charting and coordination of care  Nicholas Lose, MD 11/20/2019  I, Cloyde Reams Dorshimer, am acting as scribe for Dr. Nicholas Lose.  I have reviewed the above documentation for accuracy and completeness, and I agree with the above.

## 2019-11-19 NOTE — Discharge Instructions (Addendum)
[place] Office Phone Number [phone]   POST OP INSTRUCTIONS  Always review your discharge instruction sheet given to you by the facility where your surgery was performed.  IF YOU HAVE DISABILITY OR FAMILY LEAVE FORMS, YOU MUST BRING THEM TO THE OFFICE FOR PROCESSING.  DO NOT GIVE THEM TO YOUR DOCTOR.  1. A prescription for pain medication may be given to you upon discharge.  Take your pain medication as prescribed, if needed.  If narcotic pain medicine is not needed, then you may take acetaminophen (Tylenol) or ibuprofen (Advil) as needed. 2. Take your usually prescribed medications unless otherwise directed 3. If you need a refill on your pain medication, please contact your pharmacy.  They will contact our office to request authorization.  Prescriptions will not be filled after 5pm or on week-ends. 4. You should eat very light the first 24 hours after surgery, such as soup, crackers, pudding, etc.  Resume your normal diet the day after surgery 5. It is common to experience some constipation if taking pain medication after surgery.  Increasing fluid intake and taking a stool softener will usually help or prevent this problem from occurring.  A mild laxative (Milk of Magnesia or Miralax) should be taken according to package directions if there are no bowel movements after 48 hours. 6. You may shower in 48 hours.  The surgical glue will flake off in 2-3 weeks.   7. ACTIVITIES:  No strenuous activity or heavy lifting for 1 week.   a. You may drive when you no longer are taking prescription pain medication, you can comfortably wear a seatbelt, and you can safely maneuver your car and apply brakes. b. RETURN TO WORK:  __________to be determined._______________ Dennis Bast should see your doctor in the office for a follow-up appointment approximately three-four weeks after your surgery.    WHEN TO CALL YOUR DOCTOR: 1. Fever over 101.0 2. Nausea and/or vomiting. 3. Extreme swelling  or bruising. 4. Continued bleeding from incision. 5. Increased pain, redness, or drainage from the incision.  The clinic staff is available to answer your questions during regular business hours.  Please don't hesitate to call and ask to speak to one of the nurses for clinical concerns.  If you have a medical emergency, go to the nearest emergency room or call 911.  A surgeon from [facility] is always on call at the hospital.  For further questions, please visit [email or web address]     Post Anesthesia Home Care Instructions  Activity: Get plenty of rest for the remainder of the day. A responsible individual must stay with you for 24 hours following the procedure.  For the next 24 hours, DO NOT: -Drive a car -Paediatric nurse -Drink alcoholic beverages -Take any medication unless instructed by your physician -Make any legal decisions or sign important papers.  Meals: Start with liquid foods such as gelatin or soup. Progress to regular foods as tolerated. Avoid greasy, spicy, heavy foods. If nausea and/or vomiting occur, drink only clear liquids until the nausea and/or vomiting subsides. Call your physician if vomiting continues.  Special Instructions/Symptoms: Your throat may feel dry or sore from the anesthesia or the breathing tube placed in your throat during surgery. If this causes discomfort, gargle with warm salt water. The discomfort should disappear within 24 hours.  If you had a scopolamine patch placed behind your ear for the management of post- operative nausea and/or vomiting:  1. The medication in the patch is effective for 72 hours, after which  it should be removed.  Wrap patch in a tissue and discard in the trash. Wash hands thoroughly with soap and water. 2. You may remove the patch earlier than 72 hours if you experience unpleasant side effects which may include dry mouth, dizziness or visual disturbances. 3. Avoid touching the patch. Wash your  hands with soap and water after contact with the patch.     No ibuprofen until after 8pm tonight. No tylenol until after 6pm tonight.

## 2019-11-19 NOTE — Op Note (Signed)
PREOPERATIVE DIAGNOSIS:  Left breast cancer     POSTOPERATIVE DIAGNOSIS:  Same     PROCEDURE: Left subclavian port placement, Bard ClearVue  Power Port, MRI safe, 8-French.      SURGEON:  Stark Klein, MD      ANESTHESIA:  General   FINDINGS:  Good venous return, easy flush, and tip of the catheter and   SVC 22 cm.      SPECIMEN:  None.      ESTIMATED BLOOD LOSS:  Minimal.      COMPLICATIONS:  None known.      PROCEDURE:  Pt was identified in the holding area and taken to   the operating room, where patient was placed supine on the operating room   table.  General anesthesia was induced.  Patient's arms were tucked and the upper   chest and neck were prepped and draped in sterile fashion.  Time-out was   performed according to the surgical safety check list.  When all was   correct, we continued.   Local anesthetic was administered over this   area at the angle of the clavicle.  The vein was accessed with 2 pass(es) of the needle. There was good venous return and the wire passed easily with no ectopy.   Fluoroscopy was used to confirm that the wire was in the vena cava.      The patient was placed back level and the area for the pocket was anethetized   with local anesthetic.  A 3-cm transverse incision was made with a #15   blade.  Cautery was used to divide the subcutaneous tissues down to the   pectoralis muscle.  An [employer] retractor was used to elevate the skin   while a pocket was created on top of the pectoralis fascia.  The port   was placed into the pocket to confirm that it was of adequate size.  The   catheter was preattached to the port.  The port was then secured to the   pectoralis fascia with four 2-0 Prolene sutures.  These were clamped and   not tied down yet.    The catheter was tunneled through to the wire exit   site.  The catheter was placed along the wire to determine what length it should be to be in the SVC.  The catheter was cut at 17 cm.  The  tunneler sheath and dilator were passed over the wire and the dilator and wire were removed.  The catheter was advanced through the tunneler sheath and the tunneler sheath was pulled away.  Care was taken to keep the catheter in the tunneler sheath as this occurred. This was advanced and the tunneler sheath was removed.  There was good venous   return and easy flush of the catheter.  Unfortunately, when the wire was removed, the tip of the catheter retracted back.  The catheter was disconnected from the port and the wire reinserted into the catheter.  The longer portion of catheter was used initially.  However, it would not pass under the without the dilator sheath.  A new catheter was opened.  The tunneler sheath was placed over the wire.  The new catheter was used and passed over the wire.  The wire was removed.  The catheter was pulled back until it was at a good position and was cut at 22 cm.  (this means catheter length was 24 cm approximately).    The catheter was then secured to the  port with the locking cap.  The catheter was reassessed.  It had retracted a bit, but the tip was still at the SVC.  It was secured in place.    The Prolene sutures were tied down to the pectoral fascia.  The skin was reapproximated using 3-0 Vicryl interrupted deep dermal sutures.  The skin  was then closed using 4-0 Monocryl in a subcuticular fashion.  The port was flushed with concentrated heparin flush as well.  The wounds were then cleaned, dried, and dressed with Dermabond.  The catheter was left accessed.    The patient was awakened from anesthesia and taken to the PACU in stable condition.  Needle, sponge, and instrument counts were correct.               Stark Klein, MD

## 2019-11-19 NOTE — Anesthesia Postprocedure Evaluation (Signed)
Anesthesia Post Note  Patient: Hannah Wallace  Procedure(s) Performed: INSERTION PORT-A-CATH (Left Chest)     Patient location during evaluation: PACU Anesthesia Type: General Level of consciousness: awake and alert and oriented Pain management: pain level controlled Vital Signs Assessment: post-procedure vital signs reviewed and stable Respiratory status: spontaneous breathing, nonlabored ventilation and respiratory function stable Cardiovascular status: blood pressure returned to baseline and stable Postop Assessment: no apparent nausea or vomiting Anesthetic complications: no    Last Vitals:  Vitals:   11/19/19 1445 11/19/19 1500  BP: 116/83 116/83  Pulse: 76   Resp: (!) 8   Temp:  36.5 C  SpO2: 99%     Last Pain:  Vitals:   11/19/19 1500  TempSrc:   PainSc: 0-No pain                 Rebbeca Sheperd A.

## 2019-11-19 NOTE — Anesthesia Preprocedure Evaluation (Addendum)
Anesthesia Evaluation  Patient identified by MRN, date of birth, ID band Patient awake    Reviewed: Allergy & Precautions, NPO status , Patient's Chart, lab work & pertinent test results  Airway Mallampati: II  TM Distance: >3 FB Neck ROM: Full    Dental no notable dental hx. (+) Teeth Intact   Pulmonary neg pulmonary ROS,    Pulmonary exam normal breath sounds clear to auscultation       Cardiovascular negative cardio ROS Normal cardiovascular exam Rhythm:Regular Rate:Normal     Neuro/Psych negative neurological ROS  negative psych ROS   GI/Hepatic negative GI ROS, Neg liver ROS,   Endo/Other  Breast Ca  Renal/GU negative Renal ROS  negative genitourinary   Musculoskeletal negative musculoskeletal ROS (+)   Abdominal   Peds  Hematology negative hematology ROS (+)   Anesthesia Other Findings   Reproductive/Obstetrics                             Anesthesia Physical Anesthesia Plan  ASA: II  Anesthesia Plan: General   Post-op Pain Management:    Induction:   PONV Risk Score and Plan: 2 and Ondansetron, Treatment may vary due to age or medical condition, Dexamethasone, Midazolam and TIVA  Airway Management Planned: LMA  Additional Equipment:   Intra-op Plan:   Post-operative Plan: Extubation in OR  Informed Consent: I have reviewed the patients History and Physical, chart, labs and discussed the procedure including the risks, benefits and alternatives for the proposed anesthesia with the patient or authorized representative who has indicated his/her understanding and acceptance.     Dental advisory given  Plan Discussed with: CRNA and Surgeon  Anesthesia Plan Comments:        Anesthesia Quick Evaluation

## 2019-11-19 NOTE — Transfer of Care (Signed)
Immediate Anesthesia Transfer of Care Note  Patient: Hannah Wallace  Procedure(s) Performed: INSERTION PORT-A-CATH (Left Chest)  Patient Location: PACU  Anesthesia Type:General  Level of Consciousness: drowsy and patient cooperative  Airway & Oxygen Therapy: Patient Spontanous Breathing and Patient connected to face mask oxygen  Post-op Assessment: Report given to RN and Post -op Vital signs reviewed and stable  Post vital signs: Reviewed and stable  Last Vitals:  Vitals Value Taken Time  BP 112/65 11/19/19 1425  Temp    Pulse 94 11/19/19 1428  Resp 18 11/19/19 1428  SpO2 99 % 11/19/19 1428  Vitals shown include unvalidated device data.  Last Pain:  Vitals:   11/19/19 1205  TempSrc: Tympanic  PainSc: 0-No pain         Complications: No apparent anesthesia complications

## 2019-11-20 ENCOUNTER — Inpatient Hospital Stay: Payer: BC Managed Care – PPO

## 2019-11-20 ENCOUNTER — Encounter: Payer: Self-pay | Admitting: *Deleted

## 2019-11-20 ENCOUNTER — Encounter: Payer: Self-pay | Admitting: Medical Oncology

## 2019-11-20 ENCOUNTER — Inpatient Hospital Stay (HOSPITAL_BASED_OUTPATIENT_CLINIC_OR_DEPARTMENT_OTHER): Payer: BC Managed Care – PPO | Admitting: Hematology and Oncology

## 2019-11-20 ENCOUNTER — Other Ambulatory Visit: Payer: Self-pay

## 2019-11-20 VITALS — BP 126/63 | HR 69 | Temp 98.2°F | Resp 16

## 2019-11-20 DIAGNOSIS — C50412 Malignant neoplasm of upper-outer quadrant of left female breast: Secondary | ICD-10-CM

## 2019-11-20 DIAGNOSIS — Z5189 Encounter for other specified aftercare: Secondary | ICD-10-CM | POA: Diagnosis not present

## 2019-11-20 DIAGNOSIS — Z170 Estrogen receptor positive status [ER+]: Secondary | ICD-10-CM

## 2019-11-20 DIAGNOSIS — Z95828 Presence of other vascular implants and grafts: Secondary | ICD-10-CM | POA: Insufficient documentation

## 2019-11-20 DIAGNOSIS — Z5111 Encounter for antineoplastic chemotherapy: Secondary | ICD-10-CM | POA: Diagnosis present

## 2019-11-20 DIAGNOSIS — Z006 Encounter for examination for normal comparison and control in clinical research program: Secondary | ICD-10-CM | POA: Diagnosis present

## 2019-11-20 LAB — CBC WITH DIFFERENTIAL (CANCER CENTER ONLY)
Abs Immature Granulocytes: 0.03 10*3/uL (ref 0.00–0.07)
Basophils Absolute: 0 10*3/uL (ref 0.0–0.1)
Basophils Relative: 0 %
Eosinophils Absolute: 0 10*3/uL (ref 0.0–0.5)
Eosinophils Relative: 0 %
HCT: 40.4 % (ref 36.0–46.0)
Hemoglobin: 13.6 g/dL (ref 12.0–15.0)
Immature Granulocytes: 0 %
Lymphocytes Relative: 23 %
Lymphs Abs: 2.6 10*3/uL (ref 0.7–4.0)
MCH: 29.9 pg (ref 26.0–34.0)
MCHC: 33.7 g/dL (ref 30.0–36.0)
MCV: 88.8 fL (ref 80.0–100.0)
Monocytes Absolute: 0.8 10*3/uL (ref 0.1–1.0)
Monocytes Relative: 7 %
Neutro Abs: 7.7 10*3/uL (ref 1.7–7.7)
Neutrophils Relative %: 70 %
Platelet Count: 237 10*3/uL (ref 150–400)
RBC: 4.55 MIL/uL (ref 3.87–5.11)
RDW: 14.3 % (ref 11.5–15.5)
WBC Count: 11.2 10*3/uL — ABNORMAL HIGH (ref 4.0–10.5)
nRBC: 0 % (ref 0.0–0.2)

## 2019-11-20 LAB — CMP (CANCER CENTER ONLY)
ALT: 16 U/L (ref 0–44)
AST: 14 U/L — ABNORMAL LOW (ref 15–41)
Albumin: 4.2 g/dL (ref 3.5–5.0)
Alkaline Phosphatase: 76 U/L (ref 38–126)
Anion gap: 11 (ref 5–15)
BUN: 17 mg/dL (ref 8–23)
CO2: 23 mmol/L (ref 22–32)
Calcium: 9 mg/dL (ref 8.9–10.3)
Chloride: 106 mmol/L (ref 98–111)
Creatinine: 0.77 mg/dL (ref 0.44–1.00)
GFR, Est AFR Am: >60 mL/min (ref >60)
GFR, Estimated: >60 mL/min (ref >60)
Glucose, Bld: 84 mg/dL (ref 70–99)
Potassium: 3.9 mmol/L (ref 3.5–5.1)
Sodium: 140 mmol/L (ref 135–145)
Total Bilirubin: 0.4 mg/dL (ref 0.3–1.2)
Total Protein: 6.9 g/dL (ref 6.5–8.1)

## 2019-11-20 LAB — RESEARCH LABS

## 2019-11-20 MED ORDER — DEXAMETHASONE SODIUM PHOSPHATE 10 MG/ML IJ SOLN
INTRAMUSCULAR | Status: AC
  Filled 2019-11-20: qty 1

## 2019-11-20 MED ORDER — HEPARIN SOD (PORK) LOCK FLUSH 100 UNIT/ML IV SOLN
500.0000 [IU] | Freq: Once | INTRAVENOUS | Status: AC | PRN
  Administered 2019-11-20: 500 [IU]
  Filled 2019-11-20: qty 5

## 2019-11-20 MED ORDER — DEXAMETHASONE SODIUM PHOSPHATE 10 MG/ML IJ SOLN
10.0000 mg | Freq: Once | INTRAMUSCULAR | Status: AC
  Administered 2019-11-20: 10 mg via INTRAVENOUS

## 2019-11-20 MED ORDER — PALONOSETRON HCL INJECTION 0.25 MG/5ML
INTRAVENOUS | Status: AC
  Filled 2019-11-20: qty 5

## 2019-11-20 MED ORDER — PALONOSETRON HCL INJECTION 0.25 MG/5ML
0.2500 mg | Freq: Once | INTRAVENOUS | Status: AC
  Administered 2019-11-20: 0.25 mg via INTRAVENOUS

## 2019-11-20 MED ORDER — SODIUM CHLORIDE 0.9 % IV SOLN: 10.0000 mg | Freq: Once | INTRAVENOUS | Status: DC

## 2019-11-20 MED ORDER — SODIUM CHLORIDE 0.9 % IV SOLN
600.0000 mg/m2 | Freq: Once | INTRAVENOUS | Status: AC
  Administered 2019-11-20: 1260 mg via INTRAVENOUS
  Filled 2019-11-20: qty 63

## 2019-11-20 MED ORDER — SODIUM CHLORIDE 0.9 % IV SOLN
75.0000 mg/m2 | Freq: Once | INTRAVENOUS | Status: AC
  Administered 2019-11-20: 160 mg via INTRAVENOUS
  Filled 2019-11-20: qty 16

## 2019-11-20 MED ORDER — SODIUM CHLORIDE 0.9% FLUSH
10.0000 mL | INTRAVENOUS | Status: DC | PRN
  Administered 2019-11-20: 10 mL
  Filled 2019-11-20: qty 10

## 2019-11-20 MED ORDER — SODIUM CHLORIDE 0.9 % IV SOLN
Freq: Once | INTRAVENOUS | Status: AC
  Filled 2019-11-20: qty 250

## 2019-11-20 NOTE — Assessment & Plan Note (Addendum)
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 Adjuvant chemotherapy with Taxotere and Cytoxan every 3 weeks x 4 2.  Adjuvant radiation therapy followed by 3.  Adjuvant antiestrogen therapy ------------------------------------------------------------------------------------------------------------------------------------------------------------------- Current treatment: Cycle 1 day 1 Taxotere and Cytoxan Chemo consent obtained, chemo education completed Labs reviewed Port has been placed yesterday. Port related discomfort: I encouraged her that it should get better within a few days.  Return to clinic in 1 week for toxicity check

## 2019-11-20 NOTE — Addendum Note (Signed)
Addendum  created 11/20/19 1044 by Tawni Millers, CRNA   Charge Capture section accepted

## 2019-11-20 NOTE — Patient Instructions (Signed)
Cyclophosphamide Injection What is this medicine? CYCLOPHOSPHAMIDE (sye kloe FOSS fa mide) is a chemotherapy drug. It slows the growth of cancer cells. This medicine is used to treat many types of cancer like lymphoma, myeloma, leukemia, breast cancer, and ovarian cancer, to name a few. This medicine may be used for other purposes; ask your health care provider or pharmacist if you have questions. COMMON BRAND NAME(S): Cytoxan, Neosar What should I tell my health care provider before I take this medicine? They need to know if you have any of these conditions:  heart disease  history of irregular heartbeat  infection  kidney disease  liver disease  low blood counts, like white cells, platelets, or red blood cells  on hemodialysis  recent or ongoing radiation therapy  scarring or thickening of the lungs  trouble passing urine  an unusual or allergic reaction to cyclophosphamide, other medicines, foods, dyes, or preservatives  pregnant or trying to get pregnant  breast-feeding How should I use this medicine? This drug is usually given as an injection into a vein or muscle or by infusion into a vein. It is administered in a hospital or clinic by a specially trained health care professional. Talk to your pediatrician regarding the use of this medicine in children. Special care may be needed. Overdosage: If you think you have taken too much of this medicine contact a poison control center or emergency room at once. NOTE: This medicine is only for you. Do not share this medicine with others. What if I miss a dose? It is important not to miss your dose. Call your doctor or health care professional if you are unable to keep an appointment. What may interact with this medicine?  amphotericin B  azathioprine  certain antivirals for HIV or hepatitis  certain medicines for blood pressure, heart disease, irregular heart beat  certain medicines that treat or prevent blood clots  like warfarin  certain other medicines for cancer  cyclosporine  etanercept  indomethacin  medicines that relax muscles for surgery  medicines to increase blood counts  metronidazole This list may not describe all possible interactions. Give your health care provider a list of all the medicines, herbs, non-prescription drugs, or dietary supplements you use. Also tell them if you smoke, drink alcohol, or use illegal drugs. Some items may interact with your medicine. What should I watch for while using this medicine? Your condition will be monitored carefully while you are receiving this medicine. You may need blood work done while you are taking this medicine. Drink water or other fluids as directed. Urinate often, even at night. Some products may contain alcohol. Ask your health care professional if this medicine contains alcohol. Be sure to tell all health care professionals you are taking this medicine. Certain medicines, like metronidazole and disulfiram, can cause an unpleasant reaction when taken with alcohol. The reaction includes flushing, headache, nausea, vomiting, sweating, and increased thirst. The reaction can last from 30 minutes to several hours. Do not become pregnant while taking this medicine or for 1 year after stopping it. Women should inform their health care professional if they wish to become pregnant or think they might be pregnant. Men should not father a child while taking this medicine and for 4 months after stopping it. There is potential for serious side effects to an unborn child. Talk to your health care professional for more information. Do not breast-feed an infant while taking this medicine or for 1 week after stopping it. This medicine has  caused ovarian failure in some women. This medicine may make it more difficult to get pregnant. Talk to your health care professional if you are concerned about your fertility. This medicine has caused decreased sperm  counts in some men. This may make it more difficult to father a child. Talk to your health care professional if you are concerned about your fertility. Call your health care professional for advice if you get a fever, chills, or sore throat, or other symptoms of a cold or flu. Do not treat yourself. This medicine decreases your body's ability to fight infections. Try to avoid being around people who are sick. Avoid taking medicines that contain aspirin, acetaminophen, ibuprofen, naproxen, or ketoprofen unless instructed by your health care professional. These medicines may hide a fever. Talk to your health care professional about your risk of cancer. You may be more at risk for certain types of cancer if you take this medicine. If you are going to need surgery or other procedure, tell your health care professional that you are using this medicine. Be careful brushing or flossing your teeth or using a toothpick because you may get an infection or bleed more easily. If you have any dental work done, tell your dentist you are receiving this medicine. What side effects may I notice from receiving this medicine? Side effects that you should report to your doctor or health care professional as soon as possible:  allergic reactions like skin rash, itching or hives, swelling of the face, lips, or tongue  breathing problems  nausea, vomiting  signs and symptoms of bleeding such as bloody or black, tarry stools; red or dark brown urine; spitting up blood or brown material that looks like coffee grounds; red spots on the skin; unusual bruising or bleeding from the eyes, gums, or nose  signs and symptoms of heart failure like fast, irregular heartbeat, sudden weight gain; swelling of the ankles, feet, hands  signs and symptoms of infection like fever; chills; cough; sore throat; pain or trouble passing urine  signs and symptoms of kidney injury like trouble passing urine or change in the amount of  urine  signs and symptoms of liver injury like dark yellow or brown urine; general ill feeling or flu-like symptoms; light-colored stools; loss of appetite; nausea; right upper belly pain; unusually weak or tired; yellowing of the eyes or skin Side effects that usually do not require medical attention (report to your doctor or health care professional if they continue or are bothersome):  confusion  decreased hearing  diarrhea  facial flushing  hair loss  headache  loss of appetite  missed menstrual periods  signs and symptoms of low red blood cells or anemia such as unusually weak or tired; feeling faint or lightheaded; falls  skin discoloration This list may not describe all possible side effects. Call your doctor for medical advice about side effects. You may report side effects to FDA at 1-800-FDA-1088. Where should I keep my medicine? This drug is given in a hospital or clinic and will not be stored at home. NOTE: This sheet is a summary. It may not cover all possible information. If you have questions about this medicine, talk to your doctor, pharmacist, or health care provider.  2020 Elsevier/Gold Standard (2019-07-28 09:53:29) Docetaxel injection What is this medicine? DOCETAXEL (doe se TAX el) is a chemotherapy drug. It targets fast dividing cells, like cancer cells, and causes these cells to die. This medicine is used to treat many types of cancers like  breast cancer, certain stomach cancers, head and neck cancer, lung cancer, and prostate cancer. This medicine may be used for other purposes; ask your health care provider or pharmacist if you have questions. COMMON BRAND NAME(S): Docefrez, Taxotere What should I tell my health care provider before I take this medicine? They need to know if you have any of these conditions:  infection (especially a virus infection such as chickenpox, cold sores, or herpes)  liver disease  low blood counts, like low white cell,  platelet, or red cell counts  an unusual or allergic reaction to docetaxel, polysorbate 80, other chemotherapy agents, other medicines, foods, dyes, or preservatives  pregnant or trying to get pregnant  breast-feeding How should I use this medicine? This drug is given as an infusion into a vein. It is administered in a hospital or clinic by a specially trained health care professional. Talk to your pediatrician regarding the use of this medicine in children. Special care may be needed. Overdosage: If you think you have taken too much of this medicine contact a poison control center or emergency room at once. NOTE: This medicine is only for you. Do not share this medicine with others. What if I miss a dose? It is important not to miss your dose. Call your doctor or health care professional if you are unable to keep an appointment. What may interact with this medicine?  aprepitant  certain antibiotics like erythromycin or clarithromycin  certain antivirals for HIV or hepatitis  certain medicines for fungal infections like fluconazole, itraconazole, ketoconazole, posaconazole, or voriconazole  cimetidine  ciprofloxacin  conivaptan  cyclosporine  dronedarone  fluvoxamine  grapefruit juice  imatinib  verapamil This list may not describe all possible interactions. Give your health care provider a list of all the medicines, herbs, non-prescription drugs, or dietary supplements you use. Also tell them if you smoke, drink alcohol, or use illegal drugs. Some items may interact with your medicine. What should I watch for while using this medicine? Your condition will be monitored carefully while you are receiving this medicine. You will need important blood work done while you are taking this medicine. Call your doctor or health care professional for advice if you get a fever, chills or sore throat, or other symptoms of a cold or flu. Do not treat yourself. This drug decreases your  body's ability to fight infections. Try to avoid being around people who are sick. Some products may contain alcohol. Ask your health care professional if this medicine contains alcohol. Be sure to tell all health care professionals you are taking this medicine. Certain medicines, like metronidazole and disulfiram, can cause an unpleasant reaction when taken with alcohol. The reaction includes flushing, headache, nausea, vomiting, sweating, and increased thirst. The reaction can last from 30 minutes to several hours. You may get drowsy or dizzy. Do not drive, use machinery, or do anything that needs mental alertness until you know how this medicine affects you. Do not stand or sit up quickly, especially if you are an older patient. This reduces the risk of dizzy or fainting spells. Alcohol may interfere with the effect of this medicine. Talk to your health care professional about your risk of cancer. You may be more at risk for certain types of cancer if you take this medicine. Do not become pregnant while taking this medicine or for 6 months after stopping it. Women should inform their doctor if they wish to become pregnant or think they might be pregnant. There is a  potential for serious side effects to an unborn child. Talk to your health care professional or pharmacist for more information. Do not breast-feed an infant while taking this medicine or for 1 week after stopping it. Males who get this medicine must use a condom during sex with females who can get pregnant. If you get a woman pregnant, the baby could have birth defects. The baby could die before they are born. You will need to continue wearing a condom for 3 months after stopping the medicine. Tell your health care provider right away if your partner becomes pregnant while you are taking this medicine. This may interfere with the ability to father a child. You should talk to your doctor or health care professional if you are concerned about your  fertility. What side effects may I notice from receiving this medicine? Side effects that you should report to your doctor or health care professional as soon as possible:  allergic reactions like skin rash, itching or hives, swelling of the face, lips, or tongue  blurred vision  breathing problems  changes in vision  low blood counts - This drug may decrease the number of white blood cells, red blood cells and platelets. You may be at increased risk for infections and bleeding.  nausea and vomiting  pain, redness or irritation at site where injected  pain, tingling, numbness in the hands or feet  redness, blistering, peeling, or loosening of the skin, including inside the mouth  signs of decreased platelets or bleeding - bruising, pinpoint red spots on the skin, black, tarry stools, nosebleeds  signs of decreased red blood cells - unusually weak or tired, fainting spells, lightheadedness  signs of infection - fever or chills, cough, sore throat, pain or difficulty passing urine  swelling of the ankle, feet, hands Side effects that usually do not require medical attention (report to your doctor or health care professional if they continue or are bothersome):  constipation  diarrhea  fingernail or toenail changes  hair loss  loss of appetite  mouth sores  muscle pain This list may not describe all possible side effects. Call your doctor for medical advice about side effects. You may report side effects to FDA at 1-800-FDA-1088. Where should I keep my medicine? This drug is given in a hospital or clinic and will not be stored at home. NOTE: This sheet is a summary. It may not cover all possible information. If you have questions about this medicine, talk to your doctor, pharmacist, or health care provider.  2020 Elsevier/Gold Standard (2019-06-19 10:19:06)

## 2019-11-20 NOTE — Progress Notes (Signed)
Q9450, A PROSPECTIVE OBSERVATIONAL COHORT STUDY TO DEVELOP A PREDICTIVE MODEL OF TAXANE-INDUCED PERIPHERAL NEUROPATHY IN CANCER PATIENTS. Baseline and 1st Taxane visit:  Patient presented to the clinic, alone, for her first treatment. I met with patient after her labs and MD appointment.  Patient and I reviewed her medical history as well as her concomitant medications this morning. Patient denies having any neuropathy or pain. Patient confirms no history of smoking.   PROs: Questionnaires were given to patient to complete in clinic prior to registration. Collected questionnaires and checked for completeness and accuracy.  Registration: Patient meets all eligibility requirements to be enrolled on the [id number] study.  Eligibility also confirmed by research RN, Foye Spurling. Dr. Lindi Adie agrees patient meets eligiblity and patient was registered to the [phone] study via Open and assigned patient ID [id number]. Labs: Baseline mandatory research labs were collected, per protocol along, with other standard of care labs ordered by provider, including serum creatinine level. Physician Assessments: CTCAE and Treatment Burden forms completed and signed by Dr. Lindi Adie.  History of Falls: Patient denied any falls in the past 6 months.  Medical and Smoking History: Reviewed with patient and CRFs completed.  Assessment for Interventions for CIPN: Reviewed with patient and CRFs completed. Neuropen Assessment: Completed per protocol by certified research RN, Tyrell Antonio. T[place] Assessment: Completed per protocol by this Film/video editor.  Timed Get Up and Go Test: Completed per protocol by this trained research RN.  1st Taxane:  Mandatory research labs drawn during last 10 minutes of docetaxel infusion. Research nurse coordinated with RN, Royston Sinner and Lenox Ponds LPN, for this blood draw. Docetaxel infusion start time:  10:09 am Stop time: 11:40 am. Time of Blood Draw: 11:32 am by Lenox Ponds, LPN Plan:  Informed patient of next study assessments in approximately 4 weeks and will be done on same day as treatment.  Plan for this visit to be February 4th. Patient denied having any questions at this time. Patient thanked for her time and contribution to study and was encouraged to call clinic for any questions or concerns she may have prior to her next appointment.  Maxwell Marion, RN, BSN, [facility] Clinical Research 11/20/2019 11:51 AM

## 2019-11-21 ENCOUNTER — Telehealth: Payer: Self-pay | Admitting: *Deleted

## 2019-11-22 ENCOUNTER — Inpatient Hospital Stay: Payer: BC Managed Care – PPO

## 2019-11-22 VITALS — BP 122/69 | HR 82 | Temp 97.5°F | Resp 20

## 2019-11-22 DIAGNOSIS — C50412 Malignant neoplasm of upper-outer quadrant of left female breast: Secondary | ICD-10-CM | POA: Diagnosis not present

## 2019-11-22 MED ORDER — PEGFILGRASTIM-JMDB 6 MG/0.6ML ~~LOC~~ SOSY
6.0000 mg | PREFILLED_SYRINGE | Freq: Once | SUBCUTANEOUS | Status: AC
  Administered 2019-11-22: 6 mg via SUBCUTANEOUS

## 2019-11-26 ENCOUNTER — Encounter: Payer: Self-pay | Admitting: *Deleted

## 2019-11-26 NOTE — Progress Notes (Signed)
Patient Care Team: Joella Prince as PCP - General (Physician Assistant) Mauro Kaufmann, RN as Oncology Nurse Navigator Rockwell Germany, RN as Oncology Nurse Navigator  DIAGNOSIS:    ICD-10-CM   1. Malignant neoplasm of upper-outer quadrant of left breast in female, estrogen receptor positive ([place])  C50.412    Z17.0     SUMMARY OF ONCOLOGIC HISTORY: Oncology History  Malignant neoplasm of upper-outer quadrant of left breast in female, estrogen receptor positive ([place])  09/03/2019 Initial Diagnosis   Routine screening mammogram detected a 1.4cm indeterminate left breast mass. US showed a 1.0cm left breast mass and a 0.5cm lymph node with cortical thickening at the 2:00 position in the left breast. 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   09/10/2019 Cancer Staging   Staging form: Breast, AJCC 8th Edition - Clinical stage from 09/10/2019: Stage IA (cT1c, cN0, cM0, G2, ER+, PR+, HER2-) - Signed by Nicholas Lose, MD on 09/10/2019   09/25/2019 Surgery   Left lumpectomy [facility]): IDC, grade 2, 1.2cm, with intermediate grade DCIS, 5 axillary lymph nodes negative, clear margins.    09/25/2019 Cancer Staging   Staging form: Breast, AJCC 8th Edition - Pathologic stage from 09/25/2019: Stage IA (pT1c, pN0, cM0, G2, ER+, PR+, HER2-) - Signed by Gardenia Phlegm, NP on 10/15/2019   11/03/2019 Oncotype testing   Oncotype score 26   11/20/2019 -  Chemotherapy   The patient had palonosetron (ALOXI) injection 0.25 mg, 0.25 mg, Intravenous,  Once, 1 of 4 cycles Administration: 0.25 mg (11/20/2019) pegfilgrastim-jmdb (FULPHILA) injection 6 mg, 6 mg, Subcutaneous,  Once, 1 of 4 cycles Administration: 6 mg (11/22/2019) cyclophosphamide (CYTOXAN) 1,260 mg in sodium chloride 0.9 % 250 mL chemo infusion, 600 mg/m2 = 1,260 mg, Intravenous,  Once, 1 of 4 cycles Administration: 1,260 mg (11/20/2019) DOCEtaxel (TAXOTERE) 160  mg in sodium chloride 0.9 % 250 mL chemo infusion, 75 mg/m2 = 160 mg, Intravenous,  Once, 1 of 4 cycles Administration: 160 mg (11/20/2019)  for chemotherapy treatment.      CHIEF COMPLIANT: Cycle 1 Day 8 Taxotere and Cytoxan  INTERVAL HISTORY: Hannah Wallace is a 65 y.o. with above-mentioned history of left breast cancerwhounderwent a lumpectomy and is currently on neoadjuvant chemotherapy with Taxotere and Cytoxan. She presents to the clinic today for a toxicity check following cycle 1.   ALLERGIES:  has No Known Allergies.  MEDICATIONS:  Current Outpatient Medications  Medication Sig Dispense Refill  . b complex vitamins capsule Take 1 capsule by mouth daily.    Marland Kitchen dexamethasone (DECADRON) 4 MG tablet Take 1 tablet (4 mg total) by mouth daily. Take 1 tablet day after chemotherapy and 1 tablet 2 days after chemotherapy with food 8 tablet 0  . lidocaine-prilocaine (EMLA) cream Apply to affected area once 30 g 3  . LORazepam (ATIVAN) 0.5 MG tablet Take 1 tablet (0.5 mg total) by mouth at bedtime as needed for sleep. 30 tablet 0  . Multiple Vitamin (MULTIVITAMIN) tablet Take 1 tablet by mouth daily.    . Omega-3 Fatty Acids (FISH OIL) 1000 MG CAPS Take 1,000 mg by mouth 2 (two) times daily.    . ondansetron (ZOFRAN) 8 MG tablet Take 1 tablet (8 mg total) by mouth 2 (two) times daily as needed for refractory nausea / vomiting. Start on day 3 after chemo. 30 tablet 1  . Polyethyl Glycol-Propyl Glycol (SYSTANE) 0.4-0.3 % SOLN Place  1 drop into both eyes daily.    . prochlorperazine (COMPAZINE) 10 MG tablet Take 1 tablet (10 mg total) by mouth every 6 (six) hours as needed (Nausea or vomiting). 30 tablet 1  . UNABLE TO FIND [employer] Thyroid Complete    . vitamin C (ASCORBIC ACID) 500 MG tablet Take 500 mg by mouth daily.    . Vitamin D-Vitamin K (VITAMIN K2-VITAMIN D3 PO) Take 1 tablet by mouth daily. D3 125 mcg K2 90 mcg     No current facility-administered medications for this visit.     PHYSICAL EXAMINATION: ECOG PERFORMANCE STATUS: 1 - Symptomatic but completely ambulatory  Vitals:   11/27/19 1352  BP: 97/72  Pulse: (!) 120  Temp: 97.8 F (36.6 C)  SpO2: 99%   Filed Weights   11/27/19 1352  Weight: 191 lb 6.4 oz (86.8 kg)    LABORATORY DATA:  I have reviewed the data as listed CMP Latest Ref Rng & Units 11/20/2019 09/10/2019  Glucose 70 - 99 mg/dL 84 85  BUN 8 - 23 mg/dL 17 18  Creatinine 0.44 - 1.00 mg/dL 0.77 0.87  Sodium 135 - 145 mmol/L 140 140  Potassium 3.5 - 5.1 mmol/L 3.9 4.1  Chloride 98 - 111 mmol/L 106 104  CO2 22 - 32 mmol/L 23 25  Calcium 8.9 - 10.3 mg/dL 9.0 9.5  Total Protein 6.5 - 8.1 g/dL 6.9 7.6  Total Bilirubin 0.3 - 1.2 mg/dL 0.4 0.5  Alkaline Phos 38 - 126 U/L 76 69  AST 15 - 41 U/L 14(L) 19  ALT 0 - 44 U/L 16 47(H)    Lab Results  Component Value Date   WBC 13.8 (H) 11/27/2019   HGB 13.7 11/27/2019   HCT 39.9 11/27/2019   MCV 87.1 11/27/2019   PLT 164 11/27/2019   NEUTROABS PENDING 11/27/2019    ASSESSMENT & PLAN:  Malignant neoplasm of upper-outer quadrant of left breast in female, estrogen receptor positive ([place]) 09/25/2019:Left lumpectomy ([place]): IDC, grade 2, 1.2cm, with intermediate grade DCIS, 5 axillary lymph nodes negative, clear margins.HER-2 - by FISH, ER+ 90%, PR+ 100%, Ki67 2%, Oncotype DX score 26  Treatment plan: 1.Adjuvant chemotherapy with Taxotere and Cytoxan every 3 weeks x 4 2.Adjuvant radiation therapy followed by 3.Adjuvant antiestrogen therapy ------------------------------------------------------------------------------------------------------------------------------------------------------------------- Current treatment: Cycle 1 day 8 Taxotere and Cytoxan Chemo toxicities: 1. Bone pain right leg due to udenyca 2. Mild fatigue  Denies nausea or vomiting Next cycle we will not do oral dexamethasone  Return to clinic in 2 weeks for cycle 2    No orders of the defined types  were placed in this encounter.  The patient has a good understanding of the overall plan. she agrees with it. she will call with any problems that may develop before the next visit here.  Total time spent: 30 mins including face to face time and time spent for planning, charting and coordination of care  Nicholas Lose, MD 11/27/2019  I, Cloyde Reams Dorshimer, am acting as scribe for Dr. Nicholas Lose.  I have reviewed the above documentation for accuracy and completeness, and I agree with the above.

## 2019-11-27 ENCOUNTER — Inpatient Hospital Stay (HOSPITAL_BASED_OUTPATIENT_CLINIC_OR_DEPARTMENT_OTHER): Payer: BC Managed Care – PPO | Admitting: Hematology and Oncology

## 2019-11-27 ENCOUNTER — Other Ambulatory Visit: Payer: Self-pay

## 2019-11-27 ENCOUNTER — Inpatient Hospital Stay: Payer: BC Managed Care – PPO

## 2019-11-27 ENCOUNTER — Encounter: Payer: Self-pay | Admitting: Adult Health

## 2019-11-27 DIAGNOSIS — C50412 Malignant neoplasm of upper-outer quadrant of left female breast: Secondary | ICD-10-CM

## 2019-11-27 DIAGNOSIS — Z170 Estrogen receptor positive status [ER+]: Secondary | ICD-10-CM

## 2019-11-27 DIAGNOSIS — Z95828 Presence of other vascular implants and grafts: Secondary | ICD-10-CM

## 2019-11-27 LAB — CMP (CANCER CENTER ONLY)
ALT: 14 U/L (ref 0–44)
AST: 20 U/L (ref 15–41)
Albumin: 3.8 g/dL (ref 3.5–5.0)
Alkaline Phosphatase: 75 U/L (ref 38–126)
Anion gap: 12 (ref 5–15)
BUN: 22 mg/dL (ref 8–23)
CO2: 21 mmol/L — ABNORMAL LOW (ref 22–32)
Calcium: 10.2 mg/dL (ref 8.9–10.3)
Chloride: 105 mmol/L (ref 98–111)
Creatinine: 0.84 mg/dL (ref 0.44–1.00)
GFR, Est AFR Am: >60 mL/min (ref >60)
GFR, Estimated: >60 mL/min (ref >60)
Glucose, Bld: 90 mg/dL (ref 70–99)
Potassium: 4.1 mmol/L (ref 3.5–5.1)
Sodium: 138 mmol/L (ref 135–145)
Total Bilirubin: 0.3 mg/dL (ref 0.3–1.2)
Total Protein: 7.2 g/dL (ref 6.5–8.1)

## 2019-11-27 LAB — CBC WITH DIFFERENTIAL (CANCER CENTER ONLY)
Abs Immature Granulocytes: 2.06 10*3/uL — ABNORMAL HIGH (ref 0.00–0.07)
Basophils Absolute: 0.1 10*3/uL (ref 0.0–0.1)
Basophils Relative: 0 %
Eosinophils Absolute: 0.1 10*3/uL (ref 0.0–0.5)
Eosinophils Relative: 0 %
HCT: 39.9 % (ref 36.0–46.0)
Hemoglobin: 13.7 g/dL (ref 12.0–15.0)
Immature Granulocytes: 15 %
Lymphocytes Relative: 12 %
Lymphs Abs: 1.7 10*3/uL (ref 0.7–4.0)
MCH: 29.9 pg (ref 26.0–34.0)
MCHC: 34.3 g/dL (ref 30.0–36.0)
MCV: 87.1 fL (ref 80.0–100.0)
Monocytes Absolute: 4.3 10*3/uL — ABNORMAL HIGH (ref 0.1–1.0)
Monocytes Relative: 31 %
Neutro Abs: 5.7 10*3/uL (ref 1.7–7.7)
Neutrophils Relative %: 42 %
Platelet Count: 164 10*3/uL (ref 150–400)
RBC: 4.58 MIL/uL (ref 3.87–5.11)
RDW: 14 % (ref 11.5–15.5)
WBC Count: 13.8 10*3/uL — ABNORMAL HIGH (ref 4.0–10.5)
nRBC: 0.3 % — ABNORMAL HIGH (ref 0.0–0.2)

## 2019-11-27 MED ORDER — HEPARIN SOD (PORK) LOCK FLUSH 100 UNIT/ML IV SOLN
500.0000 [IU] | Freq: Once | INTRAVENOUS | Status: AC | PRN
  Administered 2019-11-27: 500 [IU]
  Filled 2019-11-27: qty 5

## 2019-11-27 MED ORDER — SODIUM CHLORIDE 0.9% FLUSH
10.0000 mL | INTRAVENOUS | Status: DC | PRN
  Administered 2019-11-27: 10 mL
  Filled 2019-11-27: qty 10

## 2019-11-27 NOTE — Progress Notes (Signed)
Met with patient at registration to introduce myself as Arboriculturist and to offer available resources.  Discussed one-time $1000 Radio broadcast assistant to assist with personal expenses while going through treatment.  Gave her my card if interested in applying and for any additional financial questions or concerns.  She had a questions regarding a billing refund. Provided her with the number to Patient Accounting at [phone].

## 2019-11-27 NOTE — Assessment & Plan Note (Signed)
09/25/2019:Left lumpectomy [facility]): IDC, grade 2, 1.2cm, with intermediate grade DCIS, 5 axillary lymph nodes negative, clear margins.HER-2 - by FISH, ER+ 90%, PR+ 100%, Ki67 2%, Oncotype DX score 26  Treatment plan: 1.Adjuvant chemotherapy with Taxotere and Cytoxan every 3 weeks x 4 2.Adjuvant radiation therapy followed by 3.Adjuvant antiestrogen therapy ------------------------------------------------------------------------------------------------------------------------------------------------------------------- Current treatment: Cycle 1 day 8 Taxotere and Cytoxan Chemo toxicities:  Return to clinic in 2 week for cycle 2

## 2019-11-27 NOTE — Progress Notes (Signed)
Also mentioned to patient available copay assistance for fulphila injection if she has not met ded/OOP. Patient states she has met everything but thanks anyway.

## 2019-11-28 ENCOUNTER — Encounter: Payer: Self-pay | Admitting: *Deleted

## 2019-12-10 ENCOUNTER — Telehealth: Payer: Self-pay | Admitting: Medical Oncology

## 2019-12-10 NOTE — Progress Notes (Signed)
Patient Care Team: Joella Prince as PCP - General (Physician Assistant) Mauro Kaufmann, RN as Oncology Nurse Navigator Rockwell Germany, RN as Oncology Nurse Navigator  DIAGNOSIS:    ICD-10-CM   1. Malignant neoplasm of upper-outer quadrant of left breast in female, estrogen receptor positive ([place])  C50.412    Z17.0     SUMMARY OF ONCOLOGIC HISTORY: Oncology History  Malignant neoplasm of upper-outer quadrant of left breast in female, estrogen receptor positive ([place])  09/03/2019 Initial Diagnosis   Routine screening mammogram detected a 1.4cm indeterminate left breast mass. US showed a 1.0cm left breast mass and a 0.5cm lymph node with cortical thickening at the 2:00 position in the left breast. 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   09/10/2019 Cancer Staging   Staging form: Breast, AJCC 8th Edition - Clinical stage from 09/10/2019: Stage IA (cT1c, cN0, cM0, G2, ER+, PR+, HER2-) - Signed by Nicholas Lose, MD on 09/10/2019   09/25/2019 Surgery   Left lumpectomy [facility]): IDC, grade 2, 1.2cm, with intermediate grade DCIS, 5 axillary lymph nodes negative, clear margins.    09/25/2019 Cancer Staging   Staging form: Breast, AJCC 8th Edition - Pathologic stage from 09/25/2019: Stage IA (pT1c, pN0, cM0, G2, ER+, PR+, HER2-) - Signed by Gardenia Phlegm, NP on 10/15/2019   11/03/2019 Oncotype testing   Oncotype score 26   11/20/2019 -  Chemotherapy   The patient had palonosetron (ALOXI) injection 0.25 mg, 0.25 mg, Intravenous,  Once, 1 of 4 cycles Administration: 0.25 mg (11/20/2019) pegfilgrastim-jmdb (FULPHILA) injection 6 mg, 6 mg, Subcutaneous,  Once, 1 of 4 cycles Administration: 6 mg (11/22/2019) cyclophosphamide (CYTOXAN) 1,260 mg in sodium chloride 0.9 % 250 mL chemo infusion, 600 mg/m2 = 1,260 mg, Intravenous,  Once, 1 of 4 cycles Administration: 1,260 mg (11/20/2019) DOCEtaxel (TAXOTERE) 160  mg in sodium chloride 0.9 % 250 mL chemo infusion, 75 mg/m2 = 160 mg, Intravenous,  Once, 1 of 4 cycles Administration: 160 mg (11/20/2019)  for chemotherapy treatment.      CHIEF COMPLIANT: Cycle 2 Taxotere and Cytoxan  INTERVAL HISTORY: Willodean Leven is a 64 y.o. with above-mentioned history of left breast cancerwhounderwent a lumpectomyand is currently on neoadjuvant chemotherapy with Taxotere and Cytoxan.She presents to the clinic todayfor cycle 2.  She denies any neuropathy symptoms.  Energy levels have regained in the past week.  ALLERGIES:  has No Known Allergies.  MEDICATIONS:  Current Outpatient Medications  Medication Sig Dispense Refill  . b complex vitamins capsule Take 1 capsule by mouth daily.    Marland Kitchen lidocaine-prilocaine (EMLA) cream Apply to affected area once 30 g 3  . Multiple Vitamin (MULTIVITAMIN) tablet Take 1 tablet by mouth daily.    . Omega-3 Fatty Acids (FISH OIL) 1000 MG CAPS Take 1,000 mg by mouth 2 (two) times daily.    . ondansetron (ZOFRAN) 8 MG tablet Take 1 tablet (8 mg total) by mouth 2 (two) times daily as needed for refractory nausea / vomiting. Start on day 3 after chemo. 30 tablet 1  . Polyethyl Glycol-Propyl Glycol (SYSTANE) 0.4-0.3 % SOLN Place 1 drop into both eyes daily.    . prochlorperazine (COMPAZINE) 10 MG tablet Take 1 tablet (10 mg total) by mouth every 6 (six) hours as needed (Nausea or vomiting). 30 tablet 1  . UNABLE TO FIND [employer] Thyroid Complete    . vitamin C (ASCORBIC ACID) 500  MG tablet Take 500 mg by mouth daily.    . Vitamin D-Vitamin K (VITAMIN K2-VITAMIN D3 PO) Take 1 tablet by mouth daily. D3 125 mcg K2 90 mcg     No current facility-administered medications for this visit.    PHYSICAL EXAMINATION: ECOG PERFORMANCE STATUS: 1 - Symptomatic but completely ambulatory  Vitals:   12/11/19 1125  BP: 132/74  Pulse: 80  Resp: 19  Temp: 98 F (36.7 C)  SpO2: 98%   Filed Weights   12/11/19 1125  Weight: 191 lb 8 oz  (86.9 kg)    LABORATORY DATA:  I have reviewed the data as listed CMP Latest Ref Rng & Units 11/27/2019 11/20/2019 09/10/2019  Glucose 70 - 99 mg/dL 90 84 85  BUN 8 - 23 mg/dL '22 17 18  '$ Creatinine 0.44 - 1.00 mg/dL 0.84 0.77 0.87  Sodium 135 - 145 mmol/L 138 140 140  Potassium 3.5 - 5.1 mmol/L 4.1 3.9 4.1  Chloride 98 - 111 mmol/L 105 106 104  CO2 22 - 32 mmol/L 21(L) 23 25  Calcium 8.9 - 10.3 mg/dL 10.2 9.0 9.5  Total Protein 6.5 - 8.1 g/dL 7.2 6.9 7.6  Total Bilirubin 0.3 - 1.2 mg/dL 0.3 0.4 0.5  Alkaline Phos 38 - 126 U/L 75 76 69  AST 15 - 41 U/L 20 14(L) 19  ALT 0 - 44 U/L 14 16 47(H)    Lab Results  Component Value Date   WBC 10.7 (H) 12/11/2019   HGB 12.1 12/11/2019   HCT 36.3 12/11/2019   MCV 89.6 12/11/2019   PLT 472 (H) 12/11/2019   NEUTROABS 7.8 (H) 12/11/2019    ASSESSMENT & PLAN:  Malignant neoplasm of upper-outer quadrant of left breast in female, estrogen receptor positive ([place]) 09/25/2019:Left lumpectomy ([place]): IDC, grade 2, 1.2cm, with intermediate grade DCIS, 5 axillary lymph nodes negative, clear margins.HER-2 - by FISH, ER+ 90%, PR+ 100%, Ki67 2%, Oncotype DX score 26  Treatment plan: 1.Adjuvant chemotherapy with Taxotere and Cytoxan every 3 weeks x 4 2.Adjuvant radiation therapy followed by 3.Adjuvant antiestrogen therapy ------------------------------------------------------------------------------------------------------------------------------------------------------------------- Current treatment: Cycle 2 Taxotere and Cytoxan Chemo toxicities: 1.  Bone pain: Due to Udenyca.  She will take Tylenol for prevention. 2.  Fatigue: Stable this past week she is done extremely well. Denies any nausea or vomiting.  Denies nausea or vomiting We discontinued dexamethasone oral.  Return to clinic in 3 weeks for cycle 3    No orders of the defined types were placed in this encounter.  The patient has a good understanding of the overall  plan. she agrees with it. she will call with any problems that may develop before the next visit here.  Total time spent: 30 mins including face to face time and time spent for planning, charting and coordination of care  Rulon Eisenmenger, MD, MPH 12/11/2019  I, Cloyde Reams Dorshimer, am acting as scribe for Dr. Nicholas Lose.  I have reviewed the above documentation for accuracy and completeness, and I agree with the above.

## 2019-12-10 NOTE — Telephone Encounter (Signed)
[place] with patient reminding her that at her appointment tomorrow I will be collecting the 4 week study assessments. Patient informed of what assessments would be collected as well as that there are questionnaires for her to complete. Patient thanked and encouraged to call with questions in the meantime. Call back number provided.  Maxwell Marion, RN, BSN, [facility] Clinical Research 12/10/2019 2:51 PM

## 2019-12-11 ENCOUNTER — Inpatient Hospital Stay: Payer: BC Managed Care – PPO

## 2019-12-11 ENCOUNTER — Inpatient Hospital Stay (HOSPITAL_BASED_OUTPATIENT_CLINIC_OR_DEPARTMENT_OTHER): Payer: BC Managed Care – PPO | Admitting: Hematology and Oncology

## 2019-12-11 ENCOUNTER — Other Ambulatory Visit: Payer: Self-pay

## 2019-12-11 ENCOUNTER — Inpatient Hospital Stay: Payer: BC Managed Care – PPO | Attending: Hematology and Oncology

## 2019-12-11 ENCOUNTER — Encounter: Payer: Self-pay | Admitting: Medical Oncology

## 2019-12-11 DIAGNOSIS — C50412 Malignant neoplasm of upper-outer quadrant of left female breast: Secondary | ICD-10-CM

## 2019-12-11 DIAGNOSIS — Z170 Estrogen receptor positive status [ER+]: Secondary | ICD-10-CM | POA: Diagnosis not present

## 2019-12-11 DIAGNOSIS — Z006 Encounter for examination for normal comparison and control in clinical research program: Secondary | ICD-10-CM | POA: Insufficient documentation

## 2019-12-11 DIAGNOSIS — Z5111 Encounter for antineoplastic chemotherapy: Secondary | ICD-10-CM | POA: Diagnosis present

## 2019-12-11 DIAGNOSIS — Z95828 Presence of other vascular implants and grafts: Secondary | ICD-10-CM

## 2019-12-11 LAB — CBC WITH DIFFERENTIAL (CANCER CENTER ONLY)
Abs Immature Granulocytes: 0.06 10*3/uL (ref 0.00–0.07)
Basophils Absolute: 0.2 10*3/uL — ABNORMAL HIGH (ref 0.0–0.1)
Basophils Relative: 2 %
Eosinophils Absolute: 0 10*3/uL (ref 0.0–0.5)
Eosinophils Relative: 0 %
HCT: 36.3 % (ref 36.0–46.0)
Hemoglobin: 12.1 g/dL (ref 12.0–15.0)
Immature Granulocytes: 1 %
Lymphocytes Relative: 17 %
Lymphs Abs: 1.8 10*3/uL (ref 0.7–4.0)
MCH: 29.9 pg (ref 26.0–34.0)
MCHC: 33.3 g/dL (ref 30.0–36.0)
MCV: 89.6 fL (ref 80.0–100.0)
Monocytes Absolute: 0.9 10*3/uL (ref 0.1–1.0)
Monocytes Relative: 8 %
Neutro Abs: 7.8 10*3/uL — ABNORMAL HIGH (ref 1.7–7.7)
Neutrophils Relative %: 72 %
Platelet Count: 472 10*3/uL — ABNORMAL HIGH (ref 150–400)
RBC: 4.05 MIL/uL (ref 3.87–5.11)
RDW: 14.6 % (ref 11.5–15.5)
WBC Count: 10.7 10*3/uL — ABNORMAL HIGH (ref 4.0–10.5)
nRBC: 0 % (ref 0.0–0.2)

## 2019-12-11 LAB — CMP (CANCER CENTER ONLY)
ALT: 18 U/L (ref 0–44)
AST: 16 U/L (ref 15–41)
Albumin: 3.8 g/dL (ref 3.5–5.0)
Alkaline Phosphatase: 59 U/L (ref 38–126)
Anion gap: 11 (ref 5–15)
BUN: 23 mg/dL (ref 8–23)
CO2: 24 mmol/L (ref 22–32)
Calcium: 9.1 mg/dL (ref 8.9–10.3)
Chloride: 103 mmol/L (ref 98–111)
Creatinine: 0.94 mg/dL (ref 0.44–1.00)
GFR, Est AFR Am: >60 mL/min (ref >60)
GFR, Estimated: >60 mL/min (ref >60)
Glucose, Bld: 81 mg/dL (ref 70–99)
Potassium: 3.8 mmol/L (ref 3.5–5.1)
Sodium: 138 mmol/L (ref 135–145)
Total Bilirubin: 1 mg/dL (ref 0.3–1.2)
Total Protein: 7 g/dL (ref 6.5–8.1)

## 2019-12-11 MED ORDER — SODIUM CHLORIDE 0.9% FLUSH
10.0000 mL | INTRAVENOUS | Status: DC | PRN
  Administered 2019-12-11: 10 mL
  Filled 2019-12-11: qty 10

## 2019-12-11 MED ORDER — PALONOSETRON HCL INJECTION 0.25 MG/5ML
INTRAVENOUS | Status: AC
  Filled 2019-12-11: qty 5

## 2019-12-11 MED ORDER — DEXAMETHASONE SODIUM PHOSPHATE 10 MG/ML IJ SOLN
INTRAMUSCULAR | Status: AC
  Filled 2019-12-11: qty 1

## 2019-12-11 MED ORDER — SODIUM CHLORIDE 0.9 % IV SOLN
75.0000 mg/m2 | Freq: Once | INTRAVENOUS | Status: AC
  Administered 2019-12-11: 13:00:00 160 mg via INTRAVENOUS
  Filled 2019-12-11: qty 16

## 2019-12-11 MED ORDER — SODIUM CHLORIDE 0.9 % IV SOLN
600.0000 mg/m2 | Freq: Once | INTRAVENOUS | Status: AC
  Administered 2019-12-11: 14:00:00 1260 mg via INTRAVENOUS
  Filled 2019-12-11: qty 63

## 2019-12-11 MED ORDER — HEPARIN SOD (PORK) LOCK FLUSH 100 UNIT/ML IV SOLN
500.0000 [IU] | Freq: Once | INTRAVENOUS | Status: AC | PRN
  Administered 2019-12-11: 500 [IU]
  Filled 2019-12-11: qty 5

## 2019-12-11 MED ORDER — SODIUM CHLORIDE 0.9% FLUSH
10.0000 mL | INTRAVENOUS | Status: DC | PRN
  Administered 2019-12-11: 15:00:00 10 mL
  Filled 2019-12-11: qty 10

## 2019-12-11 MED ORDER — PALONOSETRON HCL INJECTION 0.25 MG/5ML
0.2500 mg | Freq: Once | INTRAVENOUS | Status: AC
  Administered 2019-12-11: 12:00:00 0.25 mg via INTRAVENOUS

## 2019-12-11 MED ORDER — SODIUM CHLORIDE 0.9 % IV SOLN
Freq: Once | INTRAVENOUS | Status: AC
  Filled 2019-12-11: qty 250

## 2019-12-11 MED ORDER — DEXAMETHASONE SODIUM PHOSPHATE 10 MG/ML IJ SOLN
10.0000 mg | Freq: Once | INTRAMUSCULAR | Status: AC
  Administered 2019-12-11: 12:00:00 10 mg via INTRAVENOUS

## 2019-12-11 NOTE — Patient Instructions (Signed)

## 2019-12-11 NOTE — Patient Instructions (Signed)
[facility] Discharge Instructions for Patients Receiving Chemotherapy  Today you received the following chemotherapy agents: Taxotere, Cytoxan  To help prevent nausea and vomiting after your treatment, we encourage you to take your nausea medication as directed.   If you develop nausea and vomiting that is not controlled by your nausea medication, call the clinic.   BELOW ARE SYMPTOMS THAT SHOULD BE REPORTED IMMEDIATELY:  *FEVER GREATER THAN 100.5 F  *CHILLS WITH OR WITHOUT FEVER  NAUSEA AND VOMITING THAT IS NOT CONTROLLED WITH YOUR NAUSEA MEDICATION  *UNUSUAL SHORTNESS OF BREATH  *UNUSUAL BRUISING OR BLEEDING  TENDERNESS IN MOUTH AND THROAT WITH OR WITHOUT PRESENCE OF ULCERS  *URINARY PROBLEMS  *BOWEL PROBLEMS  UNUSUAL RASH Items with * indicate a potential emergency and should be followed up as soon as possible.  Feel free to call the clinic should you have any questions or concerns. The clinic phone number is [phone].  Please show the CHEMO ALERT CARD at check-in to the Emergency Department and triage nurse.   

## 2019-12-11 NOTE — Progress Notes (Signed)
M2500, A PROSPECTIVE OBSERVATIONAL COHORT STUDY TO DEVELOP A PREDICTIVE MODEL OF TAXANE-INDUCED PERIPHERAL NEUROPATHY IN CANCER PATIENTS. 4 weeks Visit. Patient presented to the clinic, alone, for today's treatment. I met with patient after her lab appointment to complete her assessments.   PROs: Questionnaires were given to patient to complete in clinic, prior to her appointments. Collected questionnaires and checked for completeness and accuracy.  Labs: Patient did not consent to the optional labs. Physician Assessments: CTCAE and Treatment Burden forms completed and signed by Dr. Lindi Adie. All patient's questions/concerns addressed by MD.  Treatment: Patient to receive 2nd Docetaxel treatment today. No treatment delays or modifications have occurred.  History of Falls: assessment not required at this time point.    Solicited Neuropathy Events: patient declines having any issues.  Assessment for Interventions for CIPN: Reviewed with patient and CRFs completed. Patient reports she is using Urea Cream 40 for her hands and feet to help minimize any possible neuropathy issues. Patient confirms no S&S of neuropathy.  Neuropen Assessment: Completed per protocol by this certified research nurse. [place] Assessment: Completed per protocol by this Film/video editor, time and documentation completed by research nurse, Foye Spurling. Timed Get Up and Go Test: Not required at this visit. Plan: Informed patient of next study assessments in approximately 4 weeks and will be done on same day as treatment.  Plan for this visit to be March 18th. Patient denied having any questions at this time. Patient thanked for her time and contribution to study and was encouraged to call clinic for any questions or concerns she may have prior to her next appointment.  Maxwell Marion, RN, BSN, [facility] Clinical Research 12/11/2019 1:55 PM

## 2019-12-11 NOTE — Research (Signed)
DCP-001 Consent- Use of a Clinical Trial Screening Tool to Address Cancer Health Disparities in the [facility]) I met with patient this morning, who is here alone in clinic for scheduled appointments. Patient was provided study consent form for her review on January 8th, 2021. I spoke with patient about the study, this morning, and inquired as to wether she had a chance to review the consent and authorization forms and if she had any questions. Patient confirmed that she had reviewed the consent and authorization forms and after all her questions were answered to her satisfaction today, patient agreed to participate. The consent and authorization forms were reviewed with patient today as well. The consent and authorization forms were signed and dated by the patient and copies of both documents were provided to the patient for her records. Patient was thanked for her time and contribution to the study and was encouraged to call with any questions or concerns she may have. Patient has my contact information. Patient meets eligibility and will be enrolled in the DCP-001 study.  Maxwell Marion, RN, BSN, [facility] Clinical Research 12/11/2019 12:48 PM

## 2019-12-11 NOTE — Assessment & Plan Note (Signed)
09/25/2019:Left lumpectomy [facility]): IDC, grade 2, 1.2cm, with intermediate grade DCIS, 5 axillary lymph nodes negative, clear margins.HER-2 - by FISH, ER+ 90%, PR+ 100%, Ki67 2%, Oncotype DX score 26  Treatment plan: 1.Adjuvant chemotherapy with Taxotere and Cytoxan every 3 weeks x 4 2.Adjuvant radiation therapy followed by 3.Adjuvant antiestrogen therapy ------------------------------------------------------------------------------------------------------------------------------------------------------------------- Current treatment: Cycle 2 Taxotere and Cytoxan Chemo toxicities: 1.  Bone pain: Due to Udenyca 2.  Fatigue: Stable  Denies nausea or vomiting We discontinued dexamethasone oral.  Return to clinic in 3 weeks for cycle 3

## 2019-12-13 ENCOUNTER — Inpatient Hospital Stay: Payer: BC Managed Care – PPO

## 2019-12-13 ENCOUNTER — Other Ambulatory Visit: Payer: Self-pay

## 2019-12-13 VITALS — BP 108/59 | HR 72 | Temp 98.9°F | Resp 18

## 2019-12-13 DIAGNOSIS — C50412 Malignant neoplasm of upper-outer quadrant of left female breast: Secondary | ICD-10-CM | POA: Diagnosis not present

## 2019-12-13 MED ORDER — PEGFILGRASTIM-JMDB 6 MG/0.6ML ~~LOC~~ SOSY
6.0000 mg | PREFILLED_SYRINGE | Freq: Once | SUBCUTANEOUS | Status: AC
  Administered 2019-12-13: 6 mg via SUBCUTANEOUS

## 2019-12-13 NOTE — Patient Instructions (Signed)

## 2019-12-31 ENCOUNTER — Encounter: Payer: Self-pay | Admitting: *Deleted

## 2019-12-31 NOTE — Progress Notes (Signed)
Patient Care Team: Joella Prince as PCP - General (Physician Assistant) Mauro Kaufmann, RN as Oncology Nurse Navigator Rockwell Germany, RN as Oncology Nurse Navigator  DIAGNOSIS:    ICD-10-CM   1. Malignant neoplasm of upper-outer quadrant of left breast in female, estrogen receptor positive ([place])  C50.412    Z17.0     SUMMARY OF ONCOLOGIC HISTORY: Oncology History  Malignant neoplasm of upper-outer quadrant of left breast in female, estrogen receptor positive ([place])  09/03/2019 Initial Diagnosis   Routine screening mammogram detected a 1.4cm indeterminate left breast mass. US showed a 1.0cm left breast mass and a 0.5cm lymph node with cortical thickening at the 2:00 position in the left breast. 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   09/10/2019 Cancer Staging   Staging form: Breast, AJCC 8th Edition - Clinical stage from 09/10/2019: Stage IA (cT1c, cN0, cM0, G2, ER+, PR+, HER2-) - Signed by Nicholas Lose, MD on 09/10/2019   09/25/2019 Surgery   Left lumpectomy [facility]): IDC, grade 2, 1.2cm, with intermediate grade DCIS, 5 axillary lymph nodes negative, clear margins.    09/25/2019 Cancer Staging   Staging form: Breast, AJCC 8th Edition - Pathologic stage from 09/25/2019: Stage IA (pT1c, pN0, cM0, G2, ER+, PR+, HER2-) - Signed by Gardenia Phlegm, NP on 10/15/2019   11/03/2019 Oncotype testing   Oncotype score 26   11/20/2019 -  Chemotherapy   The patient had palonosetron (ALOXI) injection 0.25 mg, 0.25 mg, Intravenous,  Once, 2 of 4 cycles Administration: 0.25 mg (11/20/2019), 0.25 mg (12/11/2019) pegfilgrastim-jmdb (FULPHILA) injection 6 mg, 6 mg, Subcutaneous,  Once, 2 of 4 cycles Administration: 6 mg (11/22/2019), 6 mg (12/13/2019) cyclophosphamide (CYTOXAN) 1,260 mg in sodium chloride 0.9 % 250 mL chemo infusion, 600 mg/m2 = 1,260 mg, Intravenous,  Once, 2 of 4 cycles Administration: 1,260 mg  (11/20/2019), 1,260 mg (12/11/2019) DOCEtaxel (TAXOTERE) 160 mg in sodium chloride 0.9 % 250 mL chemo infusion, 75 mg/m2 = 160 mg, Intravenous,  Once, 2 of 4 cycles Administration: 160 mg (11/20/2019), 160 mg (12/11/2019)  for chemotherapy treatment.      CHIEF COMPLIANT: Cycle 3Taxotere and Cytoxan  INTERVAL HISTORY: Hannah Wallace is a 64 y.o. with above-mentioned history of left breast cancerwhounderwent a lumpectomyand is currently on neoadjuvant chemotherapy with Taxotere and Cytoxan.She presents to the clinic todayforcycle 3.    She tolerated last cycle extremely well.  She lost most of the hair but she still has a little bit of hair that she shaved it completely.  Denies any nausea or vomiting.  Denies diarrhea.  She does have occasional constipation.  She does have fatigue for a few days.  Bone pain is excellent control with Tylenol as needed.  ALLERGIES:  has No Known Allergies.  MEDICATIONS:  Current Outpatient Medications  Medication Sig Dispense Refill  . lidocaine-prilocaine (EMLA) cream Apply to affected area once 30 g 3  . Omega-3 Fatty Acids (FISH OIL) 1000 MG CAPS Take 1,000 mg by mouth 2 (two) times daily.    . ondansetron (ZOFRAN) 8 MG tablet Take 1 tablet (8 mg total) by mouth 2 (two) times daily as needed for refractory nausea / vomiting. Start on day 3 after chemo. 30 tablet 1  . Polyethyl Glycol-Propyl Glycol (SYSTANE) 0.4-0.3 % SOLN Place 1 drop into both eyes daily.    . prochlorperazine (COMPAZINE) 10 MG tablet Take 1 tablet (10 mg total) by  mouth every 6 (six) hours as needed (Nausea or vomiting). 30 tablet 1  . UNABLE TO FIND [employer] Thyroid Complete    . Vitamin D-Vitamin K (VITAMIN K2-VITAMIN D3 PO) Take 1 tablet by mouth daily. D3 125 mcg K2 90 mcg    . vitamin E 180 MG (400 UNITS) capsule Take 400 Units by mouth daily.     No current facility-administered medications for this visit.    PHYSICAL EXAMINATION: ECOG PERFORMANCE STATUS: 1 - Symptomatic  but completely ambulatory  Vitals:   01/01/20 1113  BP: 106/63  Pulse: 97  Resp: 17  Temp: 98.3 F (36.8 C)  SpO2: 98%   Filed Weights   01/01/20 1113  Weight: 192 lb 4.8 oz (87.2 kg)    LABORATORY DATA:  I have reviewed the data as listed CMP Latest Ref Rng & Units 12/11/2019 11/27/2019 11/20/2019  Glucose 70 - 99 mg/dL 81 90 84  BUN 8 - 23 mg/dL '23 22 17  '$ Creatinine 0.44 - 1.00 mg/dL 0.94 0.84 0.77  Sodium 135 - 145 mmol/L 138 138 140  Potassium 3.5 - 5.1 mmol/L 3.8 4.1 3.9  Chloride 98 - 111 mmol/L 103 105 106  CO2 22 - 32 mmol/L 24 21(L) 23  Calcium 8.9 - 10.3 mg/dL 9.1 10.2 9.0  Total Protein 6.5 - 8.1 g/dL 7.0 7.2 6.9  Total Bilirubin 0.3 - 1.2 mg/dL 1.0 0.3 0.4  Alkaline Phos 38 - 126 U/L 59 75 76  AST 15 - 41 U/L 16 20 14(L)  ALT 0 - 44 U/L '18 14 16    '$ Lab Results  Component Value Date   WBC 12.6 (H) 01/01/2020   HGB 12.3 01/01/2020   HCT 37.3 01/01/2020   MCV 89.9 01/01/2020   PLT 358 01/01/2020   NEUTROABS 9.7 (H) 01/01/2020    ASSESSMENT & PLAN:  Malignant neoplasm of upper-outer quadrant of left breast in female, estrogen receptor positive ([place]) 09/25/2019:Left lumpectomy ([place]): IDC, grade 2, 1.2cm, with intermediate grade DCIS, 5 axillary lymph nodes negative, clear margins.HER-2 - by FISH, ER+ 90%, PR+ 100%, Ki67 2%, Oncotype DX score 26  Treatment plan: 1.Adjuvant chemotherapy with Taxotere and Cytoxan every 3 weeks x 4 2.Adjuvant radiation therapy followed by 3.Adjuvant antiestrogen therapy ------------------------------------------------------------------------------------------------------------------------------------------------------------------- Current treatment: Cycle 3Taxotere and Cytoxan Chemo toxicities: 1.  Bone pain: Due to Udenyca.  She will take Tylenol for prevention. 2.  Fatigue: Stable this past week she is done extremely well. 3.  Sore in the nose 4.  Mild constipation  Denies any nausea or vomiting.  We  discontinued dexamethasone oral.  Return to clinic in3weeksfor cycle 4    No orders of the defined types were placed in this encounter.  The patient has a good understanding of the overall plan. she agrees with it. she will call with any problems that may develop before the next visit here.  Total time spent: 30 mins including face to face time and time spent for planning, charting and coordination of care  Nicholas Lose, MD 01/01/2020  I, Cloyde Reams Dorshimer, am acting as scribe for Dr. Nicholas Lose.  I have reviewed the above documentation for accuracy and completeness, and I agree with the above.

## 2020-01-01 ENCOUNTER — Inpatient Hospital Stay: Payer: BC Managed Care – PPO

## 2020-01-01 ENCOUNTER — Other Ambulatory Visit: Payer: Self-pay

## 2020-01-01 ENCOUNTER — Inpatient Hospital Stay (HOSPITAL_BASED_OUTPATIENT_CLINIC_OR_DEPARTMENT_OTHER): Payer: BC Managed Care – PPO | Admitting: Hematology and Oncology

## 2020-01-01 DIAGNOSIS — Z170 Estrogen receptor positive status [ER+]: Secondary | ICD-10-CM

## 2020-01-01 DIAGNOSIS — C50412 Malignant neoplasm of upper-outer quadrant of left female breast: Secondary | ICD-10-CM

## 2020-01-01 DIAGNOSIS — Z95828 Presence of other vascular implants and grafts: Secondary | ICD-10-CM

## 2020-01-01 LAB — CMP (CANCER CENTER ONLY)
ALT: 13 U/L (ref 0–44)
AST: 16 U/L (ref 15–41)
Albumin: 3.7 g/dL (ref 3.5–5.0)
Alkaline Phosphatase: 86 U/L (ref 38–126)
Anion gap: 9 (ref 5–15)
BUN: 21 mg/dL (ref 8–23)
CO2: 24 mmol/L (ref 22–32)
Calcium: 9.5 mg/dL (ref 8.9–10.3)
Chloride: 106 mmol/L (ref 98–111)
Creatinine: 0.78 mg/dL (ref 0.44–1.00)
GFR, Est AFR Am: >60 mL/min (ref >60)
GFR, Estimated: >60 mL/min (ref >60)
Glucose, Bld: 90 mg/dL (ref 70–99)
Potassium: 4.3 mmol/L (ref 3.5–5.1)
Sodium: 139 mmol/L (ref 135–145)
Total Bilirubin: 0.4 mg/dL (ref 0.3–1.2)
Total Protein: 7.2 g/dL (ref 6.5–8.1)

## 2020-01-01 LAB — CBC WITH DIFFERENTIAL (CANCER CENTER ONLY)
Abs Immature Granulocytes: 0.06 10*3/uL (ref 0.00–0.07)
Basophils Absolute: 0.2 10*3/uL — ABNORMAL HIGH (ref 0.0–0.1)
Basophils Relative: 1 %
Eosinophils Absolute: 0.1 10*3/uL (ref 0.0–0.5)
Eosinophils Relative: 1 %
HCT: 37.3 % (ref 36.0–46.0)
Hemoglobin: 12.3 g/dL (ref 12.0–15.0)
Immature Granulocytes: 1 %
Lymphocytes Relative: 11 %
Lymphs Abs: 1.4 10*3/uL (ref 0.7–4.0)
MCH: 29.6 pg (ref 26.0–34.0)
MCHC: 33 g/dL (ref 30.0–36.0)
MCV: 89.9 fL (ref 80.0–100.0)
Monocytes Absolute: 1.2 10*3/uL — ABNORMAL HIGH (ref 0.1–1.0)
Monocytes Relative: 9 %
Neutro Abs: 9.7 10*3/uL — ABNORMAL HIGH (ref 1.7–7.7)
Neutrophils Relative %: 77 %
Platelet Count: 358 10*3/uL (ref 150–400)
RBC: 4.15 MIL/uL (ref 3.87–5.11)
RDW: 15.6 % — ABNORMAL HIGH (ref 11.5–15.5)
WBC Count: 12.6 10*3/uL — ABNORMAL HIGH (ref 4.0–10.5)
nRBC: 0 % (ref 0.0–0.2)

## 2020-01-01 MED ORDER — DEXAMETHASONE SODIUM PHOSPHATE 10 MG/ML IJ SOLN
10.0000 mg | Freq: Once | INTRAMUSCULAR | Status: AC
  Administered 2020-01-01: 10 mg via INTRAVENOUS

## 2020-01-01 MED ORDER — PALONOSETRON HCL INJECTION 0.25 MG/5ML
INTRAVENOUS | Status: AC
  Filled 2020-01-01: qty 5

## 2020-01-01 MED ORDER — SODIUM CHLORIDE 0.9% FLUSH
10.0000 mL | INTRAVENOUS | Status: DC | PRN
  Administered 2020-01-01: 10 mL
  Filled 2020-01-01: qty 10

## 2020-01-01 MED ORDER — DEXAMETHASONE SODIUM PHOSPHATE 10 MG/ML IJ SOLN
INTRAMUSCULAR | Status: AC
  Filled 2020-01-01: qty 1

## 2020-01-01 MED ORDER — SODIUM CHLORIDE 0.9 % IV SOLN
600.0000 mg/m2 | Freq: Once | INTRAVENOUS | Status: AC
  Administered 2020-01-01: 1260 mg via INTRAVENOUS
  Filled 2020-01-01: qty 63

## 2020-01-01 MED ORDER — SODIUM CHLORIDE 0.9 % IV SOLN
Freq: Once | INTRAVENOUS | Status: AC
  Filled 2020-01-01: qty 250

## 2020-01-01 MED ORDER — HEPARIN SOD (PORK) LOCK FLUSH 100 UNIT/ML IV SOLN
500.0000 [IU] | Freq: Once | INTRAVENOUS | Status: AC | PRN
  Administered 2020-01-01: 500 [IU]
  Filled 2020-01-01: qty 5

## 2020-01-01 MED ORDER — SODIUM CHLORIDE 0.9 % IV SOLN
75.0000 mg/m2 | Freq: Once | INTRAVENOUS | Status: AC
  Administered 2020-01-01: 160 mg via INTRAVENOUS
  Filled 2020-01-01: qty 16

## 2020-01-01 MED ORDER — PALONOSETRON HCL INJECTION 0.25 MG/5ML
0.2500 mg | Freq: Once | INTRAVENOUS | Status: AC
  Administered 2020-01-01: 0.25 mg via INTRAVENOUS

## 2020-01-01 NOTE — Patient Instructions (Signed)
[facility] Discharge Instructions for Patients Receiving Chemotherapy  Today you received the following chemotherapy agents: Taxotere/Cytoxan.  To help prevent nausea and vomiting after your treatment, we encourage you to take your nausea medication as directed.   If you develop nausea and vomiting that is not controlled by your nausea medication, call the clinic.   BELOW ARE SYMPTOMS THAT SHOULD BE REPORTED IMMEDIATELY:  *FEVER GREATER THAN 100.5 F  *CHILLS WITH OR WITHOUT FEVER  NAUSEA AND VOMITING THAT IS NOT CONTROLLED WITH YOUR NAUSEA MEDICATION  *UNUSUAL SHORTNESS OF BREATH  *UNUSUAL BRUISING OR BLEEDING  TENDERNESS IN MOUTH AND THROAT WITH OR WITHOUT PRESENCE OF ULCERS  *URINARY PROBLEMS  *BOWEL PROBLEMS  UNUSUAL RASH Items with * indicate a potential emergency and should be followed up as soon as possible.  Feel free to call the clinic should you have any questions or concerns. The clinic phone number is [phone].  Please show the CHEMO ALERT CARD at check-in to the Emergency Department and triage nurse.   

## 2020-01-01 NOTE — Assessment & Plan Note (Signed)
09/25/2019:Left lumpectomy [facility]): IDC, grade 2, 1.2cm, with intermediate grade DCIS, 5 axillary lymph nodes negative, clear margins.HER-2 - by FISH, ER+ 90%, PR+ 100%, Ki67 2%, Oncotype DX score 26  Treatment plan: 1.Adjuvant chemotherapy with Taxotere and Cytoxan every 3 weeks x 4 2.Adjuvant radiation therapy followed by 3.Adjuvant antiestrogen therapy ------------------------------------------------------------------------------------------------------------------------------------------------------------------- Current treatment: Cycle 3Taxotere and Cytoxan Chemo toxicities: 1.  Bone pain: Due to Udenyca.  She will take Tylenol for prevention. 2.  Fatigue: Stable this past week she is done extremely well. Denies any nausea or vomiting.  Denies nausea or vomiting We discontinued dexamethasone oral.  Return to clinic in3weeksfor cycle 4

## 2020-01-03 ENCOUNTER — Other Ambulatory Visit: Payer: Self-pay

## 2020-01-03 ENCOUNTER — Inpatient Hospital Stay: Payer: BC Managed Care – PPO

## 2020-01-03 VITALS — BP 129/69 | HR 90 | Temp 98.7°F | Resp 17

## 2020-01-03 DIAGNOSIS — C50412 Malignant neoplasm of upper-outer quadrant of left female breast: Secondary | ICD-10-CM | POA: Diagnosis not present

## 2020-01-03 DIAGNOSIS — Z170 Estrogen receptor positive status [ER+]: Secondary | ICD-10-CM

## 2020-01-03 MED ORDER — PEGFILGRASTIM-JMDB 6 MG/0.6ML ~~LOC~~ SOSY
PREFILLED_SYRINGE | SUBCUTANEOUS | Status: AC
  Filled 2020-01-03: qty 0.6

## 2020-01-03 MED ORDER — PEGFILGRASTIM-JMDB 6 MG/0.6ML ~~LOC~~ SOSY
6.0000 mg | PREFILLED_SYRINGE | Freq: Once | SUBCUTANEOUS | Status: AC
  Administered 2020-01-03: 12:00:00 6 mg via SUBCUTANEOUS

## 2020-01-21 NOTE — Progress Notes (Signed)
Patient Care Team: Joella Prince as PCP - General (Physician Assistant) Mauro Kaufmann, RN as Oncology Nurse Navigator Rockwell Germany, RN as Oncology Nurse Navigator  DIAGNOSIS:    ICD-10-CM   1. Malignant neoplasm of upper-outer quadrant of left breast in female, estrogen receptor positive ([place])  C50.412    Z17.0     SUMMARY OF ONCOLOGIC HISTORY: Oncology History  Malignant neoplasm of upper-outer quadrant of left breast in female, estrogen receptor positive ([place])  09/03/2019 Initial Diagnosis   Routine screening mammogram detected a 1.4cm indeterminate left breast mass. US showed a 1.0cm left breast mass and a 0.5cm lymph node with cortical thickening at the 2:00 position in the left breast. 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   09/10/2019 Cancer Staging   Staging form: Breast, AJCC 8th Edition - Clinical stage from 09/10/2019: Stage IA (cT1c, cN0, cM0, G2, ER+, PR+, HER2-) - Signed by Nicholas Lose, MD on 09/10/2019   09/25/2019 Surgery   Left lumpectomy [facility]): IDC, grade 2, 1.2cm, with intermediate grade DCIS, 5 axillary lymph nodes negative, clear margins.    09/25/2019 Cancer Staging   Staging form: Breast, AJCC 8th Edition - Pathologic stage from 09/25/2019: Stage IA (pT1c, pN0, cM0, G2, ER+, PR+, HER2-) - Signed by Gardenia Phlegm, NP on 10/15/2019   11/03/2019 Oncotype testing   Oncotype score 26   11/20/2019 -  Chemotherapy   The patient had palonosetron (ALOXI) injection 0.25 mg, 0.25 mg, Intravenous,  Once, 3 of 4 cycles Administration: 0.25 mg (11/20/2019), 0.25 mg (12/11/2019), 0.25 mg (01/01/2020) pegfilgrastim-jmdb (FULPHILA) injection 6 mg, 6 mg, Subcutaneous,  Once, 3 of 4 cycles Administration: 6 mg (11/22/2019), 6 mg (12/13/2019), 6 mg (01/03/2020) cyclophosphamide (CYTOXAN) 1,260 mg in sodium chloride 0.9 % 250 mL chemo infusion, 600 mg/m2 = 1,260 mg, Intravenous,  Once, 3  of 4 cycles Administration: 1,260 mg (11/20/2019), 1,260 mg (12/11/2019), 1,260 mg (01/01/2020) DOCEtaxel (TAXOTERE) 160 mg in sodium chloride 0.9 % 250 mL chemo infusion, 75 mg/m2 = 160 mg, Intravenous,  Once, 3 of 4 cycles Administration: 160 mg (11/20/2019), 160 mg (12/11/2019), 160 mg (01/01/2020)  for chemotherapy treatment.      CHIEF COMPLIANT: Cycle4Taxotere and Cytoxan  INTERVAL HISTORY: Hannah Wallace is a 64 y.o. with above-mentioned history of left breast cancerwhounderwent a lumpectomyand is currently on adjuvant chemotherapy with Taxotere and Cytoxan.She presents to the clinic todayforcycle 4. She has tolerated cycle 3 chemotherapy fairly well.  She did have mild low back pain for 1 to 2 days.  Denies any nausea or vomiting.  Denies constipation.  She had more fatigue this last treatment.  ALLERGIES:  has No Known Allergies.  MEDICATIONS:  Current Outpatient Medications  Medication Sig Dispense Refill  . lidocaine-prilocaine (EMLA) cream Apply to affected area once 30 g 3  . Omega-3 Fatty Acids (FISH OIL) 1000 MG CAPS Take 1,000 mg by mouth 2 (two) times daily.    . ondansetron (ZOFRAN) 8 MG tablet Take 1 tablet (8 mg total) by mouth 2 (two) times daily as needed for refractory nausea / vomiting. Start on day 3 after chemo. 30 tablet 1  . Polyethyl Glycol-Propyl Glycol (SYSTANE) 0.4-0.3 % SOLN Place 1 drop into both eyes daily.    . prochlorperazine (COMPAZINE) 10 MG tablet Take 1 tablet (10 mg total) by mouth every 6 (six) hours as needed (Nausea or vomiting). 30 tablet 1  .  UNABLE TO FIND [employer] Thyroid Complete    . Vitamin D-Vitamin K (VITAMIN K2-VITAMIN D3 PO) Take 1 tablet by mouth daily. D3 125 mcg K2 90 mcg    . vitamin E 180 MG (400 UNITS) capsule Take 400 Units by mouth daily.     No current facility-administered medications for this visit.    PHYSICAL EXAMINATION: ECOG PERFORMANCE STATUS: 1 - Symptomatic but completely ambulatory  There were no vitals  filed for this visit. There were no vitals filed for this visit.  LABORATORY DATA:  I have reviewed the data as listed CMP Latest Ref Rng & Units 01/01/2020 12/11/2019 11/27/2019  Glucose 70 - 99 mg/dL 90 81 90  BUN 8 - 23 mg/dL '21 23 22  '$ Creatinine 0.44 - 1.00 mg/dL 0.78 0.94 0.84  Sodium 135 - 145 mmol/L 139 138 138  Potassium 3.5 - 5.1 mmol/L 4.3 3.8 4.1  Chloride 98 - 111 mmol/L 106 103 105  CO2 22 - 32 mmol/L 24 24 21(L)  Calcium 8.9 - 10.3 mg/dL 9.5 9.1 10.2  Total Protein 6.5 - 8.1 g/dL 7.2 7.0 7.2  Total Bilirubin 0.3 - 1.2 mg/dL 0.4 1.0 0.3  Alkaline Phos 38 - 126 U/L 86 59 75  AST 15 - 41 U/L '16 16 20  '$ ALT 0 - 44 U/L '13 18 14    '$ Lab Results  Component Value Date   WBC 12.6 (H) 01/01/2020   HGB 12.3 01/01/2020   HCT 37.3 01/01/2020   MCV 89.9 01/01/2020   PLT 358 01/01/2020   NEUTROABS 9.7 (H) 01/01/2020    ASSESSMENT & PLAN:  Malignant neoplasm of upper-outer quadrant of left breast in female, estrogen receptor positive ([place]) 09/25/2019:Left lumpectomy ([place]): IDC, grade 2, 1.2cm, with intermediate grade DCIS, 5 axillary lymph nodes negative, clear margins.HER-2 - by FISH, ER+ 90%, PR+ 100%, Ki67 2%, Oncotype DX score 26  Treatment plan: 1.Adjuvant chemotherapy with Taxotere and Cytoxan every 3 weeks x 4 2.Adjuvant radiation therapy followed by 3.Adjuvant antiestrogen therapy ------------------------------------------------------------------------------------------------------------------------------------------------------------------- Current treatment: Cycle4Taxotere and Cytoxan Chemo toxicities: 1.Bone pain: Due to Udenyca. It is very mild now on Tylenol as preventative.Marland Kitchen 2.Fatigue: Slightly more fatigue with the last cycle Denies peripheral neuropathy. Denies any nausea or vomiting. Today's last dose of chemotherapy. Subsequently she will receive adjuvant radiation. I will request Dr. Barry Dienes to remove her port.  Return to clinic at the  end of radiation to start antiestrogen therapy.    No orders of the defined types were placed in this encounter.  The patient has a good understanding of the overall plan. she agrees with it. she will call with any problems that may develop before the next visit here.  Total time spent: 30 mins including face to face time and time spent for planning, charting and coordination of care  Nicholas Lose, MD 01/22/2020  I, Cloyde Reams Dorshimer, am acting as scribe for Dr. Nicholas Lose.  I have reviewed the above documentation for accuracy and completeness, and I agree with the above.

## 2020-01-22 ENCOUNTER — Encounter: Payer: Self-pay | Admitting: Medical Oncology

## 2020-01-22 ENCOUNTER — Ambulatory Visit: Payer: BC Managed Care – PPO | Admitting: Hematology and Oncology

## 2020-01-22 ENCOUNTER — Inpatient Hospital Stay (HOSPITAL_BASED_OUTPATIENT_CLINIC_OR_DEPARTMENT_OTHER): Payer: BC Managed Care – PPO | Admitting: Hematology and Oncology

## 2020-01-22 ENCOUNTER — Inpatient Hospital Stay: Payer: BC Managed Care – PPO

## 2020-01-22 ENCOUNTER — Other Ambulatory Visit: Payer: BC Managed Care – PPO

## 2020-01-22 ENCOUNTER — Other Ambulatory Visit: Payer: Self-pay | Admitting: *Deleted

## 2020-01-22 ENCOUNTER — Inpatient Hospital Stay: Payer: BC Managed Care – PPO | Attending: Hematology and Oncology

## 2020-01-22 ENCOUNTER — Other Ambulatory Visit: Payer: Self-pay

## 2020-01-22 ENCOUNTER — Ambulatory Visit: Payer: BC Managed Care – PPO

## 2020-01-22 DIAGNOSIS — Z006 Encounter for examination for normal comparison and control in clinical research program: Secondary | ICD-10-CM | POA: Insufficient documentation

## 2020-01-22 DIAGNOSIS — Z170 Estrogen receptor positive status [ER+]: Secondary | ICD-10-CM | POA: Insufficient documentation

## 2020-01-22 DIAGNOSIS — C50412 Malignant neoplasm of upper-outer quadrant of left female breast: Secondary | ICD-10-CM

## 2020-01-22 DIAGNOSIS — G893 Neoplasm related pain (acute) (chronic): Secondary | ICD-10-CM | POA: Diagnosis not present

## 2020-01-22 DIAGNOSIS — Z5111 Encounter for antineoplastic chemotherapy: Secondary | ICD-10-CM | POA: Insufficient documentation

## 2020-01-22 DIAGNOSIS — Z7189 Other specified counseling: Secondary | ICD-10-CM | POA: Diagnosis not present

## 2020-01-22 LAB — CMP (CANCER CENTER ONLY)
ALT: 9 U/L (ref 0–44)
AST: 15 U/L (ref 15–41)
Albumin: 3.7 g/dL (ref 3.5–5.0)
Alkaline Phosphatase: 81 U/L (ref 38–126)
Anion gap: 9 (ref 5–15)
BUN: 20 mg/dL (ref 8–23)
CO2: 21 mmol/L — ABNORMAL LOW (ref 22–32)
Calcium: 8.8 mg/dL — ABNORMAL LOW (ref 8.9–10.3)
Chloride: 109 mmol/L (ref 98–111)
Creatinine: 0.68 mg/dL (ref 0.44–1.00)
GFR, Est AFR Am: >60 mL/min (ref >60)
GFR, Estimated: >60 mL/min (ref >60)
Glucose, Bld: 91 mg/dL (ref 70–99)
Potassium: 3.9 mmol/L (ref 3.5–5.1)
Sodium: 139 mmol/L (ref 135–145)
Total Bilirubin: 0.3 mg/dL (ref 0.3–1.2)
Total Protein: 6.7 g/dL (ref 6.5–8.1)

## 2020-01-22 LAB — CBC WITH DIFFERENTIAL (CANCER CENTER ONLY)
Abs Immature Granulocytes: 0.06 10*3/uL (ref 0.00–0.07)
Basophils Absolute: 0.1 10*3/uL (ref 0.0–0.1)
Basophils Relative: 1 %
Eosinophils Absolute: 0 10*3/uL (ref 0.0–0.5)
Eosinophils Relative: 0 %
HCT: 36.8 % (ref 36.0–46.0)
Hemoglobin: 12.4 g/dL (ref 12.0–15.0)
Immature Granulocytes: 1 %
Lymphocytes Relative: 11 %
Lymphs Abs: 1.3 10*3/uL (ref 0.7–4.0)
MCH: 30.5 pg (ref 26.0–34.0)
MCHC: 33.7 g/dL (ref 30.0–36.0)
MCV: 90.4 fL (ref 80.0–100.0)
Monocytes Absolute: 1.1 10*3/uL — ABNORMAL HIGH (ref 0.1–1.0)
Monocytes Relative: 9 %
Neutro Abs: 9.6 10*3/uL — ABNORMAL HIGH (ref 1.7–7.7)
Neutrophils Relative %: 78 %
Platelet Count: 290 10*3/uL (ref 150–400)
RBC: 4.07 MIL/uL (ref 3.87–5.11)
RDW: 16.3 % — ABNORMAL HIGH (ref 11.5–15.5)
WBC Count: 12.3 10*3/uL — ABNORMAL HIGH (ref 4.0–10.5)
nRBC: 0 % (ref 0.0–0.2)

## 2020-01-22 MED ORDER — PALONOSETRON HCL INJECTION 0.25 MG/5ML
INTRAVENOUS | Status: AC
  Filled 2020-01-22: qty 5

## 2020-01-22 MED ORDER — DEXAMETHASONE SODIUM PHOSPHATE 10 MG/ML IJ SOLN
10.0000 mg | Freq: Once | INTRAMUSCULAR | Status: AC
  Administered 2020-01-22: 09:00:00 10 mg via INTRAVENOUS

## 2020-01-22 MED ORDER — SODIUM CHLORIDE 0.9% FLUSH
10.0000 mL | INTRAVENOUS | Status: DC | PRN
  Administered 2020-01-22: 11:00:00 10 mL
  Filled 2020-01-22: qty 10

## 2020-01-22 MED ORDER — SODIUM CHLORIDE 0.9 % IV SOLN
75.0000 mg/m2 | Freq: Once | INTRAVENOUS | Status: AC
  Administered 2020-01-22: 10:00:00 160 mg via INTRAVENOUS
  Filled 2020-01-22: qty 16

## 2020-01-22 MED ORDER — HEPARIN SOD (PORK) LOCK FLUSH 100 UNIT/ML IV SOLN
500.0000 [IU] | Freq: Once | INTRAVENOUS | Status: AC | PRN
  Administered 2020-01-22: 11:00:00 500 [IU]
  Filled 2020-01-22: qty 5

## 2020-01-22 MED ORDER — SODIUM CHLORIDE 0.9 % IV SOLN
600.0000 mg/m2 | Freq: Once | INTRAVENOUS | Status: AC
  Administered 2020-01-22: 11:00:00 1260 mg via INTRAVENOUS
  Filled 2020-01-22: qty 63

## 2020-01-22 MED ORDER — SODIUM CHLORIDE 0.9 % IV SOLN
Freq: Once | INTRAVENOUS | Status: AC
  Filled 2020-01-22: qty 250

## 2020-01-22 MED ORDER — PALONOSETRON HCL INJECTION 0.25 MG/5ML
0.2500 mg | Freq: Once | INTRAVENOUS | Status: AC
  Administered 2020-01-22: 09:00:00 0.25 mg via INTRAVENOUS

## 2020-01-22 MED ORDER — DEXAMETHASONE SODIUM PHOSPHATE 10 MG/ML IJ SOLN
INTRAMUSCULAR | Status: AC
  Filled 2020-01-22: qty 1

## 2020-01-22 NOTE — Progress Notes (Signed)
W7218, A PROSPECTIVE OBSERVATIONAL COHORT STUDY TO DEVELOP A PREDICTIVE MODEL OF TAXANE-INDUCED PERIPHERAL NEUROPATHY IN CANCER PATIENTS. 8 weeks Visit. Patient presented to the clinic, alone, for today's clinic appointments. I met with patient after her lab and MD appointment to complete her assessments.   PROs: Questionnaires were given to patient to complete in clinic. Collected questionnaires and checked for completeness and accuracy.  Labs: Patient did not consent to the optional blood collection.  Physician Assessments: CTCAE and Treatment Burden forms completed and signed by Dr. Lindi Adie. All patient's questions/concerns addressed by MD.  Treatment: Patient to receive 4th Docetaxel treatment today and completion of chemotherapy. No treatment delays or modifications have occurred.  History of Falls: assessment not required at this time point.    Solicited Neuropathy Events: patient reports some tingling to fingers/hands and toes/feet- "a little" and denies having numbness in extremities.  Assessment for Interventions for CIPN: Reviewed with patient and CRFs completed. Patient reports she continues using Urea Cream 40, BID, for her hands and feet for moisturizing and to help minimize any possible neuropathy issues. Patient reports some S&S of tingling to hand and feet. .  Neuropen Assessment: Completed per protocol by this certified research nurse. [place] Assessment: Completed per protocol by this Film/video editor, time and documentation completed by research nurse, Wilber Bihari.  Timed Get Up and Go Test: Not required at this visit. Plan: Informed patient of next study assessments in approximately 4 weeks and we will try to complete on on same day as when she is in clinic.   Patient denied having any questions at this time. Patient thanked for her time and contribution to study and was encouraged to call clinic for any questions or concerns she may have prior to her next appointment.   Maxwell Marion, RN, BSN, [facility] Clinical Research 01/22/2020 9:51 AM

## 2020-01-22 NOTE — Patient Instructions (Signed)
[facility] Discharge Instructions for Patients Receiving Chemotherapy  Today you received the following chemotherapy agents: docetaxel and cyclophosphamide.  To help prevent nausea and vomiting after your treatment, we encourage you to take your nausea medication as directed.   If you develop nausea and vomiting that is not controlled by your nausea medication, call the clinic.   BELOW ARE SYMPTOMS THAT SHOULD BE REPORTED IMMEDIATELY:  *FEVER GREATER THAN 100.5 F  *CHILLS WITH OR WITHOUT FEVER  NAUSEA AND VOMITING THAT IS NOT CONTROLLED WITH YOUR NAUSEA MEDICATION  *UNUSUAL SHORTNESS OF BREATH  *UNUSUAL BRUISING OR BLEEDING  TENDERNESS IN MOUTH AND THROAT WITH OR WITHOUT PRESENCE OF ULCERS  *URINARY PROBLEMS  *BOWEL PROBLEMS  UNUSUAL RASH Items with * indicate a potential emergency and should be followed up as soon as possible.  Feel free to call the clinic should you have any questions or concerns. The clinic phone number is [phone].  Please show the CHEMO ALERT CARD at check-in to the Emergency Department and triage nurse.   

## 2020-01-22 NOTE — Assessment & Plan Note (Signed)
09/25/2019:Left lumpectomy [facility]): IDC, grade 2, 1.2cm, with intermediate grade DCIS, 5 axillary lymph nodes negative, clear margins.HER-2 - by FISH, ER+ 90%, PR+ 100%, Ki67 2%, Oncotype DX score 26  Treatment plan: 1.Adjuvant chemotherapy with Taxotere and Cytoxan every 3 weeks x 4 2.Adjuvant radiation therapy followed by 3.Adjuvant antiestrogen therapy ------------------------------------------------------------------------------------------------------------------------------------------------------------------- Current treatment: Cycle4Taxotere and Cytoxan Chemo toxicities: 1.Bone pain: Due to Udenyca. She will take Tylenol for prevention. 2.Fatigue: Stablethis past week she is done extremely well. 3.  Sore in the nose 4.  Mild constipation  Denies any nausea or vomiting. Today's last dose of chemotherapy. Subsequently she will receive adjuvant radiation.  Return to clinic at the end of radiation to start antiestrogen therapy.

## 2020-01-22 NOTE — Patient Instructions (Signed)
Tunneled Central Venous Catheter Flushing Guide  It is important to flush your tunneled central venous catheter each time you use it, both before and after you use it. Flushing your catheter will help prevent it from clogging. What are the risks? Risks may include:  Infection.  Air getting into the catheter and bloodstream. Supplies needed:  A clean pair of gloves.  A disinfecting wipe. Use an alcohol wipe, chlorhexidine wipe, or iodine wipe as told by your health care provider.  A 10 mL syringe that has been prefilled with saline solution.  An empty 10 mL syringe, if a substance called heparin was injected into your catheter. How to flush your catheter When you flush your catheter, make sure you follow any specific instructions from your health care provider or the manufacturer. These are general guidelines. Flushing your catheter before use If there is heparin in your catheter: 1. Wash your hands with soap and water. 2. Put on gloves. 3. Scrub the injection cap for a minimum of 15 seconds with a disinfecting wipe. 4. Unclamp the catheter. 5. Attach the empty syringe to the injection cap. 6. Pull the syringe plunger back and withdraw 10 mL of blood. 7. Place the syringe into an appropriate waste container. 8. Scrub the injection cap for 15 seconds with a disinfecting wipe. 9. Attach the prefilled syringe to the injection cap. 10. Flush the catheter by pushing the plunger forward until all the liquid from the syringe is in the catheter. 11. Remove the syringe from the injection cap. 12. Clamp the catheter. If there is no heparin in your catheter: 1. Wash your hands with soap and water. 2. Put on gloves. 3. Scrub the injection cap for 15 seconds with a disinfecting wipe. 4. Unclamp the catheter. 5. Attach the prefilled syringe to the injection cap. 6. Flush the catheter by pushing the plunger forward until 5 mL of the liquid from the syringe is in the catheter. 7. Pull back on  the syringe until you see blood in the catheter. 8. If you have been asked to collect any blood, follow your health care provider's instructions. Otherwise, flush the catheter with the rest of the solution from the syringe. 9. Remove the syringe from the injection cap. 10. Clamp the catheter.  Flushing your catheter after use 1. Wash your hands with soap and water. 2. Put on gloves. 3. Scrub the injection cap for 15 seconds with a disinfecting wipe. 4. Unclamp the catheter. 5. Attach the prefilled syringe to the injection cap. 6. Flush the catheter by pushing the plunger forward until all of the liquid from the syringe is in the catheter. 7. Remove the syringe from the injection cap. 8. Clamp the catheter. Problems and solutions  If blood cannot be completely cleared from the injection cap, you may need to have the injection cap replaced.  If the catheter is difficult to flush, use the pulsing method. The pulsing method involves pushing only a few milliliters of solution into the catheter at a time and pausing between pushes.  If you do not see blood in the catheter when you pull back on the syringe, change your body position, such as by raising your arms above your head. Take a deep breath and cough. Then, pull back on the syringe. If you still do not see blood, flush the catheter with a small amount of solution. Then, change positions again and take a breath or cough. Pull back on the syringe again. If you still do not see   blood, finish flushing the catheter and contact your health care provider. Do not use your catheter until your health care provider says it is okay. General tips  Have someone help you flush your catheter, if possible.  Do not force fluid through your catheter.  Do not use a syringe that is larger or smaller than 10 mL. Using a smaller syringe can make the catheter burst.  Do not use your catheter without flushing it first if it has heparin in it. Contact a health  care provider if:  You cannot see any blood in the catheter when you flush it before using it.  Your catheter is difficult to flush. Get help right away if:  You cannot flush the catheter.  The catheter leaks when you flush it or when there is fluid in it.  There are cracks or breaks in the catheter. Summary  It is important to flush your tunneled central venous catheter each time you use it, both before and after you use it.  Scrub the injection cap for 15 seconds with a disinfecting wipe before and after you flush it.  When you flush your catheter, make sure you follow any specific instructions from your health care provider or the manufacturer.  Get help right away if you cannot flush the catheter. This information is not intended to replace advice given to you by your health care provider. Make sure you discuss any questions you have with your health care provider. Document Revised: 07/18/2019 Document Reviewed: 01/08/2019 Elsevier Patient Education  2020 [employer]  

## 2020-01-24 ENCOUNTER — Other Ambulatory Visit: Payer: Self-pay

## 2020-01-24 ENCOUNTER — Inpatient Hospital Stay: Payer: BC Managed Care – PPO

## 2020-01-24 VITALS — BP 132/76 | HR 95 | Temp 98.0°F | Resp 17

## 2020-01-24 DIAGNOSIS — C50412 Malignant neoplasm of upper-outer quadrant of left female breast: Secondary | ICD-10-CM | POA: Diagnosis not present

## 2020-01-24 DIAGNOSIS — Z170 Estrogen receptor positive status [ER+]: Secondary | ICD-10-CM

## 2020-01-24 MED ORDER — PEGFILGRASTIM-JMDB 6 MG/0.6ML ~~LOC~~ SOSY
6.0000 mg | PREFILLED_SYRINGE | Freq: Once | SUBCUTANEOUS | Status: AC
  Administered 2020-01-24: 6 mg via SUBCUTANEOUS

## 2020-01-24 NOTE — Patient Instructions (Signed)

## 2020-01-27 ENCOUNTER — Encounter: Payer: Self-pay | Admitting: *Deleted

## 2020-01-29 ENCOUNTER — Other Ambulatory Visit: Payer: Self-pay | Admitting: General Surgery

## 2020-02-04 ENCOUNTER — Telehealth: Payer: Self-pay | Admitting: Hematology and Oncology

## 2020-02-04 ENCOUNTER — Encounter: Payer: Self-pay | Admitting: *Deleted

## 2020-02-04 NOTE — Telephone Encounter (Signed)
Scheduled appt per 3/31 sch message - pt request just schedule one flush for now

## 2020-02-05 ENCOUNTER — Encounter: Payer: Self-pay | Admitting: *Deleted

## 2020-02-06 ENCOUNTER — Telehealth: Payer: Self-pay | Admitting: Medical Oncology

## 2020-02-06 NOTE — Telephone Encounter (Signed)
[id number] Call to patient informing her that her 12 week study assessments are coming up due. Inquired with patient if she would like to complete these when in clinic on the 8th for another appointment. Patient confirmed she would prefer to complete the assessments prior to her 10:00 RT Nurse eval appointment on the 8th. I thanked patient and informed her I will schedule her for 0930 for [phone] 12 week assessment with me, on April 8th. Patient knows to call with questions.

## 2020-02-11 ENCOUNTER — Other Ambulatory Visit: Payer: Self-pay

## 2020-02-11 ENCOUNTER — Encounter: Payer: Self-pay | Admitting: Radiation Oncology

## 2020-02-11 ENCOUNTER — Telehealth: Payer: Self-pay

## 2020-02-11 NOTE — Telephone Encounter (Signed)
Appointment reminder phone call patient verbalized she understood this is a in person appointment and will be present for appointment

## 2020-02-12 ENCOUNTER — Encounter: Payer: Self-pay | Admitting: Medical Oncology

## 2020-02-12 ENCOUNTER — Other Ambulatory Visit: Payer: Self-pay

## 2020-02-12 ENCOUNTER — Ambulatory Visit
Admission: RE | Admit: 2020-02-12 | Discharge: 2020-02-12 | Disposition: A | Discharge status: Home / Self Care | Payer: BC Managed Care – PPO | Source: Ambulatory Visit | Attending: Radiation Oncology | Admitting: Radiation Oncology

## 2020-02-12 ENCOUNTER — Inpatient Hospital Stay: Payer: BC Managed Care – PPO | Attending: Hematology and Oncology | Admitting: Medical Oncology

## 2020-02-12 ENCOUNTER — Encounter: Payer: Self-pay | Admitting: *Deleted

## 2020-02-12 DIAGNOSIS — C50412 Malignant neoplasm of upper-outer quadrant of left female breast: Secondary | ICD-10-CM

## 2020-02-12 DIAGNOSIS — Z170 Estrogen receptor positive status [ER+]: Secondary | ICD-10-CM

## 2020-02-12 DIAGNOSIS — Z510 Encounter for antineoplastic radiation therapy: Secondary | ICD-10-CM | POA: Insufficient documentation

## 2020-02-12 NOTE — Progress Notes (Signed)
Radiation Oncology         [phone] ________________________________  Name: Hannah Wallace        MRN: [id number]  Date of Service: 02/12/2020 DOB: 1955-11-09  [id number], Meghan H, PA-C  Nicholas Lose, MD     REFERRING PHYSICIAN: Nicholas Lose, MD   DIAGNOSIS: The encounter diagnosis was Malignant neoplasm of upper-outer quadrant of left breast in female, estrogen receptor positive ([place]).   HISTORY OF PRESENT ILLNESS: Hannah Wallace is a 64 y.o. female was originally seen in the multidisciplinary breast clinic for a new diagnosis of left breast cancer. The patient was noted to have a screening detected abnormality in the left breast.  A mass was seen in the upper outer quadrant measuring approximately 1.4 cm.  Further ultrasound imaging revealed a mass at 1 o'clock position measuring 1 cm and there was a intramammary lymph node that was prominent.  Her axillary lymph nodes were negative for adenopathy.  She underwent a biopsy on 09/03/2019 which revealed a negative intramammary lymph node.  Her breast biopsy revealed a grade 1-2 invasive ductal carcinoma.  She subsequently underwent lumpectomy on 09/25/2019 which revealed a grade 2,  1.2 cm invasive ductal carcinoma, 5 of her nodes that were sampled were all negative for disease and her margins were negative.  Her Oncotype score was 26 so she began systemic adjuvant chemotherapy on 11/20/2019 and completed her last cycle on 01/22/2020.  She is seen today to discuss moving forward with adjuvant radiotherapy.    PREVIOUS RADIATION THERAPY: No   PAST MEDICAL HISTORY:  Past Medical History:  Diagnosis Date  . Basal cell carcinoma    on back  . Cancer [facility])    breast cancer       PAST SURGICAL HISTORY: Past Surgical History:  Procedure Laterality Date  . BREAST LUMPECTOMY WITH RADIOACTIVE SEED AND SENTINEL LYMPH NODE BIOPSY Left 09/25/2019   Procedure: LEFT BREAST LUMPECTOMY WITH RADIOACTIVE SEED AND SENTINEL LYMPH NODE BIOPSY;   Surgeon: Stark Klein, MD;  Location: [place];  Service: General;  Laterality: Left;  . COLONOSCOPY    . PORTACATH PLACEMENT Left 11/19/2019   Procedure: INSERTION PORT-A-CATH;  Surgeon: Stark Klein, MD;  Location: [place];  Service: General;  Laterality: Left;     FAMILY HISTORY:  Family History  Problem Relation Age of Onset  . Lung cancer Father   . Hodgkin's lymphoma Sister   . Breast cancer Paternal Grandmother      SOCIAL HISTORY:  reports that she has never smoked. She has never used smokeless tobacco. She reports current alcohol use. She reports that she does not use drugs. The patient lives in [place]. She is interested in having treatment in [place].    ALLERGIES: Patient has no known allergies.   MEDICATIONS:  Current Outpatient Medications  Medication Sig Dispense Refill  . Omega-3 Fatty Acids (FISH OIL) 1000 MG CAPS Take 1,000 mg by mouth 2 (two) times daily.    . ondansetron (ZOFRAN) 8 MG tablet Take 1 tablet (8 mg total) by mouth 2 (two) times daily as needed for refractory nausea / vomiting. Start on day 3 after chemo. 30 tablet 1  . Polyethyl Glycol-Propyl Glycol (SYSTANE) 0.4-0.3 % SOLN Place 1 drop into both eyes daily.    . prochlorperazine (COMPAZINE) 10 MG tablet Take 1 tablet (10 mg total) by mouth every 6 (six) hours as needed (Nausea or vomiting). 30 tablet 1  . Vitamin D-Vitamin K (VITAMIN K2-VITAMIN D3 PO) Take 1 tablet by  mouth daily. D3 125 mcg K2 90 mcg    . vitamin E 180 MG (400 UNITS) capsule Take 400 Units by mouth daily.    Marland Kitchen lidocaine-prilocaine (EMLA) cream Apply to affected area once (Patient not taking: Reported on 02/11/2020) 30 g 3  . UNABLE TO FIND [employer] Thyroid Complete     No current facility-administered medications for this encounter.     REVIEW OF SYSTEMS: On review of systems, the patient reports that she is doing well overall. She has mild tiredness and some LUE swelling that she'd like to get back to  PT for evaluation. She denies any chest pain, shortness of breath, cough, fevers, chills, night sweats, unintended weight changes. She denies any bowel or bladder disturbances, and denies abdominal pain, nausea or vomiting. She denies any new musculoskeletal or joint aches or pains. A complete review of systems is obtained and is otherwise negative.     PHYSICAL EXAM:  Wt Readings from Last 3 Encounters:  01/22/20 194 lb 9.6 oz (88.3 kg)  01/01/20 192 lb 4.8 oz (87.2 kg)  12/11/19 191 lb 8 oz (86.9 kg)   Temp Readings from Last 3 Encounters:  01/24/20 98 F (36.7 C)  01/22/20 98.5 F (36.9 C) (Temporal)  01/03/20 98.7 F (37.1 C) (Temporal)   BP Readings from Last 3 Encounters:  01/24/20 132/76  01/22/20 (!) 107/58  01/03/20 129/69   Pulse Readings from Last 3 Encounters:  01/24/20 95  01/22/20 93  01/03/20 90   In general this is a well appearing caucasian female in no acute distress. She's alert and oriented x4 and appropriate throughout the examination. Cardiopulmonary assessment is negative for acute distress and she exhibits normal effort. Bilateral breast exam is deferred. Mild trace edema is noted of the forearm of the LUE.    ECOG = 0  0 - Asymptomatic (Fully active, able to carry on all predisease activities without restriction)  1 - Symptomatic but completely ambulatory (Restricted in physically strenuous activity but ambulatory and able to carry out work of a light or sedentary nature. For example, light housework, office work)  2 - Symptomatic, <50% in bed during the day (Ambulatory and capable of all self care but unable to carry out any work activities. Up and about more than 50% of waking hours)  3 - Symptomatic, >50% in bed, but not bedbound (Capable of only limited self-care, confined to bed or chair 50% or more of waking hours)  4 - Bedbound (Completely disabled. Cannot carry on any self-care. Totally confined to bed or chair)  5 - Death   Eustace Pen MM,  Creech RH, Tormey DC, et al. [phone]). "Toxicity and response criteria of the [facility] Group". [place] Oncol. 5 (6): 649-55    LABORATORY DATA:  Lab Results  Component Value Date   WBC 12.3 (H) 01/22/2020   HGB 12.4 01/22/2020   HCT 36.8 01/22/2020   MCV 90.4 01/22/2020   PLT 290 01/22/2020   Lab Results  Component Value Date   NA 139 01/22/2020   K 3.9 01/22/2020   CL 109 01/22/2020   CO2 21 (L) 01/22/2020   Lab Results  Component Value Date   ALT 9 01/22/2020   AST 15 01/22/2020   ALKPHOS 81 01/22/2020   BILITOT 0.3 01/22/2020      RADIOGRAPHY: No results found.     IMPRESSION/PLAN: 1. 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 Dr. Lisbeth Renshaw discusses the  final pathology findings and reviews the nature of left breast disease. She's done well since completing chemotherapy and she is ready to proceed with external radiotherapy to the breast followed by antiestrogen therapy. We discussed the risks, benefits, short, and long term effects of radiotherapy, and the patient is interested in proceeding. Dr. Lisbeth Renshaw discusses the delivery and logistics of radiotherapy and anticipates a course of  6 1/2 weeks of radiotherapy to the left breast with deep inspiration breath hold technique. She will simulate today following our discussion. Written consent is obtained and placed in the chart, a copy was provided to the patient. 2. Concerns for LUE lymphedema. We will refer her back to PT for evaluation.   In a visit lasting 45 minutes, greater than 50% of the time was spent face to face discussing the patient's condition, in preparation for the discussion, and coordinating the patient's care.   The above documentation reflects my direct findings during this shared patient visit. Please see the separate note by Dr. Lisbeth Renshaw on this date for the remainder of the patient's plan of care.    Carola Rhine, PAC

## 2020-02-12 NOTE — Progress Notes (Signed)
D4446, A PROSPECTIVE OBSERVATIONAL COHORT STUDY TO DEVELOP A PREDICTIVE MODEL OF TAXANE-INDUCED PERIPHERAL NEUROPATHY IN CANCER PATIENTS. 12  weeks Visit. Patient presented to the clinic, alone, for today's clinic appointments. I met with patient just before her radiation therapy appointments to complete here 12 week assessments.  PROs: Questionnaires were given to patient to complete in clinic. Collected questionnaires and checked for completeness and accuracy.  Labs: Patient did not consent to the optional blood collection.  Physician Assessments: CTCAE and Treatment Burden forms completed and signed by Dr. Lindi Adie. Patient denies having any issues or concerns today.  Treatment: patient has completed chemotherapy treatments on 01/22/2020 and scheduled to start radiation treatment.  History of Falls: assessment not required at this time point.    Solicited Neuropathy Events: patient reports minor issue to finger tips, states it comes and goes and reports "no real problems."   Assessment for Interventions for CIPN: Reviewed with patient and CRFs completed. Patient reports she continues using Urea Cream 40, BID, for her hands and feet for moisturizing and to help minimize any possible neuropathy issues. Medications updated on study forms Neuropen Assessment: Completed per protocol by certified research nurse Wilber Bihari and recorded by this nurse.  [place] Assessment: Completed per protocol by certified research RN Wilber Bihari, time and documentation completed by this research nurse.  Timed Get Up and Go Test: Not required at this visit. Plan: Informed patient of next study assessments in approximately 12 weeks and I will notify her a few weeks ahead of date due. Patient is aware that next assessment will be in late July early August. Patient denied having any questions at this time. Patient thanked for her time and contribution to study and was encouraged to call me for any questions or  concerns she may have prior to her next appointment.  Maxwell Marion, RN, BSN, [facility] Clinical Research 02/12/2020 10:24 AM

## 2020-02-13 ENCOUNTER — Telehealth: Payer: Self-pay | Admitting: Hematology and Oncology

## 2020-02-13 NOTE — Telephone Encounter (Signed)
Scheduled appt per 4/8 sch message - mail letter with appt date and time

## 2020-02-16 ENCOUNTER — Ambulatory Visit
Admission: RE | Admit: 2020-02-16 | Discharge: 2020-02-16 | Disposition: A | Discharge status: Home / Self Care | Payer: BC Managed Care – PPO | Source: Ambulatory Visit | Attending: Radiation Oncology | Admitting: Radiation Oncology

## 2020-02-16 DIAGNOSIS — Z510 Encounter for antineoplastic radiation therapy: Secondary | ICD-10-CM | POA: Diagnosis not present

## 2020-02-17 ENCOUNTER — Other Ambulatory Visit: Payer: Self-pay

## 2020-02-17 ENCOUNTER — Ambulatory Visit
Admission: RE | Admit: 2020-02-17 | Discharge: 2020-02-17 | Disposition: A | Discharge status: Home / Self Care | Payer: BC Managed Care – PPO | Source: Ambulatory Visit | Attending: Radiation Oncology | Admitting: Radiation Oncology

## 2020-02-17 DIAGNOSIS — Z510 Encounter for antineoplastic radiation therapy: Secondary | ICD-10-CM | POA: Diagnosis not present

## 2020-02-18 ENCOUNTER — Other Ambulatory Visit: Payer: Self-pay

## 2020-02-18 ENCOUNTER — Ambulatory Visit
Admission: RE | Admit: 2020-02-18 | Discharge: 2020-02-18 | Disposition: A | Discharge status: Home / Self Care | Payer: BC Managed Care – PPO | Source: Ambulatory Visit | Attending: Radiation Oncology | Admitting: Radiation Oncology

## 2020-02-18 DIAGNOSIS — Z510 Encounter for antineoplastic radiation therapy: Secondary | ICD-10-CM | POA: Diagnosis not present

## 2020-02-19 ENCOUNTER — Other Ambulatory Visit: Payer: Self-pay

## 2020-02-19 ENCOUNTER — Ambulatory Visit
Admission: RE | Admit: 2020-02-19 | Discharge: 2020-02-19 | Disposition: A | Discharge status: Home / Self Care | Payer: BC Managed Care – PPO | Source: Ambulatory Visit | Attending: Radiation Oncology | Admitting: Radiation Oncology

## 2020-02-19 DIAGNOSIS — Z510 Encounter for antineoplastic radiation therapy: Secondary | ICD-10-CM | POA: Diagnosis not present

## 2020-02-20 ENCOUNTER — Ambulatory Visit
Admission: RE | Admit: 2020-02-20 | Discharge: 2020-02-20 | Disposition: A | Discharge status: Home / Self Care | Payer: BC Managed Care – PPO | Source: Ambulatory Visit | Attending: Radiation Oncology | Admitting: Radiation Oncology

## 2020-02-20 ENCOUNTER — Other Ambulatory Visit: Payer: Self-pay

## 2020-02-20 DIAGNOSIS — C50412 Malignant neoplasm of upper-outer quadrant of left female breast: Secondary | ICD-10-CM

## 2020-02-20 DIAGNOSIS — Z510 Encounter for antineoplastic radiation therapy: Secondary | ICD-10-CM | POA: Diagnosis not present

## 2020-02-20 MED ORDER — SONAFINE EX EMUL
1.0000 "application " | Freq: Once | CUTANEOUS | Status: AC
  Administered 2020-02-20: 1 via TOPICAL

## 2020-02-20 MED ORDER — ALRA NON-METALLIC DEODORANT (RAD-ONC)
1.0000 "application " | Freq: Once | TOPICAL | Status: AC
  Administered 2020-02-20: 1 via TOPICAL

## 2020-02-20 NOTE — Progress Notes (Signed)
Pt here for patient teaching.  Pt given Radiation and You booklet, skin care instructions, Alra deodorant and Sonafine.  Reviewed areas of pertinence such as fatigue, hair loss, skin changes, throat changes, breast tenderness and breast swelling . Pt able to give teach back of to pat skin and use unscented/gentle soap,apply Sonafine bid, avoid applying anything to skin within 4 hours of treatment, avoid wearing an under wire bra and to use an electric razor if they must shave. Pt verbalizes understanding of information given and will contact nursing with any questions or concerns.     Gloriajean Dell. Leonie Green, BSN

## 2020-02-23 ENCOUNTER — Other Ambulatory Visit: Payer: Self-pay

## 2020-02-23 ENCOUNTER — Encounter: Payer: Self-pay | Admitting: *Deleted

## 2020-02-23 ENCOUNTER — Ambulatory Visit
Admission: RE | Admit: 2020-02-23 | Discharge: 2020-02-23 | Disposition: A | Discharge status: Home / Self Care | Payer: BC Managed Care – PPO | Source: Ambulatory Visit | Attending: Radiation Oncology | Admitting: Radiation Oncology

## 2020-02-23 DIAGNOSIS — Z510 Encounter for antineoplastic radiation therapy: Secondary | ICD-10-CM | POA: Diagnosis not present

## 2020-02-24 ENCOUNTER — Other Ambulatory Visit: Payer: Self-pay

## 2020-02-24 ENCOUNTER — Ambulatory Visit
Admission: RE | Admit: 2020-02-24 | Discharge: 2020-02-24 | Disposition: A | Discharge status: Home / Self Care | Payer: BC Managed Care – PPO | Source: Ambulatory Visit | Attending: Radiation Oncology | Admitting: Radiation Oncology

## 2020-02-24 DIAGNOSIS — Z510 Encounter for antineoplastic radiation therapy: Secondary | ICD-10-CM | POA: Diagnosis not present

## 2020-02-25 ENCOUNTER — Ambulatory Visit
Admission: RE | Admit: 2020-02-25 | Discharge: 2020-02-25 | Disposition: A | Discharge status: Home / Self Care | Payer: BC Managed Care – PPO | Source: Ambulatory Visit | Attending: Radiation Oncology | Admitting: Radiation Oncology

## 2020-02-25 ENCOUNTER — Other Ambulatory Visit: Payer: Self-pay

## 2020-02-25 DIAGNOSIS — Z510 Encounter for antineoplastic radiation therapy: Secondary | ICD-10-CM | POA: Diagnosis not present

## 2020-02-26 ENCOUNTER — Ambulatory Visit
Admission: RE | Admit: 2020-02-26 | Discharge: 2020-02-26 | Disposition: A | Discharge status: Home / Self Care | Payer: BC Managed Care – PPO | Source: Ambulatory Visit | Attending: Radiation Oncology | Admitting: Radiation Oncology

## 2020-02-26 ENCOUNTER — Other Ambulatory Visit: Payer: Self-pay

## 2020-02-26 DIAGNOSIS — Z510 Encounter for antineoplastic radiation therapy: Secondary | ICD-10-CM | POA: Diagnosis not present

## 2020-02-27 ENCOUNTER — Ambulatory Visit
Admission: RE | Admit: 2020-02-27 | Discharge: 2020-02-27 | Disposition: A | Discharge status: Home / Self Care | Payer: BC Managed Care – PPO | Source: Ambulatory Visit | Attending: Radiation Oncology | Admitting: Radiation Oncology

## 2020-02-27 ENCOUNTER — Other Ambulatory Visit: Payer: Self-pay

## 2020-02-27 DIAGNOSIS — Z510 Encounter for antineoplastic radiation therapy: Secondary | ICD-10-CM | POA: Diagnosis not present

## 2020-03-01 ENCOUNTER — Ambulatory Visit
Admission: RE | Admit: 2020-03-01 | Discharge: 2020-03-01 | Disposition: A | Discharge status: Home / Self Care | Payer: BC Managed Care – PPO | Source: Ambulatory Visit | Attending: Radiation Oncology | Admitting: Radiation Oncology

## 2020-03-01 ENCOUNTER — Other Ambulatory Visit: Payer: Self-pay

## 2020-03-01 DIAGNOSIS — Z510 Encounter for antineoplastic radiation therapy: Secondary | ICD-10-CM | POA: Diagnosis not present

## 2020-03-02 ENCOUNTER — Ambulatory Visit
Admission: RE | Admit: 2020-03-02 | Discharge: 2020-03-02 | Disposition: A | Discharge status: Home / Self Care | Payer: BC Managed Care – PPO | Source: Ambulatory Visit | Attending: Radiation Oncology | Admitting: Radiation Oncology

## 2020-03-02 ENCOUNTER — Other Ambulatory Visit: Payer: Self-pay

## 2020-03-02 DIAGNOSIS — Z510 Encounter for antineoplastic radiation therapy: Secondary | ICD-10-CM | POA: Diagnosis not present

## 2020-03-03 ENCOUNTER — Other Ambulatory Visit: Payer: Self-pay

## 2020-03-03 ENCOUNTER — Ambulatory Visit
Admission: RE | Admit: 2020-03-03 | Discharge: 2020-03-03 | Disposition: A | Discharge status: Home / Self Care | Payer: BC Managed Care – PPO | Source: Ambulatory Visit | Attending: Radiation Oncology | Admitting: Radiation Oncology

## 2020-03-03 ENCOUNTER — Ambulatory Visit: Payer: BC Managed Care – PPO | Attending: Radiation Oncology

## 2020-03-03 DIAGNOSIS — Z170 Estrogen receptor positive status [ER+]: Secondary | ICD-10-CM | POA: Diagnosis present

## 2020-03-03 DIAGNOSIS — G8929 Other chronic pain: Secondary | ICD-10-CM | POA: Diagnosis present

## 2020-03-03 DIAGNOSIS — M25612 Stiffness of left shoulder, not elsewhere classified: Secondary | ICD-10-CM | POA: Insufficient documentation

## 2020-03-03 DIAGNOSIS — M25512 Pain in left shoulder: Secondary | ICD-10-CM | POA: Diagnosis present

## 2020-03-03 DIAGNOSIS — I890 Lymphedema, not elsewhere classified: Secondary | ICD-10-CM | POA: Diagnosis present

## 2020-03-03 DIAGNOSIS — R293 Abnormal posture: Secondary | ICD-10-CM | POA: Diagnosis present

## 2020-03-03 DIAGNOSIS — C50412 Malignant neoplasm of upper-outer quadrant of left female breast: Secondary | ICD-10-CM | POA: Insufficient documentation

## 2020-03-03 DIAGNOSIS — Z510 Encounter for antineoplastic radiation therapy: Secondary | ICD-10-CM | POA: Diagnosis not present

## 2020-03-03 NOTE — Therapy (Signed)
[place], [place], [address] Phone: [phone]   Fax:  [phone]  Physical Therapy Evaluation  Patient Details  Name: Hannah Wallace MRN: [id number] Date of Birth: 1956/10/07 Referring Provider (PT): Shona Simpson PA   Encounter Date: 03/03/2020  PT End of Session - 03/03/20 1501    Visit Number  1    Number of Visits  9    Date for PT Re-Evaluation  04/21/20    PT Start Time  1350    PT Stop Time  1455    PT Time Calculation (min)  65 min    Activity Tolerance  Patient tolerated treatment well    Behavior During Therapy  [facility] for tasks assessed/performed       Past Medical History:  Diagnosis Date  . Basal cell carcinoma    on back  . Cancer [facility])    breast cancer    Past Surgical History:  Procedure Laterality Date  . BREAST LUMPECTOMY WITH RADIOACTIVE SEED AND SENTINEL LYMPH NODE BIOPSY Left 09/25/2019   Procedure: LEFT BREAST LUMPECTOMY WITH RADIOACTIVE SEED AND SENTINEL LYMPH NODE BIOPSY;  Surgeon: Stark Klein, MD;  Location: [place];  Service: General;  Laterality: Left;  . COLONOSCOPY    . PORTACATH PLACEMENT Left 11/19/2019   Procedure: INSERTION PORT-A-CATH;  Surgeon: Stark Klein, MD;  Location: [place];  Service: General;  Laterality: Left;    There were no vitals filed for this visit.   Subjective Assessment - 03/03/20 1414    Subjective  Pt states that she still feels better after physical therapy following surgery but she has been doing radiation and has noticed pain i her axilla. After her last chemotherapy treatment she started to notice more pain in her R axilla and feels like she is guarding her arm. She states that she is afraid to pick up grocery bags and has noticed her L arm and L breast feeling more sore as time passes and she continues with radiation.    Pertinent History  L breast cancer with radiation and chemotherapy. She is done with  chemotherapy and currently has 1 more week of radiation.    Patient Stated Goals  I want to be able to bear weight through my arm and not be afraid and to decrease the pain.    Currently in Pain?  Yes    Pain Score  3     Pain Location  Axilla    Pain Orientation  Left    Pain Descriptors / Indicators  Aching    Pain Type  Surgical pain;Chronic pain    Pain Onset  More than a month ago    Pain Frequency  Intermittent    Aggravating Factors   Unknown    Pain Relieving Factors  unknown    Effect of Pain on Daily Activities  Pt is able to perform all of her activities like she wants to but she can feel pain in her arm. She is limiting her self due to she is afraid to use her L arm.         [facility] PT Assessment - 03/03/20 0001      Assessment   Medical Diagnosis  L breast cancer     Referring Provider (PT)  Shona Simpson PA    Hand Dominance  Right    Next MD Visit  03/04/2020    Prior Therapy  Yes before chemotherapy and radiation      Precautions  Precautions  Other (comment)    Precaution Comments  risk for lymphedema, breast cancer       Balance Screen   Has the patient fallen in the past 6 months  No    Has the patient had a decrease in activity level because of a fear of falling?   No    Is the patient reluctant to leave their home because of a fear of falling?   No      Home Environment   Living Environment  Private residence    Living Arrangements  Alone      Prior Function   Level of Independence  Independent      Cognition   Overall Cognitive Status  Within Functional Limits for tasks assessed      Posture/Postural Control   Posture/Postural Control  Postural limitations    Postural Limitations  Rounded Shoulders;Forward head      ROM / Strength   AROM / PROM / Strength  AROM      AROM   AROM Assessment Site  Shoulder    Right/Left Shoulder  Right;Left    Left Shoulder Flexion  136 Degrees    Left Shoulder ABduction  123 Degrees         LYMPHEDEMA/ONCOLOGY QUESTIONNAIRE - 03/03/20 1400      Type   Cancer Type  Left breast cancer       Surgeries   Lumpectomy Date  09/25/19    Sentinel Lymph Node Biopsy Date  09/25/19    Number Lymph Nodes Removed  5      Treatment   Active Chemotherapy Treatment  No    Past Chemotherapy Treatment  Yes    Date  01/22/20    Active Radiation Treatment  Yes    Date  03/03/20    Body Site  L breast/axilla    Past Radiation Treatment  No    Current Hormone Treatment  No    Past Hormone Therapy  No      What other symptoms do you have   Are you Having Heaviness or Tightness  No    Are you having Pain  Yes    Are you having pitting edema  No    Is it Hard or Difficult finding clothes that fit  No    Do you have infections  No    Is there Decreased scar mobility  Yes      Lymphedema Assessments   Lymphedema Assessments  Upper extremities      Right Upper Extremity Lymphedema   15 cm Proximal to Olecranon Process  33 cm    10 cm Proximal to Olecranon Process  31 cm    Olecranon Process  26.9 cm    15 cm Proximal to Ulnar Styloid Process  27 cm    10 cm Proximal to Ulnar Styloid Process  24.4 cm    Just Proximal to Ulnar Styloid Process  17.5 cm    Across Hand at [employer]  19.5 cm    At [place] of 2nd Digit  6.4 cm      Left Upper Extremity Lymphedema   15 cm Proximal to Olecranon Process  34 cm    10 cm Proximal to Olecranon Process  32 cm    Olecranon Process  28 cm    15 cm Proximal to Ulnar Styloid Process  26.9 cm    10 cm Proximal to Ulnar Styloid Process  23.7 cm    Just Proximal to  Ulnar Styloid Process  17.5 cm    Across Hand at [employer]  19 cm    At [place] of 2nd Digit  6.2 cm    Other  axillary line 105.3 at end exhalation    Other  at nipple line 106.5 at end of exhalation.              Outpatient Rehab from 03/03/2020 [facility]  Lymphedema Life Impact Scale Total Score  13.24 %      Objective  measurements completed on examination: See above findings.      [facility] Adult PT Treatment/Exercise - 03/03/20 0001      Manual Therapy   Manual Therapy  Soft tissue mobilization;Manual Lymphatic Drainage (MLD);Joint mobilization;Myofascial release    Joint Mobilization  Acromioclavicular joint due to reports of impingment into abduction.     Soft tissue mobilization  STM to the L middle deltoid an biceps due to tightness/tenderness noted that helped decrease reports of pain with abduction.     Myofascial Release  Myofascial release to the L axilla longitudinally and cross hand into the L medial brachium no pops noted but pt did have improved mobility by the end of session into flexion/abudction with decreased pain.     Manual Lymphatic Drainage (MLD)  In supine: following myofascial release and STM short neck, swimming in the terminus, bil shoulder collectors, bil axillary and L inguinal nodes, L axillo-inguinal anastomosis, anterior inter-axillary anastomosis, L lateral brachium, L medial to lateral brachium, L lateral brachium, all surfaces of the antebrachium, re-worked all surfaces and deep abdominals.              PT Education - 03/03/20 1422    Education Details  Access Code: [email or web address], pt was educated on use of compression sleeve for possible lymphedema and to help with cording in the LUE. Discussed fascial planes and tightness and how this is addressed as well as the importance of L shoulder/scapular stabilization and mobility to help decrease guarding. Discussed lymphedema vs. post op edema and edema related to radiation including that edema at this point could be one or all three and it can be different to differentiate but will be dreated with compression, manual lymph drainage and exercise regardless.    Person(s) Educated  Patient    Methods  Explanation;Handout;Demonstration    Comprehension  Verbalized understanding       PT Short Term Goals - 03/03/20 1644      PT SHORT  TERM GOAL #1   Title  Pt will be independent with home HEP and MLD within 2 weeks in order to demonstrate autonomy of care.    Baseline  pt currently is not performing MLD or her HEP    Time  4    Period  Weeks    Status  New    Target Date  04/21/20        PT Long Term Goals - 03/03/20 1644      PT LONG TERM GOAL #1   Title  Pt will demonstrate 150 degrees L shoulder flexion and ROM within 4 weeks in order to demonstrate functional improvements in L shoulder ROM ROM.    Baseline  L shoulder abduction 123: flexion: 136    Time  4    Period  Weeks    Status  New    Target Date  04/21/20      PT LONG TERM GOAL #2   Title  Pt will report 1/10 or  less pain in the L shoulder to demonstrate a subjective improvement in quality of life with use of her LUE.    Baseline  3/10 pain    Time  4    Period  Weeks    Status  New    Target Date  04/21/20      PT LONG TERM GOAL #3   Title  Pt will demonstrate 1 cm decrease in circumferential measurements of the L brachium within 4 weeks to demonstrate decreased fluid.    Baseline  see measurements.    Time  4    Period  Weeks    Status  New    Target Date  04/21/20      PT LONG TERM GOAL #4   Title  Pt will be fitted with appropriate compression garment within 4 weeks for home management of lymphedema.    Baseline  pt currently has a compression bra    Time  4    Period  Weeks    Status  New    Target Date  04/21/20             Plan - 03/03/20 1501    Clinical Impression Statement  Pt presents to physical therapy with increased circumferential measurements of her L brachium compared to her R brachium. She has cording noted in her L axilla down to her L anterior antebrachium that increases with abduciton and supination. Pt demonstrates stiffness in her L acromioclavicular joint that improved with grade III mobs and decreased reports of impingement with abduction. Myofascial release was performed over the medial L UE and slightly  into the L lateral trunk with improved mobilty and more comparable ROM compared to the RUE following release. Pt demonstrates postural deficits and is reporting decreased use of her LUE due to guarding for fear of injuring herself. Palpable tightness/tenderness noted at the L middle deltoid; decreased moderately following light STM. MLD in order to facilitate fluid flow was performed following manual therapy discussed pressure with patient when she performs at home. Pt will benefit from skilled physical therapy services in order to address the above mentioned limitations and decrease risk for immobiltiy and lymphedema.    Personal Factors and Comorbidities  Comorbidity 2    Comorbidities  Lumpectomy with 5 lymph node removal and radiation on the L    Stability/Clinical Decision Making  Stable/Uncomplicated    Clinical Decision Making  Low    Rehab Potential  Good    PT Frequency  2x / week    PT Duration  4 weeks    PT Treatment/Interventions  Moist Heat;Therapeutic exercise;Neuromuscular re-education;Manual techniques;Therapeutic activities    PT Next Visit Plan  myofascial release, scapular/shoulder stabilization exercises, did she get compression sleeve?    PT Home Exercise Plan  see pt instructions    Recommended Other Services  compression sleeve, pt was provided with script for compression sleeve.    Consulted and Agree with Plan of Care  Patient       Patient will benefit from skilled therapeutic intervention in order to improve the following deficits and impairments:  Increased edema, Postural dysfunction, Decreased range of motion, Pain, Decreased knowledge of precautions  Visit Diagnosis: Malignant neoplasm of upper-outer quadrant of left breast in female, estrogen receptor positive ([facility])  Abnormal posture  Stiffness of left shoulder, not elsewhere classified  Chronic left shoulder pain  Lymphedema, not elsewhere classified     Problem List Patient Active Problem List    Diagnosis Date Noted  .  Port-A-Cath in place 11/20/2019  . Malignant neoplasm of upper-outer quadrant of left breast in female, estrogen receptor positive ([place]) 09/09/2019    Ander Purpura, PT 03/03/2020, 4:48 PM  [place], [place], [address] Phone: [phone]   Fax:  [phone]  Name: Hannah Wallace MRN: [id number] Date of Birth: [birth year]

## 2020-03-03 NOTE — Patient Instructions (Signed)
Access Code: [email or web address] URL: [email or web address] Date: 03/03/2020 Prepared by: Tomma Rakers  Exercises Shoulder Flexion Wall Slide with Towel - 1 x daily - 7 x weekly - 1 sets - 10 reps - 5 seconds hold Standing Shoulder Abduction Finger Walk at Wall - 1 x daily - 7 x weekly - 1 sets - 10 reps - 5 seconds hold

## 2020-03-04 ENCOUNTER — Other Ambulatory Visit: Payer: Self-pay

## 2020-03-04 ENCOUNTER — Ambulatory Visit
Admission: RE | Admit: 2020-03-04 | Discharge: 2020-03-04 | Disposition: A | Discharge status: Home / Self Care | Payer: BC Managed Care – PPO | Source: Ambulatory Visit | Attending: Radiation Oncology | Admitting: Radiation Oncology

## 2020-03-04 DIAGNOSIS — Z510 Encounter for antineoplastic radiation therapy: Secondary | ICD-10-CM | POA: Diagnosis not present

## 2020-03-05 ENCOUNTER — Encounter (HOSPITAL_BASED_OUTPATIENT_CLINIC_OR_DEPARTMENT_OTHER): Payer: Self-pay | Admitting: General Surgery

## 2020-03-05 ENCOUNTER — Ambulatory Visit: Payer: BC Managed Care – PPO | Admitting: Radiation Oncology

## 2020-03-05 ENCOUNTER — Other Ambulatory Visit: Payer: Self-pay

## 2020-03-05 ENCOUNTER — Ambulatory Visit
Admission: RE | Admit: 2020-03-05 | Discharge: 2020-03-05 | Disposition: A | Discharge status: Home / Self Care | Payer: BC Managed Care – PPO | Source: Ambulatory Visit | Attending: Radiation Oncology | Admitting: Radiation Oncology

## 2020-03-05 DIAGNOSIS — Z510 Encounter for antineoplastic radiation therapy: Secondary | ICD-10-CM | POA: Diagnosis not present

## 2020-03-08 ENCOUNTER — Ambulatory Visit
Admission: RE | Admit: 2020-03-08 | Discharge: 2020-03-08 | Disposition: A | Discharge status: Home / Self Care | Payer: BC Managed Care – PPO | Source: Ambulatory Visit | Attending: Radiation Oncology | Admitting: Radiation Oncology

## 2020-03-08 ENCOUNTER — Other Ambulatory Visit: Payer: Self-pay

## 2020-03-08 DIAGNOSIS — Z510 Encounter for antineoplastic radiation therapy: Secondary | ICD-10-CM | POA: Diagnosis present

## 2020-03-08 DIAGNOSIS — Z170 Estrogen receptor positive status [ER+]: Secondary | ICD-10-CM | POA: Diagnosis present

## 2020-03-08 DIAGNOSIS — C50412 Malignant neoplasm of upper-outer quadrant of left female breast: Secondary | ICD-10-CM | POA: Diagnosis present

## 2020-03-08 NOTE — Progress Notes (Signed)
Patient Care Team: Joella Prince as PCP - General (Physician Assistant) Mauro Kaufmann, RN as Oncology Nurse Navigator Rockwell Germany, RN as Oncology Nurse Navigator  DIAGNOSIS:    ICD-10-CM   1. Malignant neoplasm of upper-outer quadrant of left breast in female, estrogen receptor positive ([place])  C50.412    Z17.0     SUMMARY OF ONCOLOGIC HISTORY: Oncology History  Malignant neoplasm of upper-outer quadrant of left breast in female, estrogen receptor positive ([place])  09/03/2019 Initial Diagnosis   Routine screening mammogram detected a 1.4cm indeterminate left breast mass. US showed a 1.0cm left breast mass and a 0.5cm lymph node with cortical thickening at the 2:00 position in the left breast. 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   09/10/2019 Cancer Staging   Staging form: Breast, AJCC 8th Edition - Clinical stage from 09/10/2019: Stage IA (cT1c, cN0, cM0, G2, ER+, PR+, HER2-) - Signed by Nicholas Lose, MD on 09/10/2019   09/25/2019 Surgery   Left lumpectomy [facility]): IDC, grade 2, 1.2cm, with intermediate grade DCIS, 5 axillary lymph nodes negative, clear margins.    09/25/2019 Cancer Staging   Staging form: Breast, AJCC 8th Edition - Pathologic stage from 09/25/2019: Stage IA (pT1c, pN0, cM0, G2, ER+, PR+, HER2-) - Signed by Gardenia Phlegm, NP on 10/15/2019   11/03/2019 Oncotype testing   Oncotype score 26   11/20/2019 -  Chemotherapy   The patient had palonosetron (ALOXI) injection 0.25 mg, 0.25 mg, Intravenous,  Once, 4 of 4 cycles Administration: 0.25 mg (11/20/2019), 0.25 mg (12/11/2019), 0.25 mg (01/01/2020), 0.25 mg (01/22/2020) pegfilgrastim-jmdb (FULPHILA) injection 6 mg, 6 mg, Subcutaneous,  Once, 4 of 4 cycles Administration: 6 mg (11/22/2019), 6 mg (12/13/2019), 6 mg (01/03/2020), 6 mg (01/24/2020) cyclophosphamide (CYTOXAN) 1,260 mg in sodium chloride 0.9 % 250 mL chemo infusion, 600  mg/m2 = 1,260 mg, Intravenous,  Once, 4 of 4 cycles Administration: 1,260 mg (11/20/2019), 1,260 mg (12/11/2019), 1,260 mg (01/01/2020), 1,260 mg (01/22/2020) DOCEtaxel (TAXOTERE) 160 mg in sodium chloride 0.9 % 250 mL chemo infusion, 75 mg/m2 = 160 mg, Intravenous,  Once, 4 of 4 cycles Administration: 160 mg (11/20/2019), 160 mg (12/11/2019), 160 mg (01/01/2020), 160 mg (01/22/2020)  for chemotherapy treatment.    02/17/2020 -  Radiation Therapy   Adjuvant radiation     CHIEF COMPLIANT: Follow-up to discuss antiestrogen therapy.   INTERVAL HISTORY: Hannah Wallace is a 64 y.o. with above-mentioned history of left breast cancerwhounderwent a lumpectomy, adjuvant chemotherapy, and is currently on radiation treatment. She presents to the clinic today to discuss antiestrogen therapy.   ALLERGIES:  has No Known Allergies.  MEDICATIONS:  Current Outpatient Medications  Medication Sig Dispense Refill  . b complex-C-folic acid 1 MG capsule Take 1 capsule by mouth daily.    . Omega-3 Fatty Acids (FISH OIL) 1000 MG CAPS Take 1,000 mg by mouth 2 (two) times daily.    [name] Glycol-Propyl Glycol (SYSTANE) 0.4-0.3 % SOLN Place 1 drop into both eyes daily.    . Vitamin D-Vitamin K (VITAMIN K2-VITAMIN D3 PO) Take 1 tablet by mouth daily. D3 125 mcg K2 90 mcg    . vitamin E 180 MG (400 UNITS) capsule Take 400 Units by mouth daily.     No current facility-administered medications for this visit.    PHYSICAL EXAMINATION: ECOG PERFORMANCE STATUS: 1 - Symptomatic but completely ambulatory  Vitals:   03/09/20 8144  BP: 127/68  Pulse: 91  Resp: 18  Temp: 98.2 F (36.8 C)  SpO2: 99%   Filed Weights   03/09/20 0823  Weight: 195 lb 8 oz (88.7 kg)    LABORATORY DATA:  I have reviewed the data as listed CMP Latest Ref Rng & Units 01/22/2020 01/01/2020 12/11/2019  Glucose 70 - 99 mg/dL 91 90 81  BUN 8 - 23 mg/dL '20 21 23  '$ Creatinine 0.44 - 1.00 mg/dL 0.68 0.78 0.94  Sodium 135 - 145 mmol/L 139 139  138  Potassium 3.5 - 5.1 mmol/L 3.9 4.3 3.8  Chloride 98 - 111 mmol/L 109 106 103  CO2 22 - 32 mmol/L 21(L) 24 24  Calcium 8.9 - 10.3 mg/dL 8.8(L) 9.5 9.1  Total Protein 6.5 - 8.1 g/dL 6.7 7.2 7.0  Total Bilirubin 0.3 - 1.2 mg/dL 0.3 0.4 1.0  Alkaline Phos 38 - 126 U/L 81 86 59  AST 15 - 41 U/L '15 16 16  '$ ALT 0 - 44 U/L '9 13 18    '$ Lab Results  Component Value Date   WBC 12.3 (H) 01/22/2020   HGB 12.4 01/22/2020   HCT 36.8 01/22/2020   MCV 90.4 01/22/2020   PLT 290 01/22/2020   NEUTROABS 9.6 (H) 01/22/2020    ASSESSMENT & PLAN:  Malignant neoplasm of upper-outer quadrant of left breast in female, estrogen receptor positive ([place]) 09/25/2019:Left lumpectomy ([place]): IDC, grade 2, 1.2cm, with intermediate grade DCIS, 5 axillary lymph nodes negative, clear margins.HER-2 - by FISH, ER+ 90%, PR+ 100%, Ki67 2%, Oncotype DX score 26  Treatment plan: 1.Adjuvant chemotherapy with Taxotere and Cytoxan every 3 weeks x 4 completed 01/22/20 2.Adjuvant radiation therapy 02/17/2020- 03/12/20 3.Adjuvant antiestrogen therapy will be starting April 06, 2020 ------------------------------------------------------------------------------------------------------------------------------------------------------------------- Anastrozole Counseling: We discussed the risks and benefits of anti-estrogen therapy with aromatase inhibitors. These include but not limited to insomnia, hot flashes, mood changes, vaginal dryness, bone density loss, and weight gain. We strongly believe that the benefits far outweigh the risks. Patient understands these risks and consented to starting treatment. Planned treatment duration is 7 years.  Patient agreed to participate in antiestrogen therapy compliance and adherence study. Return to clinic in 3 months for survivorship care plan visit     No orders of the defined types were placed in this encounter.  The patient has a good understanding of the overall plan. she  agrees with it. she will call with any problems that may develop before the next visit here.  Total time spent: 30 mins including face to face time and time spent for planning, charting and coordination of care  Nicholas Lose, MD 03/09/2020  I, Cloyde Reams Dorshimer, am acting as scribe for Dr. Nicholas Lose.  I have reviewed the above documentation for accuracy and completeness, and I agree with the above.

## 2020-03-09 ENCOUNTER — Other Ambulatory Visit: Payer: Self-pay

## 2020-03-09 ENCOUNTER — Ambulatory Visit
Admission: RE | Admit: 2020-03-09 | Discharge: 2020-03-09 | Disposition: A | Discharge status: Home / Self Care | Payer: BC Managed Care – PPO | Source: Ambulatory Visit | Attending: Radiation Oncology | Admitting: Radiation Oncology

## 2020-03-09 ENCOUNTER — Encounter (INDEPENDENT_AMBULATORY_CARE_PROVIDER_SITE_OTHER): Payer: Self-pay

## 2020-03-09 ENCOUNTER — Inpatient Hospital Stay: Payer: BC Managed Care – PPO | Attending: Hematology and Oncology | Admitting: Hematology and Oncology

## 2020-03-09 DIAGNOSIS — Z79811 Long term (current) use of aromatase inhibitors: Secondary | ICD-10-CM | POA: Insufficient documentation

## 2020-03-09 DIAGNOSIS — C50412 Malignant neoplasm of upper-outer quadrant of left female breast: Secondary | ICD-10-CM

## 2020-03-09 DIAGNOSIS — Z170 Estrogen receptor positive status [ER+]: Secondary | ICD-10-CM

## 2020-03-09 DIAGNOSIS — Z923 Personal history of irradiation: Secondary | ICD-10-CM | POA: Insufficient documentation

## 2020-03-09 MED ORDER — ANASTROZOLE 1 MG PO TABS
1.0000 mg | ORAL_TABLET | Freq: Every day | ORAL | 3 refills | Status: DC
Start: 2020-04-06 — End: 2021-03-23

## 2020-03-09 NOTE — Assessment & Plan Note (Signed)
09/25/2019:Left lumpectomy [facility]): IDC, grade 2, 1.2cm, with intermediate grade DCIS, 5 axillary lymph nodes negative, clear margins.HER-2 - by FISH, ER+ 90%, PR+ 100%, Ki67 2%, Oncotype DX score 26  Treatment plan: 1.Adjuvant chemotherapy with Taxotere and Cytoxan every 3 weeks x 4 completed 01/22/20 2.Adjuvant radiation therapy 02/17/2020- 03/12/20 3.Adjuvant antiestrogen therapy ------------------------------------------------------------------------------------------------------------------------------------------------------------------- Anastrozole Counseling: We discussed the risks and benefits of anti-estrogen therapy with aromatase inhibitors. These include but not limited to insomnia, hot flashes, mood changes, vaginal dryness, bone density loss, and weight gain. We strongly believe that the benefits far outweigh the risks. Patient understands these risks and consented to starting treatment. Planned treatment duration is 7 years.  Patient agreed to participate in antiestrogen therapy compliance and adherence study. Return to clinic in 3 months for survivorship care plan visit

## 2020-03-10 ENCOUNTER — Other Ambulatory Visit: Payer: Self-pay

## 2020-03-10 ENCOUNTER — Ambulatory Visit
Admission: RE | Admit: 2020-03-10 | Discharge: 2020-03-10 | Disposition: A | Discharge status: Home / Self Care | Payer: BC Managed Care – PPO | Source: Ambulatory Visit | Attending: Radiation Oncology | Admitting: Radiation Oncology

## 2020-03-10 DIAGNOSIS — C50412 Malignant neoplasm of upper-outer quadrant of left female breast: Secondary | ICD-10-CM | POA: Diagnosis not present

## 2020-03-11 ENCOUNTER — Ambulatory Visit
Admission: RE | Admit: 2020-03-11 | Discharge: 2020-03-11 | Disposition: A | Discharge status: Home / Self Care | Payer: BC Managed Care – PPO | Source: Ambulatory Visit | Attending: Radiation Oncology | Admitting: Radiation Oncology

## 2020-03-11 ENCOUNTER — Encounter: Payer: Self-pay | Admitting: *Deleted

## 2020-03-11 ENCOUNTER — Other Ambulatory Visit: Payer: Self-pay

## 2020-03-11 DIAGNOSIS — C50412 Malignant neoplasm of upper-outer quadrant of left female breast: Secondary | ICD-10-CM | POA: Diagnosis not present

## 2020-03-11 NOTE — Progress Notes (Signed)

## 2020-03-12 ENCOUNTER — Encounter: Payer: Self-pay | Admitting: Radiation Oncology

## 2020-03-12 ENCOUNTER — Ambulatory Visit
Admission: RE | Admit: 2020-03-12 | Discharge: 2020-03-12 | Disposition: A | Discharge status: Home / Self Care | Payer: BC Managed Care – PPO | Source: Ambulatory Visit | Attending: Radiation Oncology | Admitting: Radiation Oncology

## 2020-03-12 ENCOUNTER — Other Ambulatory Visit: Payer: Self-pay

## 2020-03-12 ENCOUNTER — Other Ambulatory Visit (HOSPITAL_COMMUNITY)
Admission: RE | Admit: 2020-03-12 | Discharge: 2020-03-12 | Disposition: A | Discharge status: Home / Self Care | Payer: BC Managed Care – PPO | Source: Ambulatory Visit | Attending: General Surgery | Admitting: General Surgery

## 2020-03-12 DIAGNOSIS — Z20822 Contact with and (suspected) exposure to covid-19: Secondary | ICD-10-CM | POA: Diagnosis not present

## 2020-03-12 DIAGNOSIS — C50412 Malignant neoplasm of upper-outer quadrant of left female breast: Secondary | ICD-10-CM

## 2020-03-12 DIAGNOSIS — Z01812 Encounter for preprocedural laboratory examination: Secondary | ICD-10-CM | POA: Insufficient documentation

## 2020-03-12 DIAGNOSIS — Z170 Estrogen receptor positive status [ER+]: Secondary | ICD-10-CM

## 2020-03-12 LAB — SARS CORONAVIRUS 2 (TAT 6-24 HRS): SARS Coronavirus 2: NEGATIVE

## 2020-03-12 MED ORDER — SONAFINE EX EMUL
1.0000 "application " | Freq: Once | CUTANEOUS | Status: AC
  Administered 2020-03-12: 1 via TOPICAL

## 2020-03-16 ENCOUNTER — Ambulatory Visit (HOSPITAL_BASED_OUTPATIENT_CLINIC_OR_DEPARTMENT_OTHER): Payer: BC Managed Care – PPO | Admitting: Anesthesiology

## 2020-03-16 ENCOUNTER — Encounter (HOSPITAL_BASED_OUTPATIENT_CLINIC_OR_DEPARTMENT_OTHER)
Admission: RE | Disposition: A | Discharge status: Home / Self Care | Payer: Self-pay | Source: Home / Self Care | Attending: General Surgery

## 2020-03-16 ENCOUNTER — Other Ambulatory Visit: Payer: Self-pay

## 2020-03-16 ENCOUNTER — Encounter (HOSPITAL_BASED_OUTPATIENT_CLINIC_OR_DEPARTMENT_OTHER): Payer: Self-pay | Admitting: General Surgery

## 2020-03-16 ENCOUNTER — Ambulatory Visit (HOSPITAL_BASED_OUTPATIENT_CLINIC_OR_DEPARTMENT_OTHER)
Admission: RE | Admit: 2020-03-16 | Discharge: 2020-03-16 | Disposition: A | Discharge status: Home / Self Care | Payer: BC Managed Care – PPO | Attending: General Surgery | Admitting: General Surgery

## 2020-03-16 DIAGNOSIS — Z801 Family history of malignant neoplasm of trachea, bronchus and lung: Secondary | ICD-10-CM | POA: Insufficient documentation

## 2020-03-16 DIAGNOSIS — Z85828 Personal history of other malignant neoplasm of skin: Secondary | ICD-10-CM | POA: Diagnosis not present

## 2020-03-16 DIAGNOSIS — Z452 Encounter for adjustment and management of vascular access device: Secondary | ICD-10-CM | POA: Insufficient documentation

## 2020-03-16 DIAGNOSIS — Z803 Family history of malignant neoplasm of breast: Secondary | ICD-10-CM | POA: Insufficient documentation

## 2020-03-16 DIAGNOSIS — C50912 Malignant neoplasm of unspecified site of left female breast: Secondary | ICD-10-CM | POA: Insufficient documentation

## 2020-03-16 HISTORY — PX: PORT-A-CATH REMOVAL: SHX5289

## 2020-03-16 SURGERY — REMOVAL PORT-A-CATH
Anesthesia: Monitor Anesthesia Care | Site: Chest

## 2020-03-16 MED ORDER — OXYCODONE HCL 5 MG PO TABS: 5.0000 mg | ORAL_TABLET | Freq: Once | ORAL | Status: DC | PRN

## 2020-03-16 MED ORDER — PHENYLEPHRINE 40 MCG/ML (10ML) SYRINGE FOR IV PUSH (FOR BLOOD PRESSURE SUPPORT)
PREFILLED_SYRINGE | INTRAVENOUS | Status: AC
  Filled 2020-03-16: qty 10

## 2020-03-16 MED ORDER — PROPOFOL 10 MG/ML IV BOLUS
INTRAVENOUS | Status: AC
  Filled 2020-03-16: qty 20

## 2020-03-16 MED ORDER — MIDAZOLAM HCL 2 MG/2ML IJ SOLN: 1.0000 mg | INTRAMUSCULAR | Status: DC | PRN

## 2020-03-16 MED ORDER — CEFAZOLIN SODIUM-DEXTROSE 2-4 GM/100ML-% IV SOLN
2.0000 g | INTRAVENOUS | Status: AC
  Administered 2020-03-16: 2 g via INTRAVENOUS

## 2020-03-16 MED ORDER — ONDANSETRON HCL 4 MG/2ML IJ SOLN
INTRAMUSCULAR | Status: DC | PRN
  Administered 2020-03-16: 4 mg via INTRAVENOUS

## 2020-03-16 MED ORDER — FENTANYL CITRATE (PF) 100 MCG/2ML IJ SOLN
INTRAMUSCULAR | Status: AC
  Filled 2020-03-16: qty 2

## 2020-03-16 MED ORDER — PHENYLEPHRINE 40 MCG/ML (10ML) SYRINGE FOR IV PUSH (FOR BLOOD PRESSURE SUPPORT)
PREFILLED_SYRINGE | INTRAVENOUS | Status: DC | PRN
  Administered 2020-03-16: 80 ug via INTRAVENOUS

## 2020-03-16 MED ORDER — BUPIVACAINE HCL (PF) 0.25 % IJ SOLN
INTRAMUSCULAR | Status: DC | PRN
  Administered 2020-03-16: 9 mL

## 2020-03-16 MED ORDER — CEFAZOLIN SODIUM-DEXTROSE 2-4 GM/100ML-% IV SOLN
INTRAVENOUS | Status: AC
  Filled 2020-03-16: qty 100

## 2020-03-16 MED ORDER — ACETAMINOPHEN 500 MG PO TABS: 1000.0000 mg | ORAL_TABLET | ORAL | Status: DC

## 2020-03-16 MED ORDER — LIDOCAINE-EPINEPHRINE 1 %-1:100000 IJ SOLN
INTRAMUSCULAR | Status: AC
  Filled 2020-03-16: qty 1

## 2020-03-16 MED ORDER — ONDANSETRON HCL 4 MG/2ML IJ SOLN: 4.0000 mg | Freq: Once | INTRAMUSCULAR | Status: DC | PRN

## 2020-03-16 MED ORDER — OXYCODONE HCL 5 MG/5ML PO SOLN: 5.0000 mg | Freq: Once | ORAL | Status: DC | PRN

## 2020-03-16 MED ORDER — MIDAZOLAM HCL 5 MG/5ML IJ SOLN
INTRAMUSCULAR | Status: DC | PRN
  Administered 2020-03-16: 2 mg via INTRAVENOUS

## 2020-03-16 MED ORDER — CHLORHEXIDINE GLUCONATE CLOTH 2 % EX PADS: 6.0000 | MEDICATED_PAD | Freq: Once | CUTANEOUS | Status: DC

## 2020-03-16 MED ORDER — LACTATED RINGERS IV SOLN: INTRAVENOUS | Status: DC

## 2020-03-16 MED ORDER — PROPOFOL 500 MG/50ML IV EMUL
INTRAVENOUS | Status: DC | PRN
  Administered 2020-03-16: 75 ug/kg/min via INTRAVENOUS

## 2020-03-16 MED ORDER — LIDOCAINE-EPINEPHRINE 2 %-1:100000 IJ SOLN
INTRAMUSCULAR | Status: AC
  Filled 2020-03-16: qty 1

## 2020-03-16 MED ORDER — ACETAMINOPHEN 500 MG PO TABS
ORAL_TABLET | ORAL | Status: AC
  Filled 2020-03-16: qty 2

## 2020-03-16 MED ORDER — FENTANYL CITRATE (PF) 100 MCG/2ML IJ SOLN: 25.0000 ug | INTRAMUSCULAR | Status: DC | PRN

## 2020-03-16 MED ORDER — FENTANYL CITRATE (PF) 100 MCG/2ML IJ SOLN
INTRAMUSCULAR | Status: DC | PRN
  Administered 2020-03-16 (×2): 50 ug via INTRAVENOUS

## 2020-03-16 MED ORDER — LIDOCAINE-EPINEPHRINE (PF) 1 %-1:200000 IJ SOLN
INTRAMUSCULAR | Status: DC | PRN
  Administered 2020-03-16: 9 mL

## 2020-03-16 MED ORDER — ONDANSETRON HCL 4 MG/2ML IJ SOLN
INTRAMUSCULAR | Status: AC
  Filled 2020-03-16: qty 2

## 2020-03-16 MED ORDER — PROPOFOL 10 MG/ML IV BOLUS
INTRAVENOUS | Status: DC | PRN
  Administered 2020-03-16: 20 mg via INTRAVENOUS

## 2020-03-16 MED ORDER — FENTANYL CITRATE (PF) 100 MCG/2ML IJ SOLN: 50.0000 ug | INTRAMUSCULAR | Status: DC | PRN

## 2020-03-16 MED ORDER — MIDAZOLAM HCL 2 MG/2ML IJ SOLN
INTRAMUSCULAR | Status: AC
  Filled 2020-03-16: qty 2

## 2020-03-16 SURGICAL SUPPLY — 35 items
BLADE HEX COATED 2.75 (ELECTRODE) ×2 IMPLANT
BLADE SURG 15 STRL LF DISP TIS (BLADE) ×1 IMPLANT
BLADE SURG 15 STRL SS (BLADE) ×1
CANISTER SUCT 1200ML W/VALVE (MISCELLANEOUS) IMPLANT
CHLORAPREP W/TINT 26 (MISCELLANEOUS) ×4 IMPLANT
COVER BACK TABLE 60X90IN (DRAPES) ×2 IMPLANT
COVER MAYO STAND STRL (DRAPES) ×2 IMPLANT
COVER WAND RF STERILE (DRAPES) IMPLANT
DECANTER SPIKE VIAL GLASS SM (MISCELLANEOUS) IMPLANT
DERMABOND ADVANCED (GAUZE/BANDAGES/DRESSINGS) ×1
DERMABOND ADVANCED .7 DNX12 (GAUZE/BANDAGES/DRESSINGS) ×1 IMPLANT
DRAPE LAPAROTOMY 100X72 PEDS (DRAPES) ×2 IMPLANT
DRAPE UTILITY XL STRL (DRAPES) ×2 IMPLANT
ELECT REM PT RETURN 9FT ADLT (ELECTROSURGICAL) ×2
ELECTRODE REM PT RTRN 9FT ADLT (ELECTROSURGICAL) ×1 IMPLANT
GLOVE BIO SURGEON STRL SZ 6 (GLOVE) ×2 IMPLANT
GLOVE BIO SURGEON STRL SZ7 (GLOVE) ×2 IMPLANT
GLOVE BIOGEL PI IND STRL 6.5 (GLOVE) ×1 IMPLANT
GLOVE BIOGEL PI IND STRL 7.0 (GLOVE) ×1 IMPLANT
GLOVE BIOGEL PI INDICATOR 6.5 (GLOVE) ×1
GLOVE BIOGEL PI INDICATOR 7.0 (GLOVE) ×1
GOWN STRL REUS W/ TWL LRG LVL3 (GOWN DISPOSABLE) ×1 IMPLANT
GOWN STRL REUS W/TWL 2XL LVL3 (GOWN DISPOSABLE) ×2 IMPLANT
GOWN STRL REUS W/TWL LRG LVL3 (GOWN DISPOSABLE) ×1
NEEDLE HYPO 25X1 1.5 SAFETY (NEEDLE) ×2 IMPLANT
NS IRRIG 1000ML POUR BTL (IV SOLUTION) IMPLANT
PENCIL SMOKE EVACUATOR (MISCELLANEOUS) ×2 IMPLANT
SET BASIN DAY SURGERY F.S. (CUSTOM PROCEDURE TRAY) ×2 IMPLANT
SUT MNCRL AB 4-0 PS2 18 (SUTURE) ×2 IMPLANT
SUT VIC AB 3-0 SH 27 (SUTURE) ×1
SUT VIC AB 3-0 SH 27X BRD (SUTURE) ×1 IMPLANT
SYR CONTROL 10ML LL (SYRINGE) ×2 IMPLANT
TOWEL GREEN STERILE FF (TOWEL DISPOSABLE) ×2 IMPLANT
TUBE CONNECTING 20X1/4 (TUBING) IMPLANT
YANKAUER SUCT BULB TIP NO VENT (SUCTIONS) IMPLANT

## 2020-03-16 NOTE — H&P (Signed)
Hannah Wallace is an 63 y.o. female.   Chief Complaint: left breast cancer, port in place HPI:  Pt is a 64 yo F s/p breast conservation surgery for left breast cancer.  Due to a high oncotype, she received chemotherapy via a port.  She presents for port removal.    Past Medical History:  Diagnosis Date  . Basal cell carcinoma    on back  . Cancer [facility])    breast cancer    Past Surgical History:  Procedure Laterality Date  . BREAST LUMPECTOMY WITH RADIOACTIVE SEED AND SENTINEL LYMPH NODE BIOPSY Left 09/25/2019   Procedure: LEFT BREAST LUMPECTOMY WITH RADIOACTIVE SEED AND SENTINEL LYMPH NODE BIOPSY;  Surgeon: Stark Klein, MD;  Location: [place];  Service: General;  Laterality: Left;  . COLONOSCOPY    . PORTACATH PLACEMENT Left 11/19/2019   Procedure: INSERTION PORT-A-CATH;  Surgeon: Stark Klein, MD;  Location: [place];  Service: General;  Laterality: Left;    Family History  Problem Relation Age of Onset  . Lung cancer Father   . Hodgkin's lymphoma Sister   . Breast cancer Paternal Grandmother    Social History:  reports that she has never smoked. She has never used smokeless tobacco. She reports current alcohol use. She reports that she does not use drugs.  Allergies: No Known Allergies  Medications Prior to Admission  Medication Sig Dispense Refill  . b complex-C-folic acid 1 MG capsule Take 1 capsule by mouth daily.    . Omega-3 Fatty Acids (FISH OIL) 1000 MG CAPS Take 1,000 mg by mouth 2 (two) times daily.    [name] Glycol-Propyl Glycol (SYSTANE) 0.4-0.3 % SOLN Place 1 drop into both eyes daily.    . Vitamin D-Vitamin K (VITAMIN K2-VITAMIN D3 PO) Take 1 tablet by mouth daily. D3 125 mcg K2 90 mcg    . vitamin E 180 MG (400 UNITS) capsule Take 400 Units by mouth daily.    Derrill Memo ON 04/06/2020] anastrozole (ARIMIDEX) 1 MG tablet Take 1 tablet (1 mg total) by mouth daily. 90 tablet 3    No results found for this or any previous visit (from the past 48  hour(s)). No results found.  Review of Systems  All other systems reviewed and are negative.   Blood pressure (!) 108/57, pulse 80, temperature (!) 97.5 F (36.4 C), temperature source Oral, resp. rate 16, height 5\' 9"  (1.753 m), weight 88.5 kg, SpO2 98 %. Physical Exam  Constitutional: She is oriented to person, place, and time. She appears well-developed and well-nourished.  HENT:  Head: Normocephalic and atraumatic.  Eyes: Pupils are equal, round, and reactive to light. Conjunctivae are normal.  Neck: No thyromegaly present.  Cardiovascular: Normal rate and regular rhythm.  Respiratory: Effort normal. No respiratory distress.  Port in place on left upper chest    Musculoskeletal:        General: No deformity or edema.     Cervical back: Normal range of motion.  Neurological: She is alert and oriented to person, place, and time.  Skin: Skin is warm and dry. No rash noted. No erythema. No pallor.  Psychiatric: She has a normal mood and affect. Her behavior is normal. Judgment and thought content normal.     Assessment/Plan Left breast cancer, s/p surgery, chemo and radiation.  Plan port removal Discussed with patient.   Stark Klein, MD 03/16/2020, 2:48 PM

## 2020-03-16 NOTE — Discharge Instructions (Addendum)
[employer] Office Phone Number [phone]   POST OP INSTRUCTIONS  Always review your discharge instruction sheet given to you by the facility where your surgery was performed.  IF YOU HAVE DISABILITY OR FAMILY LEAVE FORMS, YOU MUST BRING THEM TO THE OFFICE FOR PROCESSING.  DO NOT GIVE THEM TO YOUR DOCTOR.  1. A prescription for pain medication may be given to you upon discharge.  Take your pain medication as prescribed, if needed.  If narcotic pain medicine is not needed, then you may take acetaminophen (Tylenol) or ibuprofen (Advil) as needed. 2. Take your usually prescribed medications unless otherwise directed 3. If you need a refill on your pain medication, please contact your pharmacy.  They will contact our office to request authorization.  Prescriptions will not be filled after 5pm or on week-ends. 4. You should eat very light the first 24 hours after surgery, such as soup, crackers, pudding, etc.  Resume your normal diet the day after surgery 5. It is common to experience some constipation if taking pain medication after surgery.  Increasing fluid intake and taking a stool softener will usually help or prevent this problem from occurring.  A mild laxative (Milk of Magnesia or Miralax) should be taken according to package directions if there are no bowel movements after 48 hours. 6. You may shower in 48 hours.  The surgical glue will flake off in 2-3 weeks.   7. ACTIVITIES:  No strenuous activity or heavy lifting for 1 week.   a. You may drive when you no longer are taking prescription pain medication, you can comfortably wear a seatbelt, and you can safely maneuver your car and apply brakes. b. RETURN TO WORK:  __________as tolerated if no lifting for 1 week._______________ You should see your doctor in the office for a follow-up appointment approximately three-four weeks after your surgery.    WHEN TO CALL YOUR DOCTOR: 1. Fever over 101.0 2. Nausea and/or  vomiting. 3. Extreme swelling or bruising. 4. Continued bleeding from incision. 5. Increased pain, redness, or drainage from the incision.  The clinic staff is available to answer your questions during regular business hours.  Please don't hesitate to call and ask to speak to one of the nurses for clinical concerns.  If you have a medical emergency, go to the nearest emergency room or call 911.  A surgeon from [facility] is always on call at the hospital.  For further questions, please visit [email or web address]    NO TYLENOL PRODUCTS UNTIL 6:30 PM     Post Anesthesia Home Care Instructions  Activity: Get plenty of rest for the remainder of the day. A responsible individual must stay with you for 24 hours following the procedure.  For the next 24 hours, DO NOT: -Drive a car -Paediatric nurse -Drink alcoholic beverages -Take any medication unless instructed by your physician -Make any legal decisions or sign important papers.  Meals: Start with liquid foods such as gelatin or soup. Progress to regular foods as tolerated. Avoid greasy, spicy, heavy foods. If nausea and/or vomiting occur, drink only clear liquids until the nausea and/or vomiting subsides. Call your physician if vomiting continues.  Special Instructions/Symptoms: Your throat may feel dry or sore from the anesthesia or the breathing tube placed in your throat during surgery. If this causes discomfort, gargle with warm salt water. The discomfort should disappear within 24 hours.  If you had a scopolamine patch placed behind your ear for the management of post- operative nausea and/or vomiting:  1. The medication in the patch is effective for 72 hours, after which it should be removed.  Wrap patch in a tissue and discard in the trash. Wash hands thoroughly with soap and water. 2. You may remove the patch earlier than 72 hours if you experience unpleasant side effects which may include dry mouth,  dizziness or visual disturbances. 3. Avoid touching the patch. Wash your hands with soap and water after contact with the patch.

## 2020-03-16 NOTE — Anesthesia Preprocedure Evaluation (Signed)
Anesthesia Evaluation  Patient identified by MRN, date of birth, ID band Patient awake    Reviewed: Allergy & Precautions, NPO status , Patient's Chart, lab work & pertinent test results  History of Anesthesia Complications Negative for: history of anesthetic complications  Airway Mallampati: II  TM Distance: >3 FB Neck ROM: Full    Dental  (+) Teeth Intact   Pulmonary neg pulmonary ROS,    Pulmonary exam normal        Cardiovascular negative cardio ROS Normal cardiovascular exam     Neuro/Psych negative neurological ROS  negative psych ROS   GI/Hepatic negative GI ROS, Neg liver ROS,   Endo/Other  negative endocrine ROS  Renal/GU negative Renal ROS  negative genitourinary   Musculoskeletal negative musculoskeletal ROS (+)   Abdominal   Peds  Hematology negative hematology ROS (+)   Anesthesia Other Findings  Breast cancer s/p chemo/lumpectomy  Reproductive/Obstetrics                             Anesthesia Physical Anesthesia Plan  ASA: II  Anesthesia Plan: MAC   Post-op Pain Management:    Induction: Intravenous  PONV Risk Score and Plan: 2 and Propofol infusion, TIVA and Treatment may vary due to age or medical condition  Airway Management Planned: Natural Airway, Nasal Cannula and Simple Face Mask  Additional Equipment: None  Intra-op Plan:   Post-operative Plan:   Informed Consent: I have reviewed the patients History and Physical, chart, labs and discussed the procedure including the risks, benefits and alternatives for the proposed anesthesia with the patient or authorized representative who has indicated his/her understanding and acceptance.       Plan Discussed with:   Anesthesia Plan Comments:         Anesthesia Quick Evaluation

## 2020-03-16 NOTE — Anesthesia Postprocedure Evaluation (Signed)
Anesthesia Post Note  Patient: Hannah Wallace  Procedure(s) Performed: REMOVAL PORT-A-CATH (N/A Chest)     Patient location during evaluation: PACU Level of consciousness: awake and alert Pain management: pain level controlled Vital Signs Assessment: post-procedure vital signs reviewed and stable Respiratory status: spontaneous breathing, nonlabored ventilation and respiratory function stable Cardiovascular status: blood pressure returned to baseline and stable Postop Assessment: no apparent nausea or vomiting Anesthetic complications: no    Last Vitals:  Vitals:   03/16/20 1545 03/16/20 1606  BP: 107/62 110/77  Pulse: 67 63  Resp: 14 18  Temp:  (!) 36.4 C  SpO2: 97% 99%    Last Pain:  Vitals:   03/16/20 1606  TempSrc: Oral  PainSc: 0-No pain                 Lidia Collum

## 2020-03-16 NOTE — Op Note (Signed)
  PRE-OPERATIVE DIAGNOSIS:  un-needed Port-A-Cath for left breast cancer  POST-OPERATIVE DIAGNOSIS:  Same   PROCEDURE:  Procedure(s):  REMOVAL PORT-A-CATH  SURGEON:  Surgeon(s):  Stark Klein, MD  ANESTHESIA:   MAC + local  EBL:   Minimal  SPECIMEN:  None  Complications : none known  Procedure:   Pt was  identified in the holding area and taken to the operating room where she was placed supine on the operating room table.  MAC anesthesia was induced.  The left upper chest was prepped and draped.  The prior incision was anesthetized with local anesthetic.  The incision was opened with a #15 blade.  The subcutaneous tissue was divided with the cautery.  The port was identified and the capsule opened.  The four 2-0 prolene sutures were removed.  The port was then removed and pressure held on the tract.  The catheter appeared intact without evidence of breakage, length was 22 cm.  The wound was inspected for hemostasis, which was achieved with cautery.  The wound was closed with 3-0 vicryl deep dermal interrupted sutures and 4-0 Monocryl running subcuticular suture.  The wound was cleaned, dried, and dressed with dermabond.  The patient was awakened from anesthesia and taken to the PACU in stable condition.  Needle, sponge, and instrument counts are correct.

## 2020-03-16 NOTE — Transfer of Care (Signed)
Immediate Anesthesia Transfer of Care Note  Patient: Hannah Wallace  Procedure(s) Performed: REMOVAL PORT-A-CATH (N/A Chest)  Patient Location: PACU  Anesthesia Type:MAC  Level of Consciousness: awake, alert  and oriented  Airway & Oxygen Therapy: Patient Spontanous Breathing and Patient connected to face mask oxygen  Post-op Assessment: Report given to RN and Post -op Vital signs reviewed and stable  Post vital signs: Reviewed and stable  Last Vitals:  Vitals Value Taken Time  BP 104/51 03/16/20 1530  Temp    Pulse 73 03/16/20 1531  Resp 16 03/16/20 1531  SpO2 96 % 03/16/20 1531  Vitals shown include unvalidated device data.  Last Pain:  Vitals:   03/16/20 1240  TempSrc: Oral  PainSc: 0-No pain      Patients Stated Pain Goal: 3 (74/82/70 7867)  Complications: No apparent anesthesia complications

## 2020-03-17 ENCOUNTER — Encounter: Payer: Self-pay | Admitting: *Deleted

## 2020-03-22 ENCOUNTER — Other Ambulatory Visit: Payer: Self-pay

## 2020-03-22 ENCOUNTER — Ambulatory Visit: Payer: BC Managed Care – PPO | Attending: Radiation Oncology

## 2020-03-22 DIAGNOSIS — G8929 Other chronic pain: Secondary | ICD-10-CM | POA: Diagnosis present

## 2020-03-22 DIAGNOSIS — M25512 Pain in left shoulder: Secondary | ICD-10-CM | POA: Insufficient documentation

## 2020-03-22 DIAGNOSIS — R293 Abnormal posture: Secondary | ICD-10-CM

## 2020-03-22 DIAGNOSIS — R600 Localized edema: Secondary | ICD-10-CM | POA: Diagnosis present

## 2020-03-22 DIAGNOSIS — Z483 Aftercare following surgery for neoplasm: Secondary | ICD-10-CM

## 2020-03-22 DIAGNOSIS — Z170 Estrogen receptor positive status [ER+]: Secondary | ICD-10-CM | POA: Diagnosis present

## 2020-03-22 DIAGNOSIS — M25612 Stiffness of left shoulder, not elsewhere classified: Secondary | ICD-10-CM | POA: Insufficient documentation

## 2020-03-22 DIAGNOSIS — C50412 Malignant neoplasm of upper-outer quadrant of left female breast: Secondary | ICD-10-CM | POA: Insufficient documentation

## 2020-03-22 DIAGNOSIS — I890 Lymphedema, not elsewhere classified: Secondary | ICD-10-CM | POA: Diagnosis present

## 2020-03-22 NOTE — Therapy (Signed)
[place], [place], [address] Phone: [phone]   Fax:  [phone]  Physical Therapy Treatment  Patient Details  Name: Hannah Wallace MRN: [id number] Date of Birth: Jul 12, 1956 Referring Provider (PT): Shona Simpson PA   Encounter Date: 03/22/2020  PT End of Session - 03/22/20 1606    Visit Number  2    Number of Visits  9    Date for PT Re-Evaluation  04/21/20    PT Start Time  [id number]    PT Stop Time  1702    PT Time Calculation (min)  57 min    Activity Tolerance  Patient tolerated treatment well    Behavior During Therapy  [facility] for tasks assessed/performed       Past Medical History:  Diagnosis Date  . Basal cell carcinoma    on back  . Cancer [facility])    breast cancer    Past Surgical History:  Procedure Laterality Date  . BREAST LUMPECTOMY WITH RADIOACTIVE SEED AND SENTINEL LYMPH NODE BIOPSY Left 09/25/2019   Procedure: LEFT BREAST LUMPECTOMY WITH RADIOACTIVE SEED AND SENTINEL LYMPH NODE BIOPSY;  Surgeon: Stark Klein, MD;  Location: [place];  Service: General;  Laterality: Left;  . COLONOSCOPY    . PORT-A-CATH REMOVAL N/A 03/16/2020   Procedure: REMOVAL PORT-A-CATH;  Surgeon: Stark Klein, MD;  Location: [place];  Service: General;  Laterality: N/A;  . PORTACATH PLACEMENT Left 11/19/2019   Procedure: INSERTION PORT-A-CATH;  Surgeon: Stark Klein, MD;  Location: [place];  Service: General;  Laterality: Left;    There were no vitals filed for this visit.  Subjective Assessment - 03/22/20 1606    Subjective  Pt states that she was a little sore after her last session but felt better after a day or two. She states that last week she had a death in her family and finished radiation so she hasn't been able to do her exercises as consistently as she would like. She states that she has started to do more exercises and watch exercise videos. She states that she  feels more comfortable moving her arm. She states that she had some issues with her skin for the last week of radiation but it is healing now as well as had her port removed.    Pertinent History  L breast cancer with radiation and chemotherapy. She is done with chemotherapy and currently has 1 more week of radiation.    Patient Stated Goals  I want to be able to bear weight through my arm and not be afraid and to decrease the pain.    Currently in Pain?  Yes    Pain Score  1     Pain Location  Shoulder    Pain Orientation  Left;Anterior    Pain Descriptors / Indicators  Other (Comment)   itching where the port came out.                  Outpatient Rehab [facility]/03/2020 [facility]  Lymphedema Life Impact Scale Total Score  13.24 %           [facility] Adult PT Treatment/Exercise - 03/22/20 0001      Manual Therapy   Manual Therapy  Soft tissue mobilization;Manual Lymphatic Drainage (MLD);Joint mobilization;Myofascial release    Joint Mobilization  Acromioclavicular joint due to reports of impingment into prolonged end range abduction grade III posterior clavicle on acromion 8x8    Soft  tissue mobilization  STM to the L wrist flexors/extensors and lateral deltoid due to tightness/stiffness and trejectory of cording in the L posterior to anterior antebrachium; minimal improvement following light STM pt reports feeling less tightn.     Myofascial Release  Minimal myofascial release was performed this session from the antecubital fossa anteriorly on the L arm to the distal medial LUE only to prevent skin irritation over cording with no significant success.     Manual Lymphatic Drainage (MLD)  In supine: short neck, swimming in the terminus, bil shoulder collectors, bil axillary and L inguinal nodes, superior/inferior breast toward corresponding anastomosis then re-worked anastomosis; L shouler, lateral L brachium, medial to lateral L brachium,  lateral L brachium, re-worked anastomosis, L antebrachium anterior/posterior then re-worked all surfaces; deep abdominals.              PT Education - 03/22/20 1702    Education Details  Pt will continue with her exercises at home. Discussed not wearing compression bra if it is bothering her skin but weighing pros cons of swelling vs. skin issues. Currently pt states that she feels the skin is getting better and it was more aggravating to not wear compression bra.    Person(s) Educated  Patient    Methods  Explanation    Comprehension  Verbalized understanding       PT Short Term Goals - 03/03/20 1644      PT SHORT TERM GOAL #1   Title  Pt will be independent with home HEP and MLD within 2 weeks in order to demonstrate autonomy of care.    Baseline  pt currently is not performing MLD or her HEP    Time  4    Period  Weeks    Status  New    Target Date  04/21/20        PT Long Term Goals - 03/03/20 1644      PT LONG TERM GOAL #1   Title  Pt will demonstrate 150 degrees L shoulder flexion and ROM within 4 weeks in order to demonstrate functional improvements in L shoulder ROM ROM.    Baseline  L shoulder abduction 123: flexion: 136    Time  4    Period  Weeks    Status  New    Target Date  04/21/20      PT LONG TERM GOAL #2   Title  Pt will report 1/10 or less pain in the L shoulder to demonstrate a subjective improvement in quality of life with use of her LUE.    Baseline  3/10 pain    Time  4    Period  Weeks    Status  New    Target Date  04/21/20      PT LONG TERM GOAL #3   Title  Pt will demonstrate 1 cm decrease in circumferential measurements of the L brachium within 4 weeks to demonstrate decreased fluid.    Baseline  see measurements.    Time  4    Period  Weeks    Status  New    Target Date  04/21/20      PT LONG TERM GOAL #4   Title  Pt will be fitted with appropriate compression garment within 4 weeks for home management of lymphedema.    Baseline   pt currently has a compression bra    Time  4    Period  Weeks    Status  New  Target Date  04/21/20            Plan - 03/22/20 1605    Clinical Impression Statement  Pt presents to physical therapy with significant P/ROM of the L UE without pain at the shoulder. She did report stiffness at the L [facility] joint with prolonged positioning in abduction that resolved with grade III posterior mobs of the clavicle on acromion. Myofascial release performed only over cording from the antecubital fossa to the wrist this session due to skin is red from radiation to avoid skin irritation. Light STM performed over the anterior/posterior antebrachium due to tightness in the wrist flexors/extensors and in the lateral deloid; decreased minimally following light STM and STM over cording. P/ROM was performed in mulitple planes followed by full MLD sequencing of the L trunk and LUE in order to promote fluid flow following radiation away from the L upper quadrant. Pt will benefit from continued POC at this time.    Personal Factors and Comorbidities  Comorbidity 2    Comorbidities  Lumpectomy with 5 lymph node removal and radiation on the L    Rehab Potential  Good    PT Frequency  2x / week    PT Duration  4 weeks    PT Treatment/Interventions  Moist Heat;Therapeutic exercise;Neuromuscular re-education;Manual techniques;Therapeutic activities    PT Next Visit Plan  myofascial release, scapular/shoulder stabilization exercises, did she get compression sleeve?    PT Home Exercise Plan  see pt instructions    Consulted and Agree with Plan of Care  Patient       Patient will benefit from skilled therapeutic intervention in order to improve the following deficits and impairments:  Increased edema, Postural dysfunction, Decreased range of motion, Pain, Decreased knowledge of precautions  Visit Diagnosis: Malignant neoplasm of upper-outer quadrant of left breast in female, estrogen receptor positive  (HCC)  Abnormal posture  Stiffness of left shoulder, not elsewhere classified  Chronic left shoulder pain  Lymphedema, not elsewhere classified  Localized edema  Aftercare following surgery for neoplasm     Problem List Patient Active Problem List   Diagnosis Date Noted  . Port-A-Cath in place 11/20/2019  . Malignant neoplasm of upper-outer quadrant of left breast in female, estrogen receptor positive ([place]) 09/09/2019    Ander Purpura, PT 03/22/2020, 5:09 PM  [place], [place], [address] Phone: [phone]   Fax:  [phone]  Name: Hannah Wallace MRN: [id number] Date of Birth: Apr 29, 1956

## 2020-03-24 ENCOUNTER — Ambulatory Visit: Payer: BC Managed Care – PPO

## 2020-03-24 ENCOUNTER — Other Ambulatory Visit: Payer: Self-pay

## 2020-03-24 DIAGNOSIS — R600 Localized edema: Secondary | ICD-10-CM

## 2020-03-24 DIAGNOSIS — Z483 Aftercare following surgery for neoplasm: Secondary | ICD-10-CM

## 2020-03-24 DIAGNOSIS — R293 Abnormal posture: Secondary | ICD-10-CM

## 2020-03-24 DIAGNOSIS — Z170 Estrogen receptor positive status [ER+]: Secondary | ICD-10-CM

## 2020-03-24 DIAGNOSIS — C50412 Malignant neoplasm of upper-outer quadrant of left female breast: Secondary | ICD-10-CM

## 2020-03-24 DIAGNOSIS — I890 Lymphedema, not elsewhere classified: Secondary | ICD-10-CM

## 2020-03-24 DIAGNOSIS — M25512 Pain in left shoulder: Secondary | ICD-10-CM

## 2020-03-24 DIAGNOSIS — M25612 Stiffness of left shoulder, not elsewhere classified: Secondary | ICD-10-CM

## 2020-03-24 DIAGNOSIS — G8929 Other chronic pain: Secondary | ICD-10-CM

## 2020-03-24 NOTE — Therapy (Signed)
[place], [place], [address] Phone: [phone]   Fax:  [phone]  Physical Therapy Treatment  Patient Details  Name: Hannah Wallace MRN: [id number] Date of Birth: 08-26-1956 Referring Provider (PT): Shona Simpson PA   Encounter Date: 03/24/2020  PT End of Session - 03/24/20 1107    Visit Number  3    Number of Visits  9    Date for PT Re-Evaluation  04/21/20    PT Start Time  1106    PT Stop Time  1200    PT Time Calculation (min)  54 min    Activity Tolerance  Patient tolerated treatment well    Behavior During Therapy  [facility] for tasks assessed/performed       Past Medical History:  Diagnosis Date  . Basal cell carcinoma    on back  . Cancer [facility])    breast cancer    Past Surgical History:  Procedure Laterality Date  . BREAST LUMPECTOMY WITH RADIOACTIVE SEED AND SENTINEL LYMPH NODE BIOPSY Left 09/25/2019   Procedure: LEFT BREAST LUMPECTOMY WITH RADIOACTIVE SEED AND SENTINEL LYMPH NODE BIOPSY;  Surgeon: Stark Klein, MD;  Location: [place];  Service: General;  Laterality: Left;  . COLONOSCOPY    . PORT-A-CATH REMOVAL N/A 03/16/2020   Procedure: REMOVAL PORT-A-CATH;  Surgeon: Stark Klein, MD;  Location: [place];  Service: General;  Laterality: N/A;  . PORTACATH PLACEMENT Left 11/19/2019   Procedure: INSERTION PORT-A-CATH;  Surgeon: Stark Klein, MD;  Location: [place];  Service: General;  Laterality: Left;    There were no vitals filed for this visit.  Subjective Assessment - 03/24/20 1107    Subjective  Pt states that she is feeling much better in her L arm but she continues with a throbbing aching feeling in her L breast. She is also very itchy in her L breast following the radiation.    Pertinent History  L breast cancer with radiation and chemotherapy. She is done with chemotherapy and currently has 1 more week of radiation.    Patient Stated Goals   I want to be able to bear weight through my arm and not be afraid and to decrease the pain.    Currently in Pain?  Yes    Pain Score  1     Pain Location  Breast    Pain Orientation  Left    Pain Onset  More than a month ago    Pain Frequency  Intermittent    Aggravating Factors   unknown    Pain Relieving Factors  unknown    Effect of Pain on Daily Activities  Pt states that in the morning it feels better and then as the day progresses she gets more pain in her L breast.                   Outpatient Rehab from 03/03/2020 in [place]  Lymphedema Life Impact Scale Total Score  13.24 %           [facility] Adult PT Treatment/Exercise - 03/24/20 0001      Manual Therapy   Manual Therapy  Manual Lymphatic Drainage (MLD);Joint mobilization;Passive ROM    Joint Mobilization  Acromioclavicular joint grade III 8x8 2x with reports of pain in this area into greater range of motion.     Manual Lymphatic Drainage (MLD)  In supine: short neck, swimming in the terminus, bil shoulder collectors, bil axillary and  L inguinal nodes, anterior inter-axillary anasotmosis and L axillo-inguinal anastomosis followed by Figure 7 toward the L axillo-inguinal anastomosis, superior/inferior breast toward corresponding anastomosis then re-worked anastomosis; L shoulder, lateral L brachium, medial to lateral L brachium, lateral L brachium, re-worked anastomosis, L antebrachium anterior/posterior then re-worked all surfaces, R side-lying L axillo-inguinal anastmosis, posterior inter-axillary anastomosis, figure 7 toward the L axillo-inguinal anastomosis and ingunal nodes; Back into supine re-worked all surfaces ; deep abdominals.     Passive ROM  P/ROM into flexion/abduction with intermittent joint mobilizations at [facility] joint.              PT Education - 03/24/20 1453    Education Details  Discussed with patient that the aching pain may be coming from the edema rather  than the cording. She is no longer significantly impeded by cording; She does continue with stiffness at her Acromio-clavicular joint and discussed with patient that this is most likely due to siffness from disuse into greater ranges.    Person(s) Educated  Patient    Methods  Explanation    Comprehension  Verbalized understanding       PT Short Term Goals - 03/03/20 1644      PT SHORT TERM GOAL #1   Title  Pt will be independent with home HEP and MLD within 2 weeks in order to demonstrate autonomy of care.    Baseline  pt currently is not performing MLD or her HEP    Time  4    Period  Weeks    Status  New    Target Date  04/21/20        PT Long Term Goals - 03/03/20 1644      PT LONG TERM GOAL #1   Title  Pt will demonstrate 150 degrees L shoulder flexion and ROM within 4 weeks in order to demonstrate functional improvements in L shoulder ROM ROM.    Baseline  L shoulder abduction 123: flexion: 136    Time  4    Period  Weeks    Status  New    Target Date  04/21/20      PT LONG TERM GOAL #2   Title  Pt will report 1/10 or less pain in the L shoulder to demonstrate a subjective improvement in quality of life with use of her LUE.    Baseline  3/10 pain    Time  4    Period  Weeks    Status  New    Target Date  04/21/20      PT LONG TERM GOAL #3   Title  Pt will demonstrate 1 cm decrease in circumferential measurements of the L brachium within 4 weeks to demonstrate decreased fluid.    Baseline  see measurements.    Time  4    Period  Weeks    Status  New    Target Date  04/21/20      PT LONG TERM GOAL #4   Title  Pt will be fitted with appropriate compression garment within 4 weeks for home management of lymphedema.    Baseline  pt currently has a compression bra    Time  4    Period  Weeks    Status  New    Target Date  04/21/20            Plan - 03/24/20 1107    Clinical Impression Statement  Pt presents to physical therapy reporting decreased pulling  and improved ROM but  continues with aching pain in her L lateral breast and axilla. Extra time was spent on MLD this session in the L trunk toward inguinal nodes anteirorly and posteriorly then extra time spent on the breast and UE; pt reports decreased aching by end of session. She will monitor how long she has relief and how her L breast feels by the end of the day to determine whether she is experiencing more edema or cording pain. P/ROM was performed without cording still noted in the L antebrachium with supination through it does not seem to be impeding movement or increasing pain. Pt did report pain at the L acromioclavicular joint into greater ROM that decreased following Grade III posterior mobs of the clavicle on the acromion. Pt will benefit from continued POC at this time.    Personal Factors and Comorbidities  Comorbidity 2    Comorbidities  Lumpectomy with 5 lymph node removal and radiation on the L    Rehab Potential  Good    PT Frequency  2x / week    PT Duration  4 weeks    PT Treatment/Interventions  Moist Heat;Therapeutic exercise;Neuromuscular re-education;Manual techniques;Therapeutic activities    PT Next Visit Plan  measure for compression sleeve, myofascial release, scapular/shoulder stabilization exercises, did she get compression sleeve?    PT Home Exercise Plan  see pt instructions    Consulted and Agree with Plan of Care  Patient       Patient will benefit from skilled therapeutic intervention in order to improve the following deficits and impairments:  Increased edema, Postural dysfunction, Decreased range of motion, Pain, Decreased knowledge of precautions  Visit Diagnosis: Malignant neoplasm of upper-outer quadrant of left breast in female, estrogen receptor positive (HCC)  Abnormal posture  Stiffness of left shoulder, not elsewhere classified  Chronic left shoulder pain  Lymphedema, not elsewhere classified  Localized edema  Aftercare following surgery for  neoplasm     Problem List Patient Active Problem List   Diagnosis Date Noted  . Port-A-Cath in place 11/20/2019  . Malignant neoplasm of upper-outer quadrant of left breast in female, estrogen receptor positive ([place]) 09/09/2019    Ander Purpura , PT 03/24/2020, 2:58 PM  [place], [place], [address] Phone: [phone]   Fax:  [phone]  Name: Brogan Thorburn MRN: [id number] Date of Birth: 1956/08/01

## 2020-03-29 ENCOUNTER — Ambulatory Visit: Payer: BC Managed Care – PPO

## 2020-03-29 ENCOUNTER — Other Ambulatory Visit: Payer: Self-pay

## 2020-03-29 DIAGNOSIS — R600 Localized edema: Secondary | ICD-10-CM

## 2020-03-29 DIAGNOSIS — M25612 Stiffness of left shoulder, not elsewhere classified: Secondary | ICD-10-CM

## 2020-03-29 DIAGNOSIS — Z483 Aftercare following surgery for neoplasm: Secondary | ICD-10-CM

## 2020-03-29 DIAGNOSIS — C50412 Malignant neoplasm of upper-outer quadrant of left female breast: Secondary | ICD-10-CM

## 2020-03-29 DIAGNOSIS — G8929 Other chronic pain: Secondary | ICD-10-CM

## 2020-03-29 DIAGNOSIS — Z170 Estrogen receptor positive status [ER+]: Secondary | ICD-10-CM

## 2020-03-29 DIAGNOSIS — R293 Abnormal posture: Secondary | ICD-10-CM

## 2020-03-29 DIAGNOSIS — M25512 Pain in left shoulder: Secondary | ICD-10-CM

## 2020-03-29 DIAGNOSIS — I890 Lymphedema, not elsewhere classified: Secondary | ICD-10-CM

## 2020-03-29 NOTE — Patient Instructions (Signed)
Access Code: [email or web address] URL: [email or web address] Date: 03/29/2020 Prepared by: Tomma Rakers  Exercises Supine Shoulder Circles with Weight - 1 x daily - 7 x weekly - 1 sets - 20 reps - do clockwise and then counter clockwise hold Supine Scapular Protraction in Flexion with Dumbbells - 1 x daily - 7 x weekly - 1 sets - 20 reps - keep shoulders down hold Supine Chest Press with Dumbbells - 1 x daily - 7 x weekly - 1 sets - 20 reps

## 2020-03-29 NOTE — Therapy (Addendum)
[place], [place], [address] Phone: [phone]   Fax:  [phone]  Physical Therapy Treatment  Patient Details  Name: Hannah Wallace MRN: [id number] Date of Birth: [birth year] Referring Provider (PT): Shona Simpson PA   Encounter Date: 03/29/2020  PT End of Session - 03/29/20 0807    Visit Number  4    Number of Visits  9    Date for PT Re-Evaluation  04/21/20    PT Start Time  0806    PT Stop Time  0902    PT Time Calculation (min)  56 min    Activity Tolerance  Patient tolerated treatment well    Behavior During Therapy  [facility] for tasks assessed/performed       Past Medical History:  Diagnosis Date  . Basal cell carcinoma    on back  . Cancer [facility])    breast cancer    Past Surgical History:  Procedure Laterality Date  . BREAST LUMPECTOMY WITH RADIOACTIVE SEED AND SENTINEL LYMPH NODE BIOPSY Left 09/25/2019   Procedure: LEFT BREAST LUMPECTOMY WITH RADIOACTIVE SEED AND SENTINEL LYMPH NODE BIOPSY;  Surgeon: Stark Klein, MD;  Location: [place];  Service: General;  Laterality: Left;  . COLONOSCOPY    . PORT-A-CATH REMOVAL N/A 03/16/2020   Procedure: REMOVAL PORT-A-CATH;  Surgeon: Stark Klein, MD;  Location: [place];  Service: General;  Laterality: N/A;  . PORTACATH PLACEMENT Left 11/19/2019   Procedure: INSERTION PORT-A-CATH;  Surgeon: Stark Klein, MD;  Location: [place];  Service: General;  Laterality: Left;    There were no vitals filed for this visit.  Subjective Assessment - 03/29/20 0807    Subjective  Pt states that she doesn't have any pain but is still itching from the radiation. She states that she had less pain/throbbing in her L breast but a circle appeared where she had had the targeted radiation and this is exactly where she has been having pain.    Pertinent History  L breast cancer with radiation and chemotherapy. She is done with  chemotherapy and currently has 1 more week of radiation.    Patient Stated Goals  I want to be able to bear weight through my arm and not be afraid and to decrease the pain.    Currently in Pain?  Yes    Pain Score  2     Pain Location  Other (Comment)   itching on the L breast                  Outpatient Rehab from 03/03/2020 in [place]  Lymphedema Life Impact Scale Total Score  13.24 %           [facility] Adult PT Treatment/Exercise - 03/29/20 0001      Exercises   Exercises  Shoulder      Shoulder Exercises: Supine   Protraction  Strengthening;Both;20 reps    Protraction Weight (lbs)  3    Protraction Limitations  Supine shoulder protraction/retraction wiht demonstration and VC to keep the shoulders down/back in order to activate rhomboids and improve scapulo humeral proprioception.     Other Supine Exercises  Supine shoulders circles clockwise/counter clockwise 20x each direction 3# with VC to keep shoulders down and slow movement with better movement on the R compared to the L.     Other Supine Exercises  Chest press 3# bil 20x with demonstration and VC for slow movement as well as keeping  the shoulders down to prevent hiking.       Manual Therapy   Manual Therapy  Manual Lymphatic Drainage (MLD);Joint mobilization;Passive ROM    Myofascial Release  Easy myofascial release along the superior/lateral L breast with tight fascial tissue noted and from the lateral breast near the areola into the medial mid L brachium due to continued cording in this area; minimal improvement by end of session very light due to pt is still peeling from radiation.     Manual Lymphatic Drainage (MLD)  In supine: short neck, swimming in the terminus, bil shoulder collectors, bil axillary and L inguinal nodes, superior/inferior breast toward corresponding anastomosis then re-worked anastomosis with extra time spent at the breast and anastomosis; deep  diaphragmatic breathes.              PT Education - 03/29/20 0954    Education Details  Access Code: [email or web address]: Pt will work on HEP at home with a focus on keeping her shoulders down and activating rhomboids.    Person(s) Educated  Patient    Methods  Explanation;Verbal cues;Demonstration;Handout    Comprehension  Verbalized understanding;Returned demonstration       PT Short Term Goals - 03/03/20 1644      PT SHORT TERM GOAL #1   Title  Pt will be independent with home HEP and MLD within 2 weeks in order to demonstrate autonomy of care.    Baseline  pt currently is not performing MLD or her HEP    Time  4    Period  Weeks    Status  New    Target Date  04/21/20        PT Long Term Goals - 03/03/20 1644      PT LONG TERM GOAL #1   Title  Pt will demonstrate 150 degrees L shoulder flexion and ROM within 4 weeks in order to demonstrate functional improvements in L shoulder ROM ROM.    Baseline  L shoulder abduction 123: flexion: 136    Time  4    Period  Weeks    Status  New    Target Date  04/21/20      PT LONG TERM GOAL #2   Title  Pt will report 1/10 or less pain in the L shoulder to demonstrate a subjective improvement in quality of life with use of her LUE.    Baseline  3/10 pain    Time  4    Period  Weeks    Status  New    Target Date  04/21/20      PT LONG TERM GOAL #3   Title  Pt will demonstrate 1 cm decrease in circumferential measurements of the L brachium within 4 weeks to demonstrate decreased fluid.    Baseline  see measurements.    Time  4    Period  Weeks    Status  New    Target Date  04/21/20      PT LONG TERM GOAL #4   Title  Pt will be fitted with appropriate compression garment within 4 weeks for home management of lymphedema.    Baseline  pt currently has a compression bra    Time  4    Period  Weeks    Status  New    Target Date  04/21/20            Plan - 03/29/20 0806    Clinical Impression Statement  Pt continues with  only minimal pulling  in her L axilla into abduction and reports that occasionally she feels radiating pain to her L nipple; light myofascial release this session due to noted adhesions and reports of pain skin is still healing from radiation. MLD was performed this session with extra time spent at anastomosis and L breast to move fluid from the L lateral breast. Scapular stabilization exercises were intiated this session and easy pectoralis activation with slow eccentric in order to improve shoulder movement to help break up scar tissue formation, improve fluid flow and circulation. Pt will benefit from continued POC at this time.    Personal Factors and Comorbidities  Comorbidity 2    Comorbidities  Lumpectomy with 5 lymph node removal and radiation on the L    Rehab Potential  Good    PT Frequency  2x / week    PT Duration  4 weeks    PT Treatment/Interventions  Moist Heat;Therapeutic exercise;Neuromuscular re-education;Manual techniques;Therapeutic activities    PT Next Visit Plan  measure for compression sleeve, myofascial release, scapular/shoulder stabilization exercises, did she get compression sleeve?    PT Home Exercise Plan  see pt instructions    Recommended Other Services  Pt was measured for compression sleeve and provided with information to order including size    Consulted and Agree with Plan of Care  Patient       Patient will benefit from skilled therapeutic intervention in order to improve the following deficits and impairments:  Increased edema, Postural dysfunction, Decreased range of motion, Pain, Decreased knowledge of precautions  Visit Diagnosis: Malignant neoplasm of upper-outer quadrant of left breast in female, estrogen receptor positive (HCC)  Abnormal posture  Stiffness of left shoulder, not elsewhere classified  Chronic left shoulder pain  Lymphedema, not elsewhere classified  Localized edema  Aftercare following surgery for neoplasm     Problem  List Patient Active Problem List   Diagnosis Date Noted  . Port-A-Cath in place 11/20/2019  . Malignant neoplasm of upper-outer quadrant of left breast in female, estrogen receptor positive ([place]) 09/09/2019    Ander Purpura, PT 03/29/2020, 10:05 AM  [place], [place], [address] Phone: [phone]   Fax:  [phone]  Name: Hannah Wallace MRN: [id number] Date of Birth: [birth year]

## 2020-03-31 ENCOUNTER — Ambulatory Visit: Payer: BC Managed Care – PPO

## 2020-03-31 ENCOUNTER — Other Ambulatory Visit: Payer: Self-pay

## 2020-03-31 DIAGNOSIS — Z170 Estrogen receptor positive status [ER+]: Secondary | ICD-10-CM

## 2020-03-31 DIAGNOSIS — M25512 Pain in left shoulder: Secondary | ICD-10-CM

## 2020-03-31 DIAGNOSIS — I890 Lymphedema, not elsewhere classified: Secondary | ICD-10-CM

## 2020-03-31 DIAGNOSIS — M25612 Stiffness of left shoulder, not elsewhere classified: Secondary | ICD-10-CM

## 2020-03-31 DIAGNOSIS — C50412 Malignant neoplasm of upper-outer quadrant of left female breast: Secondary | ICD-10-CM | POA: Diagnosis not present

## 2020-03-31 DIAGNOSIS — G8929 Other chronic pain: Secondary | ICD-10-CM

## 2020-03-31 DIAGNOSIS — R600 Localized edema: Secondary | ICD-10-CM

## 2020-03-31 DIAGNOSIS — R293 Abnormal posture: Secondary | ICD-10-CM

## 2020-03-31 DIAGNOSIS — Z483 Aftercare following surgery for neoplasm: Secondary | ICD-10-CM

## 2020-03-31 NOTE — Therapy (Signed)
[place], [place], [address] Phone: [phone]   Fax:  [phone]  Physical Therapy Treatment  Patient Details  Name: Hannah Wallace MRN: [id number] Date of Birth: [birth year] Referring Provider (PT): Shona Simpson PA   Encounter Date: 03/31/2020  PT End of Session - 03/31/20 0805    Visit Number  5    Number of Visits  9    Date for PT Re-Evaluation  04/21/20    PT Start Time  0804    PT Stop Time  0900    PT Time Calculation (min)  56 min    Activity Tolerance  Patient tolerated treatment well    Behavior During Therapy  [facility] for tasks assessed/performed       Past Medical History:  Diagnosis Date  . Basal cell carcinoma    on back  . Cancer [facility])    breast cancer    Past Surgical History:  Procedure Laterality Date  . BREAST LUMPECTOMY WITH RADIOACTIVE SEED AND SENTINEL LYMPH NODE BIOPSY Left 09/25/2019   Procedure: LEFT BREAST LUMPECTOMY WITH RADIOACTIVE SEED AND SENTINEL LYMPH NODE BIOPSY;  Surgeon: Stark Klein, MD;  Location: [place];  Service: General;  Laterality: Left;  . COLONOSCOPY    . PORT-A-CATH REMOVAL N/A 03/16/2020   Procedure: REMOVAL PORT-A-CATH;  Surgeon: Stark Klein, MD;  Location: [place];  Service: General;  Laterality: N/A;  . PORTACATH PLACEMENT Left 11/19/2019   Procedure: INSERTION PORT-A-CATH;  Surgeon: Stark Klein, MD;  Location: [place];  Service: General;  Laterality: Left;    There were no vitals filed for this visit.  Subjective Assessment - 03/31/20 0806    Subjective  Pt states that she was a little sore after doing the exercises last session but overall she feles pretty good.    Pertinent History  L breast cancer with radiation and chemotherapy. She is done with chemotherapy and currently has 1 more week of radiation.    Patient Stated Goals  I want to be able to bear weight through my arm and not be afraid and to  decrease the pain.    Currently in Pain?  No/denies    Pain Score  0-No pain                   Outpatient Rehab from 03/03/2020 in [place]  Lymphedema Life Impact Scale Total Score  13.24 %           [facility] Adult PT Treatment/Exercise - 03/31/20 0001      Shoulder Exercises: Supine   Other Supine Exercises  Chest press 3# bil 20x with demonstration and VC for slow movement as well as keeping the shoulders down to prevent hiking.       Shoulder Exercises: Sidelying   External Rotation  Strengthening;Left;10 reps    External Rotation Weight (lbs)  3    External Rotation Limitations  VC to keep elbow tucked and squeeze scapula at end range .    ABduction  Strengthening;Left;10 reps    ABduction Weight (lbs)  3    ABduction Limitations  VC for going only up to 90 degrees and slow movement. Tremors noted due to weakness.       Manual Therapy   Manual Therapy  Manual Lymphatic Drainage (MLD);Joint mobilization;Passive ROM;Soft tissue mobilization    Joint Mobilization  Acromioclavicular joint posteriorly on clavicle, posterior/inferior on the humeral head grade III 8x8 2x with reports of pain  in this area into greater range of motion.     Soft tissue mobilization  STM along the medial brachium through the axilla and lateral to the L breast along area of tightness reported by pt with A/ROM into abduction and P/ROM into abduction multiple times     Manual Lymphatic Drainage (MLD)  In supine:short neck, swimming in the terminus, bil shoulder collectors, bil axillary and L inguinal nodes, superior/inferior breast toward corresponding anastomosis then re-worked anastomosis with extra time spent at the breast and anastomosis, L lateral arm, medial lateral brachium, lateral brachium, re-worked anastomosis; deep diaphragmatic breathes             PT Education - 03/31/20 0902    Education Details  Pt will continue with her current HEP at  home.    Person(s) Educated  Patient    Methods  Explanation    Comprehension  Verbalized understanding       PT Short Term Goals - 03/03/20 1644      PT SHORT TERM GOAL #1   Title  Pt will be independent with home HEP and MLD within 2 weeks in order to demonstrate autonomy of care.    Baseline  pt currently is not performing MLD or her HEP    Time  4    Period  Weeks    Status  New    Target Date  04/21/20        PT Long Term Goals - 03/03/20 1644      PT LONG TERM GOAL #1   Title  Pt will demonstrate 150 degrees L shoulder flexion and ROM within 4 weeks in order to demonstrate functional improvements in L shoulder ROM ROM.    Baseline  L shoulder abduction 123: flexion: 136    Time  4    Period  Weeks    Status  New    Target Date  04/21/20      PT LONG TERM GOAL #2   Title  Pt will report 1/10 or less pain in the L shoulder to demonstrate a subjective improvement in quality of life with use of her LUE.    Baseline  3/10 pain    Time  4    Period  Weeks    Status  New    Target Date  04/21/20      PT LONG TERM GOAL #3   Title  Pt will demonstrate 1 cm decrease in circumferential measurements of the L brachium within 4 weeks to demonstrate decreased fluid.    Baseline  see measurements.    Time  4    Period  Weeks    Status  New    Target Date  04/21/20      PT LONG TERM GOAL #4   Title  Pt will be fitted with appropriate compression garment within 4 weeks for home management of lymphedema.    Baseline  pt currently has a compression bra    Time  4    Period  Weeks    Status  New    Target Date  04/21/20            Plan - 03/31/20 0805    Clinical Impression Statement  Pt continues with reports of pulling in her axilla and down to her lateral L breast into end range abduction; multiple sets of soft tissue mobilization with P/ROM and A/ROM along the medial brachium, axilla and to the lateral L breast. Pt reports decreased tightness by end  of session. Pt  reported increased shouler pain into greater ROM that improved following Grade III mobs of the humeral head and clavicle on acromion. Increased shoulder stabilization ther-ex was initiated this session w/o an increase in pain. Pt will continue with her current HEP at this time. Pt will benefit from continued POC.    Personal Factors and Comorbidities  Comorbidity 2    Comorbidities  Lumpectomy with 5 lymph node removal and radiation on the L    Rehab Potential  Good    PT Frequency  2x / week    PT Duration  4 weeks    PT Treatment/Interventions  Moist Heat;Therapeutic exercise;Neuromuscular re-education;Manual techniques;Therapeutic activities    PT Next Visit Plan  new HEP? myofascial release, scapular/shoulder stabilization exercises, did she get compression sleeve?    PT Home Exercise Plan  see pt instructions    Consulted and Agree with Plan of Care  Patient       Patient will benefit from skilled therapeutic intervention in order to improve the following deficits and impairments:  Increased edema, Postural dysfunction, Decreased range of motion, Pain, Decreased knowledge of precautions  Visit Diagnosis: Malignant neoplasm of upper-outer quadrant of left breast in female, estrogen receptor positive (HCC)  Abnormal posture  Stiffness of left shoulder, not elsewhere classified  Chronic left shoulder pain  Lymphedema, not elsewhere classified  Localized edema  Aftercare following surgery for neoplasm     Problem List Patient Active Problem List   Diagnosis Date Noted  . Port-A-Cath in place 11/20/2019  . Malignant neoplasm of upper-outer quadrant of left breast in female, estrogen receptor positive ([place]) 09/09/2019    Ander Purpura , PT 03/31/2020, [place], [place], [address] Phone: [phone]   Fax:  [phone]  Name: Hannah Wallace MRN: [id number] Date of Birth:  [birth year]

## 2020-04-04 NOTE — Progress Notes (Signed)
  Radiation Oncology         [phone] ________________________________  Name: Hannah Wallace MRN: [id number]  Date: 03/12/2020  DOB: [birth year]  End of Treatment Note  Diagnosis:   left-sided breast cancer     Indication for treatment:  Curative       Radiation treatment dates:   02/16/20 - 03/12/20  Site/dose:   The patient initially received a dose of 42.56 Gy in 16 fractions to the breast using whole-breast tangent fields. This was delivered using a 3-D conformal technique. The patient then received a boost to the seroma. This delivered an additional 10 Gy in 36fractions using an en face electron field due to the depth of the seroma. The total dose was 52.56Gy.  Narrative: The patient tolerated radiation treatment relatively well.   The patient had some expected skin irritation as she progressed during treatment. Moist desquamation was not present at the end of treatment.  Plan: The patient has completed radiation treatment. The patient will return to radiation oncology clinic for routine followup in one month. I advised the patient to call or return sooner if they have any questions or concerns related to their recovery or treatment. ________________________________  Jodelle Gross, M.D., Ph.D.

## 2020-04-07 ENCOUNTER — Other Ambulatory Visit: Payer: Self-pay

## 2020-04-07 ENCOUNTER — Ambulatory Visit: Payer: BC Managed Care – PPO | Attending: Radiation Oncology

## 2020-04-07 DIAGNOSIS — M25612 Stiffness of left shoulder, not elsewhere classified: Secondary | ICD-10-CM | POA: Insufficient documentation

## 2020-04-07 DIAGNOSIS — I890 Lymphedema, not elsewhere classified: Secondary | ICD-10-CM | POA: Diagnosis present

## 2020-04-07 DIAGNOSIS — Z170 Estrogen receptor positive status [ER+]: Secondary | ICD-10-CM | POA: Insufficient documentation

## 2020-04-07 DIAGNOSIS — M25512 Pain in left shoulder: Secondary | ICD-10-CM | POA: Diagnosis present

## 2020-04-07 DIAGNOSIS — C50412 Malignant neoplasm of upper-outer quadrant of left female breast: Secondary | ICD-10-CM

## 2020-04-07 DIAGNOSIS — Z483 Aftercare following surgery for neoplasm: Secondary | ICD-10-CM | POA: Insufficient documentation

## 2020-04-07 DIAGNOSIS — G8929 Other chronic pain: Secondary | ICD-10-CM | POA: Insufficient documentation

## 2020-04-07 DIAGNOSIS — R293 Abnormal posture: Secondary | ICD-10-CM | POA: Diagnosis present

## 2020-04-07 DIAGNOSIS — R600 Localized edema: Secondary | ICD-10-CM | POA: Diagnosis present

## 2020-04-07 NOTE — Patient Instructions (Signed)
Access Code: [email or web address] URL: [email or web address] Date: 04/07/2020 Prepared by: Tomma Rakers  Exercises Standing Row with Resistance - 1 x daily - 7 x weekly - 1 sets - 18 reps Standing Shoulder Extension with Resistance - 1 x daily - 7 x weekly - 1 sets - 18 reps Standing Shoulder External Rotation with Resistance - 1 x daily - 7 x weekly - 1 sets - 18 reps

## 2020-04-07 NOTE — Therapy (Signed)
[place], [place], [address] Phone: [phone]   Fax:  [phone]  Physical Therapy Treatment  Patient Details  Name: Hannah Wallace MRN: [id number] Date of Birth: Apr 17, 1956 Referring Provider (PT): Shona Simpson PA   Encounter Date: 04/07/2020  PT End of Session - 04/07/20 0806    Visit Number  6    Number of Visits  9    Date for PT Re-Evaluation  04/21/20    PT Start Time  0805    PT Stop Time  0859    PT Time Calculation (min)  54 min    Activity Tolerance  Patient tolerated treatment well    Behavior During Therapy  [facility] for tasks assessed/performed       Past Medical History:  Diagnosis Date  . Basal cell carcinoma    on back  . Cancer [facility])    breast cancer    Past Surgical History:  Procedure Laterality Date  . BREAST LUMPECTOMY WITH RADIOACTIVE SEED AND SENTINEL LYMPH NODE BIOPSY Left 09/25/2019   Procedure: LEFT BREAST LUMPECTOMY WITH RADIOACTIVE SEED AND SENTINEL LYMPH NODE BIOPSY;  Surgeon: Stark Klein, MD;  Location: [place];  Service: General;  Laterality: Left;  . COLONOSCOPY    . PORT-A-CATH REMOVAL N/A 03/16/2020   Procedure: REMOVAL PORT-A-CATH;  Surgeon: Stark Klein, MD;  Location: [place];  Service: General;  Laterality: N/A;  . PORTACATH PLACEMENT Left 11/19/2019   Procedure: INSERTION PORT-A-CATH;  Surgeon: Stark Klein, MD;  Location: [place];  Service: General;  Laterality: Left;    There were no vitals filed for this visit.  Subjective Assessment - 04/07/20 0806    Subjective  Pt states that she has a little sore spot in the back of her L shoulder. Overall she feels pretty well.    Pertinent History  L breast cancer with radiation and chemotherapy. She is done with chemotherapy and currently has 1 more week of radiation.    Patient Stated Goals  I want to be able to bear weight through my arm and not be afraid and to decrease  the pain.    Currently in Pain?  Yes    Pain Score  1     Pain Location  Shoulder    Pain Orientation  Left    Pain Descriptors / Indicators  Aching    Pain Type  Acute pain    Pain Onset  More than a month ago    Pain Frequency  Intermittent    Aggravating Factors   pt states she may have slept on her shoulder funny    Pain Relieving Factors  rest                   Outpatient Rehab from 03/03/2020 in [place]  Lymphedema Life Impact Scale Total Score  13.24 %           [facility] Adult PT Treatment/Exercise - 04/07/20 0001      Shoulder Exercises: Standing   External Rotation  Strengthening;Both;20 reps    Theraband Level (Shoulder External Rotation)  Level 2 (Red)    External Rotation Limitations  demonstration with VC to squeeze scapula and keep core tight.     Extension  Strengthening;Both;10 reps    Theraband Level (Shoulder Extension)  Level 2 (Red)    Extension Limitations  demonstration with VC for scapular squeeze, core tight and one foot in front of the other for balance.  Row  Strengthening;Both;20 reps    Theraband Level (Shoulder Row)  Level 2 (Red)    Row Limitations  demonstration with VC to squeeze scapula, keep core tight and put one foot in front of the other.       Manual Therapy   Manual Therapy  Soft tissue mobilization;Joint mobilization;Passive ROM    Manual therapy comments  pt was made a small nubbed dense foam pad over cellona to help break up fibrotic area in the L breast upper/lateral area.     Joint Mobilization  Inferior/posterior Grade III 8x8 each humeral head mobs due to reports of aching at end range of motion on L shoulder flexion; improved followoing mobilizatoins.     Soft tissue mobilization  to the L supra and infra spinatus muscutes, thoracic paraspinals and upper trapezius due to palpable tightness/tenderness; decreased moderately following STM    Passive ROM  P/ROM into end range with  flexion/abduction easy petrissage over cording but cording does not seem to be limiting patient any longer.              PT Education - 04/07/20 0904    Education Details  Access Code: [email or web address], pt was provided with standing scapular stablization/shoulder strengthening exercises.    Person(s) Educated  Patient    Methods  Explanation;Demonstration;Verbal cues;Handout    Comprehension  Verbalized understanding;Returned demonstration       PT Short Term Goals - 03/03/20 1644      PT SHORT TERM GOAL #1   Title  Pt will be independent with home HEP and MLD within 2 weeks in order to demonstrate autonomy of care.    Baseline  pt currently is not performing MLD or her HEP    Time  4    Period  Weeks    Status  New    Target Date  04/21/20        PT Long Term Goals - 03/03/20 1644      PT LONG TERM GOAL #1   Title  Pt will demonstrate 150 degrees L shoulder flexion and ROM within 4 weeks in order to demonstrate functional improvements in L shoulder ROM ROM.    Baseline  L shoulder abduction 123: flexion: 136    Time  4    Period  Weeks    Status  New    Target Date  04/21/20      PT LONG TERM GOAL #2   Title  Pt will report 1/10 or less pain in the L shoulder to demonstrate a subjective improvement in quality of life with use of her LUE.    Baseline  3/10 pain    Time  4    Period  Weeks    Status  New    Target Date  04/21/20      PT LONG TERM GOAL #3   Title  Pt will demonstrate 1 cm decrease in circumferential measurements of the L brachium within 4 weeks to demonstrate decreased fluid.    Baseline  see measurements.    Time  4    Period  Weeks    Status  New    Target Date  04/21/20      PT LONG TERM GOAL #4   Title  Pt will be fitted with appropriate compression garment within 4 weeks for home management of lymphedema.    Baseline  pt currently has a compression bra    Time  4    Period  Weeks  Status  New    Target Date  04/21/20             Plan - 04/07/20 0804    Clinical Impression Statement  Pt reports that she is feeling much better she has much less pulling noted in her L axilla. Today she has some pain in her L shoulder due to she states she may have slept on it in a bad position. Cording continues but seems to be no longer limiting pt mobility. She currently continues with shoulder stiffness at end range that improves with grade III mobilizations of the humeral head. Palpable tightness/tenderness noted at the L supra/infraspinatus and surrounding periscapulra musculature; decreased following STM. Standing scapular stabilization exercises were initiated with increaesd resistance band to help improve stability at the L shoulder. Pt will benefit from continued POC at this time and was provided with red theraband.    Personal Factors and Comorbidities  Comorbidity 2    Comorbidities  Lumpectomy with 5 lymph node removal and radiation on the L    Rehab Potential  Good    PT Frequency  2x / week    PT Duration  4 weeks    PT Treatment/Interventions  Moist Heat;Therapeutic exercise;Neuromuscular re-education;Manual techniques;Therapeutic activities    PT Next Visit Plan  new HEP? myofascial release, scapular/shoulder stabilization exercises, did she get compression sleeve?    PT Home Exercise Plan  see pt instructions    Consulted and Agree with Plan of Care  Patient       Patient will benefit from skilled therapeutic intervention in order to improve the following deficits and impairments:  Increased edema, Postural dysfunction, Decreased range of motion, Pain, Decreased knowledge of precautions  Visit Diagnosis: Malignant neoplasm of upper-outer quadrant of left breast in female, estrogen receptor positive (HCC)  Abnormal posture  Stiffness of left shoulder, not elsewhere classified  Chronic left shoulder pain  Lymphedema, not elsewhere classified  Localized edema  Aftercare following surgery for  neoplasm     Problem List Patient Active Problem List   Diagnosis Date Noted  . Port-A-Cath in place 11/20/2019  . Malignant neoplasm of upper-outer quadrant of left breast in female, estrogen receptor positive ([place]) 09/09/2019    Ander Purpura, PT 04/07/2020, 9:08 AM  [place], [place], [address] Phone: [phone]   Fax:  [phone]  Name: Hannah Wallace MRN: [id number] Date of Birth: Feb 10, 1956

## 2020-04-09 ENCOUNTER — Other Ambulatory Visit: Payer: Self-pay

## 2020-04-09 ENCOUNTER — Ambulatory Visit: Payer: BC Managed Care – PPO

## 2020-04-09 DIAGNOSIS — M25512 Pain in left shoulder: Secondary | ICD-10-CM

## 2020-04-09 DIAGNOSIS — M25612 Stiffness of left shoulder, not elsewhere classified: Secondary | ICD-10-CM

## 2020-04-09 DIAGNOSIS — Z483 Aftercare following surgery for neoplasm: Secondary | ICD-10-CM

## 2020-04-09 DIAGNOSIS — R293 Abnormal posture: Secondary | ICD-10-CM

## 2020-04-09 DIAGNOSIS — C50412 Malignant neoplasm of upper-outer quadrant of left female breast: Secondary | ICD-10-CM | POA: Diagnosis not present

## 2020-04-09 DIAGNOSIS — R600 Localized edema: Secondary | ICD-10-CM

## 2020-04-09 DIAGNOSIS — G8929 Other chronic pain: Secondary | ICD-10-CM

## 2020-04-09 DIAGNOSIS — I890 Lymphedema, not elsewhere classified: Secondary | ICD-10-CM

## 2020-04-09 NOTE — Therapy (Signed)
[place], [place], [address] Phone: [phone]   Fax:  [phone]  Physical Therapy Treatment  Patient Details  Name: Hannah Wallace MRN: [id number] Date of Birth: [birth year] Referring Provider (PT): Shona Simpson PA   Encounter Date: 04/09/2020  PT End of Session - 04/09/20 0806    Visit Number  7    Number of Visits  9    Date for PT Re-Evaluation  04/21/20    PT Start Time  0805    PT Stop Time  0900    PT Time Calculation (min)  55 min    Activity Tolerance  Patient tolerated treatment well    Behavior During Therapy  [facility] for tasks assessed/performed       Past Medical History:  Diagnosis Date  . Basal cell carcinoma    on back  . Cancer [facility]    breast cancer    Past Surgical History:  Procedure Laterality Date  . BREAST LUMPECTOMY WITH RADIOACTIVE SEED AND SENTINEL LYMPH NODE BIOPSY Left 09/25/2019   Procedure: LEFT BREAST LUMPECTOMY WITH RADIOACTIVE SEED AND SENTINEL LYMPH NODE BIOPSY;  Surgeon: Stark Klein, MD;  Location: [place];  Service: General;  Laterality: Left;  . COLONOSCOPY    . PORT-A-CATH REMOVAL N/A 03/16/2020   Procedure: REMOVAL PORT-A-CATH;  Surgeon: Stark Klein, MD;  Location: [place];  Service: General;  Laterality: N/A;  . PORTACATH PLACEMENT Left 11/19/2019   Procedure: INSERTION PORT-A-CATH;  Surgeon: Stark Klein, MD;  Location: [place];  Service: General;  Laterality: Left;    There were no vitals filed for this visit.  Subjective Assessment - 04/09/20 0806    Subjective  Pt states that she continues to get achy in the L axilla and continues with pains that shoot toward her L nipple. She states that she has an achy pain that gets worse in the evenings.    Pertinent History  L breast cancer with radiation and chemotherapy. She is done with chemotherapy and currently has 1 more week of radiation.    Patient Stated Goals  I  want to be able to bear weight through my arm and not be afraid and to decrease the pain.    Currently in Pain?  Yes    Pain Score  1     Pain Location  Axilla    Pain Orientation  Left    Pain Descriptors / Indicators  Aching    Pain Type  Acute pain    Pain Onset  More than a month ago    Pain Frequency  Intermittent    Aggravating Factors   as the day progresses    Pain Relieving Factors  in the morning    Effect of Pain on Daily Activities  as the day progresses pt reports she gets more and more achy                   Outpatient Rehab from 03/03/2020 in [place]  Lymphedema Life Impact Scale Total Score  13.24 %           [facility] Adult PT Treatment/Exercise - 04/09/20 0001      Manual Therapy   Manual Therapy  Myofascial release;Manual Lymphatic Drainage (MLD);Edema management    Edema Management  1/2 inch gray foam for the L lateral breast over the L axilla due to reports of aching.     Myofascial Release  myofascial release over the port  area (small open are noted) pt states she removed stitch no signs/symptoms of infection noted.     Manual Lymphatic Drainage (MLD)  MLD performed in supine: short neck, swimming in the terminus, bil axillary and L inguinal nodes, anterior inter-axillary anastomosis, L axillo-inguinal anastomosis, superior/inferior breast toward corresponding anastomosis, extra time spent on the inter-axillary anastomosis following myofascial release and then some extra time spent on the L axillo-inguinal with significant improvement of edema in this area following myofascial release and MLD.              PT Education - 04/09/20 0912    Education Details  Pt was educated to wear her dense foam and 1/2 inch gray foam at home to see if this doesn't help with aching. Pt was educated to perform MLD at the end of the day to move fluid from the L breast.    Person(s) Educated  Patient    Methods  Explanation     Comprehension  Verbalized understanding       PT Short Term Goals - 03/03/20 1644      PT SHORT TERM GOAL #1   Title  Pt will be independent with home HEP and MLD within 2 weeks in order to demonstrate autonomy of care.    Baseline  pt currently is not performing MLD or her HEP    Time  4    Period  Weeks    Status  New    Target Date  04/21/20        PT Long Term Goals - 03/03/20 1644      PT LONG TERM GOAL #1   Title  Pt will demonstrate 150 degrees L shoulder flexion and ROM within 4 weeks in order to demonstrate functional improvements in L shoulder ROM ROM.    Baseline  L shoulder abduction 123: flexion: 136    Time  4    Period  Weeks    Status  New    Target Date  04/21/20      PT LONG TERM GOAL #2   Title  Pt will report 1/10 or less pain in the L shoulder to demonstrate a subjective improvement in quality of life with use of her LUE.    Baseline  3/10 pain    Time  4    Period  Weeks    Status  New    Target Date  04/21/20      PT LONG TERM GOAL #3   Title  Pt will demonstrate 1 cm decrease in circumferential measurements of the L brachium within 4 weeks to demonstrate decreased fluid.    Baseline  see measurements.    Time  4    Period  Weeks    Status  New    Target Date  04/21/20      PT LONG TERM GOAL #4   Title  Pt will be fitted with appropriate compression garment within 4 weeks for home management of lymphedema.    Baseline  pt currently has a compression bra    Time  4    Period  Weeks    Status  New    Target Date  04/21/20            Plan - 04/09/20 0806    Clinical Impression Statement  Pt presents to physical therapy with swelling noted under her scar where the port was placed and removed. She continues with significant adhesions under this scar. MLD was performed for the  L breast prior to myofascial release and then spent extra time working fluid away from the area following myofascial release. Cross friction, longitudinal myofascial  release was performed over this incision being careful not to put tension on small open area where pt had removed stitch. Decreased edema over the anteiror L chest wall following MLD and pt reports decreased tightness. Pt was provided with 1/2 inch gray foam to wear over the L lateral breast and into the L axilla to see if this helps with aching at the end of the day. Pt will benefit from continued POC at this time.    Personal Factors and Comorbidities  Comorbidity 2    Comorbidities  Lumpectomy with 5 lymph node removal and radiation on the L    PT Frequency  2x / week    PT Duration  4 weeks    PT Treatment/Interventions  Moist Heat;Therapeutic exercise;Neuromuscular re-education;Manual techniques;Therapeutic activities    PT Next Visit Plan  new HEP? myofascial release, scapular/shoulder stabilization exercises, did she get compression sleeve?    PT Home Exercise Plan  see pt instructions    Consulted and Agree with Plan of Care  Patient       Patient will benefit from skilled therapeutic intervention in order to improve the following deficits and impairments:  Increased edema, Postural dysfunction, Decreased range of motion, Pain, Decreased knowledge of precautions  Visit Diagnosis: Malignant neoplasm of upper-outer quadrant of left breast in female, estrogen receptor positive (HCC)  Abnormal posture  Stiffness of left shoulder, not elsewhere classified  Chronic left shoulder pain  Lymphedema, not elsewhere classified  Localized edema  Aftercare following surgery for neoplasm     Problem List Patient Active Problem List   Diagnosis Date Noted  . Port-A-Cath in place 11/20/2019  . Malignant neoplasm of upper-outer quadrant of left breast in female, estrogen receptor positive ([place]) 09/09/2019    Ander Purpura, PT 04/09/2020, 11:06 AM  [place], [place], [address] Phone: [phone]   Fax:   [phone]  Name: Hannah Wallace MRN: [id number] Date of Birth: [birth year]

## 2020-04-14 ENCOUNTER — Ambulatory Visit: Payer: BC Managed Care – PPO

## 2020-04-14 ENCOUNTER — Other Ambulatory Visit: Payer: Self-pay

## 2020-04-14 DIAGNOSIS — C50412 Malignant neoplasm of upper-outer quadrant of left female breast: Secondary | ICD-10-CM | POA: Diagnosis not present

## 2020-04-14 DIAGNOSIS — M25512 Pain in left shoulder: Secondary | ICD-10-CM

## 2020-04-14 DIAGNOSIS — R600 Localized edema: Secondary | ICD-10-CM

## 2020-04-14 DIAGNOSIS — M25612 Stiffness of left shoulder, not elsewhere classified: Secondary | ICD-10-CM

## 2020-04-14 DIAGNOSIS — Z483 Aftercare following surgery for neoplasm: Secondary | ICD-10-CM

## 2020-04-14 DIAGNOSIS — R293 Abnormal posture: Secondary | ICD-10-CM

## 2020-04-14 DIAGNOSIS — G8929 Other chronic pain: Secondary | ICD-10-CM

## 2020-04-14 DIAGNOSIS — I890 Lymphedema, not elsewhere classified: Secondary | ICD-10-CM

## 2020-04-14 NOTE — Therapy (Signed)
[place], [place], [address] Phone: [phone]   Fax:  [phone]  Physical Therapy Treatment  Patient Details  Name: Hannah Wallace MRN: [id number] Date of Birth: 09-22-1956 Referring Provider (PT): Shona Simpson PA   Encounter Date: 04/14/2020  PT End of Session - 04/14/20 0806    Visit Number  8    Number of Visits  9    Date for PT Re-Evaluation  04/21/20    PT Start Time  0804    PT Stop Time  0900    PT Time Calculation (min)  56 min    Activity Tolerance  Patient tolerated treatment well    Behavior During Therapy  [facility] for tasks assessed/performed       Past Medical History:  Diagnosis Date   Basal cell carcinoma    on back   Cancer [facility])    breast cancer    Past Surgical History:  Procedure Laterality Date   BREAST LUMPECTOMY WITH RADIOACTIVE SEED AND SENTINEL LYMPH NODE BIOPSY Left 09/25/2019   Procedure: LEFT BREAST LUMPECTOMY WITH RADIOACTIVE SEED AND SENTINEL LYMPH NODE BIOPSY;  Surgeon: Stark Klein, MD;  Location: [place];  Service: General;  Laterality: Left;   COLONOSCOPY     PORT-A-CATH REMOVAL N/A 03/16/2020   Procedure: REMOVAL PORT-A-CATH;  Surgeon: Stark Klein, MD;  Location: [place];  Service: General;  Laterality: N/A;   PORTACATH PLACEMENT Left 11/19/2019   Procedure: INSERTION PORT-A-CATH;  Surgeon: Stark Klein, MD;  Location: [place];  Service: General;  Laterality: Left;    There were no vitals filed for this visit.  Subjective Assessment - 04/14/20 0806    Subjective  Pt states that her swelling under her scar has felt much better since her last session. She states that the pain continues to get worse in the afternoon and feels better in the morning.    Pertinent History  L breast cancer with radiation and chemotherapy. She is done with chemotherapy and currently has 1 more week of radiation.    Patient Stated Goals   I want to be able to bear weight through my arm and not be afraid and to decrease the pain.    Currently in Pain?  Yes    Pain Score  1     Pain Location  Axilla    Pain Orientation  Left    Pain Descriptors / Indicators  Aching    Pain Type  Acute pain    Pain Onset  More than a month ago    Pain Frequency  Intermittent    Aggravating Factors   as the day progresses    Pain Relieving Factors  in the morning    Effect of Pain on Daily Activities  as the day progresses pt reports she gets more and more achy                   Outpatient Rehab from 03/03/2020 in [place]  Lymphedema Life Impact Scale Total Score  13.24 %           [facility] Adult PT Treatment/Exercise - 04/14/20 0001      Manual Therapy   Manual Therapy  Myofascial release;Manual Lymphatic Drainage (MLD)    Myofascial Release  myofascial release over the port area cross friction and longitudinally     Manual Lymphatic Drainage (MLD)  MLD performed in supine: short neck, swimming in the terminus, bil axillary and L  inguinal nodes, anterior inter-axillary anastomosis, L axillo-inguinal anastomosis, superior/inferior breast toward corresponding anastomosis, extra time spent on the inter-axillary anastomosis following myofascial release and then some extra time spent on the L axillo-inguinal with significant improvement of edema in this area following myofascial release and MLD.              PT Education - 04/14/20 0906    Education Details  Pt will continue to wear compression and perform MLD at home with exercises.    Person(s) Educated  Patient    Methods  Explanation    Comprehension  Verbalized understanding       PT Short Term Goals - 03/03/20 1644      PT SHORT TERM GOAL #1   Title  Pt will be independent with home HEP and MLD within 2 weeks in order to demonstrate autonomy of care.    Baseline  pt currently is not performing MLD or her HEP    Time  4     Period  Weeks    Status  New    Target Date  04/21/20        PT Long Term Goals - 03/03/20 1644      PT LONG TERM GOAL #1   Title  Pt will demonstrate 150 degrees L shoulder flexion and ROM within 4 weeks in order to demonstrate functional improvements in L shoulder ROM ROM.    Baseline  L shoulder abduction 123: flexion: 136    Time  4    Period  Weeks    Status  New    Target Date  04/21/20      PT LONG TERM GOAL #2   Title  Pt will report 1/10 or less pain in the L shoulder to demonstrate a subjective improvement in quality of life with use of her LUE.    Baseline  3/10 pain    Time  4    Period  Weeks    Status  New    Target Date  04/21/20      PT LONG TERM GOAL #3   Title  Pt will demonstrate 1 cm decrease in circumferential measurements of the L brachium within 4 weeks to demonstrate decreased fluid.    Baseline  see measurements.    Time  4    Period  Weeks    Status  New    Target Date  04/21/20      PT LONG TERM GOAL #4   Title  Pt will be fitted with appropriate compression garment within 4 weeks for home management of lymphedema.    Baseline  pt currently has a compression bra    Time  4    Period  Weeks    Status  New    Target Date  04/21/20            Plan - 04/14/20 0806    Clinical Impression Statement  Pt presents to physical thearpy with improvement in edema around the incision area for port on the L anterior chest wall since her last session. This session focused on MLD and myofascial release over the port incision scar/adhesions to see if this does not help pt with the last bit of pain/edema in this area. MLD for the L breast was performed with extra time spent at anastomosis with improvement in density following treatment session. Pt scar adhesions had very minimal improvement by end of session and pt was encouraged to massage to help break up adhesions in  this area. Pt has 1 more visit and discussed possible discharge next session depending on  pt functional mobility and ability to manage edema at home.    Personal Factors and Comorbidities  Comorbidity 2    Comorbidities  Lumpectomy with 5 lymph node removal and radiation on the L    Rehab Potential  Good    PT Frequency  2x / week    PT Duration  4 weeks    PT Treatment/Interventions  Moist Heat;Therapeutic exercise;Neuromuscular re-education;Manual techniques;Therapeutic activities    PT Next Visit Plan  new HEP? myofascial release, scapular/shoulder stabilization exercises, did she get compression sleeve?    PT Home Exercise Plan  see pt instructions    Consulted and Agree with Plan of Care  Patient       Patient will benefit from skilled therapeutic intervention in order to improve the following deficits and impairments:  Increased edema, Postural dysfunction, Decreased range of motion, Pain, Decreased knowledge of precautions  Visit Diagnosis: Malignant neoplasm of upper-outer quadrant of left breast in female, estrogen receptor positive (HCC)  Abnormal posture  Stiffness of left shoulder, not elsewhere classified  Chronic left shoulder pain  Lymphedema, not elsewhere classified  Localized edema  Aftercare following surgery for neoplasm     Problem List Patient Active Problem List   Diagnosis Date Noted   Port-A-Cath in place 11/20/2019   Malignant neoplasm of upper-outer quadrant of left breast in female, estrogen receptor positive ([place]) 09/09/2019    Ander Purpura, PT 04/14/2020, 10:44 AM  [place], [place], [address] Phone: [phone]   Fax:  [phone]  Name: Hannah Wallace MRN: [id number] Date of Birth: Mar 26, 1956

## 2020-04-16 ENCOUNTER — Other Ambulatory Visit: Payer: Self-pay

## 2020-04-16 ENCOUNTER — Ambulatory Visit: Payer: BC Managed Care – PPO

## 2020-04-16 DIAGNOSIS — M25512 Pain in left shoulder: Secondary | ICD-10-CM

## 2020-04-16 DIAGNOSIS — C50412 Malignant neoplasm of upper-outer quadrant of left female breast: Secondary | ICD-10-CM | POA: Diagnosis not present

## 2020-04-16 DIAGNOSIS — Z483 Aftercare following surgery for neoplasm: Secondary | ICD-10-CM

## 2020-04-16 DIAGNOSIS — I890 Lymphedema, not elsewhere classified: Secondary | ICD-10-CM

## 2020-04-16 DIAGNOSIS — R600 Localized edema: Secondary | ICD-10-CM

## 2020-04-16 DIAGNOSIS — G8929 Other chronic pain: Secondary | ICD-10-CM

## 2020-04-16 DIAGNOSIS — M25612 Stiffness of left shoulder, not elsewhere classified: Secondary | ICD-10-CM

## 2020-04-16 DIAGNOSIS — R293 Abnormal posture: Secondary | ICD-10-CM

## 2020-04-16 NOTE — Therapy (Signed)
[place], [place], [address] Phone: [phone]   Fax:  [phone]  Physical Therapy Discharge Note  Patient Details  Name: Hannah Wallace MRN: [id number] Date of Birth: July 08, 1956 Referring Provider (PT): Shona Simpson PA   Encounter Date: 04/16/2020   PT End of Session - 04/16/20 0809    Visit Number 9    Number of Visits 9    Date for PT Re-Evaluation 04/21/20    PT Start Time 0806    PT Stop Time 0910    PT Time Calculation (min) 64 min    Activity Tolerance Patient tolerated treatment well    Behavior During Therapy [facility] for tasks assessed/performed           Past Medical History:  Diagnosis Date  . Basal cell carcinoma    on back  . Cancer [facility])    breast cancer    Past Surgical History:  Procedure Laterality Date  . BREAST LUMPECTOMY WITH RADIOACTIVE SEED AND SENTINEL LYMPH NODE BIOPSY Left 09/25/2019   Procedure: LEFT BREAST LUMPECTOMY WITH RADIOACTIVE SEED AND SENTINEL LYMPH NODE BIOPSY;  Surgeon: Stark Klein, MD;  Location: [place];  Service: General;  Laterality: Left;  . COLONOSCOPY    . PORT-A-CATH REMOVAL N/A 03/16/2020   Procedure: REMOVAL PORT-A-CATH;  Surgeon: Stark Klein, MD;  Location: [place];  Service: General;  Laterality: N/A;  . PORTACATH PLACEMENT Left 11/19/2019   Procedure: INSERTION PORT-A-CATH;  Surgeon: Stark Klein, MD;  Location: [place];  Service: General;  Laterality: Left;    There were no vitals filed for this visit.   Subjective Assessment - 04/16/20 0809    Subjective Pt states that she was gardening yesterday and wheeled multiple wheel barrels of stuff to the woods. She states that she has noticed a raised area under the compression in the L breast on the lateral aspect that does not go down with the compression.    Pertinent History L breast cancer with radiation and chemotherapy. She is done with chemotherapy  and currently has 1 more week of radiation.    Patient Stated Goals I want to be able to bear weight through my arm and not be afraid and to decrease the pain.    Currently in Pain? Yes    Pain Score 1     Pain Location Axilla    Pain Orientation Left    Pain Descriptors / Indicators Aching    Pain Type Acute pain    Pain Onset More than a month ago    Aggravating Factors  when she moves her arm. She was gardening yesterday and states it feels like sore muscle    Pain Relieving Factors rest    Effect of Pain on Daily Activities None                 LYMPHEDEMA/ONCOLOGY QUESTIONNAIRE - 04/16/20 0001      Left Upper Extremity Lymphedema   15 cm Proximal to Olecranon Process 32.7 cm    10 cm Proximal to Olecranon Process 31.5 cm    Olecranon Process 27.3 cm    15 cm Proximal to Ulnar Styloid Process 26.5 cm    10 cm Proximal to Ulnar Styloid Process 23.7 cm    Just Proximal to Ulnar Styloid Process 17.3 cm    Across Hand at [employer] 18.7 cm    At [place] of 2nd Digit 6.3 cm    Other axillary line 104.3  cm     Other at nipple line 105.5 cm                 Outpatient Rehab from 03/03/2020 in [place]  Lymphedema Life Impact Scale Total Score 13.24 %            [facility] Adult PT Treatment/Exercise - 04/16/20 0001      Manual Therapy   Manual Therapy Myofascial release;Manual Lymphatic Drainage (MLD);Soft tissue mobilization    Soft tissue mobilization Plapable tightness/tenderness noted at the L pectoralis major, deltoid and latissimus dorsi; decreased moderately after light STM    Myofascial Release easy myofascial release in the L axilla area and along the L lateral trunk prior to MLD    Manual Lymphatic Drainage (MLD) MLD performed in supine: short neck, swimming in the terminus, bil axillary and L inguinal nodes, anterior inter-axillary anastomosis, L axillo-inguinal anastomosis, superior/inferior breast toward corresponding  anastomosis, extra time spent on the inter-axillary anastomosis following myofascial release and then some extra time spent on the L axillo-inguinal anastomosis. Pt continues with improvement. Possible small seroma noted at the L lateral breast she will ask her surgeon about it at her next appt                  PT Education - 04/16/20 1214    Education Details Pt was educated on discharge today. Discussed possible small seroma and to ask her surgeon about it at her next appt. She will continue to perform MLD, wear compression and perform exercises at home.    Person(s) Educated Patient    Methods Explanation    Comprehension Verbalized understanding            PT Short Term Goals - 04/16/20 0814      PT SHORT TERM GOAL #1   Title Pt will be independent with home HEP and MLD within 2 weeks in order to demonstrate autonomy of care.    Baseline Pt states that she has been performing MLD and HEp at home.    Status Achieved             PT Long Term Goals - 04/16/20 0814      PT LONG TERM GOAL #1   Title Pt will demonstrate 150 degrees L shoulder flexion and ROM within 4 weeks in order to demonstrate functional improvements in L shoulder ROM ROM.    Baseline L shoulder flexion: 161, L shoulder abduction: 150    Status Achieved      PT LONG TERM GOAL #2   Title Pt will report 1/10 or less pain in the L shoulder to demonstrate a subjective improvement in quality of life with use of her LUE.    Baseline 1/10    Status Achieved      PT LONG TERM GOAL #3   Title Pt will demonstrate 1 cm decrease in circumferential measurements of the L brachium within 4 weeks to demonstrate decreased fluid.    Baseline see measurements pt has lost 1 cm in her chest/breast area and in her upper brachium    Status Achieved      PT LONG TERM GOAL #4   Title Pt will be fitted with appropriate compression garment within 4 weeks for home management of lymphedema.    Baseline pt currently has not  ordered a compression sleeve. but has a compression bra she wears with 1/2 inch gray foam.    Status Partially Met  Plan - 04/16/20 0807    Clinical Impression Statement Pt presents to physical therapy wtih improved edema in the L anterior chest wall; she continues with edema in the L breast with a small raised area under compression bra and foam. Pt was educated there is the possibility of a small seroma and to ask her MD at her next appt. Discussed managing with what we have been doing including compression, MLD and exercise. MLD was performed for the L breast this session following easy STM to the L pectoralis major, deltoid and latissimus dorsi wing in order to decrease risk for increased swelling to facilitate fluid flow out of the area. Pt will be discharged at this time.    Personal Factors and Comorbidities Comorbidity 2    Comorbidities Lumpectomy with 5 lymph node removal and radiation on the L    Rehab Potential Good    PT Frequency 2x / week    PT Duration 4 weeks    PT Treatment/Interventions Moist Heat;Therapeutic exercise;Neuromuscular re-education;Manual techniques;Therapeutic activities    PT Next Visit Plan Pt will be discharged today    PT Home Exercise Plan see pt instructions    Consulted and Agree with Plan of Care Patient           Patient will benefit from skilled therapeutic intervention in order to improve the following deficits and impairments:  Increased edema, Postural dysfunction, Decreased range of motion, Pain, Decreased knowledge of precautions  Visit Diagnosis: Malignant neoplasm of upper-outer quadrant of left breast in female, estrogen receptor positive (HCC)  Abnormal posture  Stiffness of left shoulder, not elsewhere classified  Chronic left shoulder pain  Lymphedema, not elsewhere classified  Localized edema  Aftercare following surgery for neoplasm     Problem List Patient Active Problem List   Diagnosis Date  Noted  . Port-A-Cath in place 11/20/2019  . Malignant neoplasm of upper-outer quadrant of left breast in female, estrogen receptor positive ([facility]) 09/09/2019   PHYSICAL THERAPY DISCHARGE SUMMARY  Plan: Patient agrees to discharge.  Patient goals were partially met. Patient is being discharged due to being pleased with the current functional level.  ?????       Ander Purpura, PT 04/16/2020, 12:19 PM  [place], [place], [address] Phone: [phone]   Fax:  [phone]  Name: Hannah Wallace MRN: [id number] Date of Birth: 01-16-1956

## 2020-05-03 ENCOUNTER — Telehealth: Payer: Self-pay | Admitting: Radiation Oncology

## 2020-05-03 ENCOUNTER — Encounter (INDEPENDENT_AMBULATORY_CARE_PROVIDER_SITE_OTHER): Payer: Self-pay

## 2020-05-03 NOTE — Telephone Encounter (Signed)
  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  Interval Since Last Radiation:  8 weeks   02/16/20-03/08/20:  The left breast was treated to 42.56 Gy in 16 fractions followed by an 8 Gy boost in 4 fractions.   Narrative:  The patient was contacted today for routine follow-up. During treatment she did very well with radiotherapy and did not have significant desquamation. She reports she is still having a small amount of discoloration under the underarm and breast. She is still having swelling   Impression/Plan: 1. 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 The patient has been doing well since completion of radiotherapy. We discussed that we would be happy to continue to follow her as needed, but she will also continue to follow up with Dr. Lindi Adie in medical oncology. She was counseled on skin care as well as measures to avoid sun exposure to this area and we also discussed that she would follow up with Dr. Barry Dienes regarding her breast edema and that she could resume swimming. 2. Survivorship. We discussed the importance of survivorship evaluation and encouraged her to attend her upcoming visit with that clinic.    Carola Rhine, PAC

## 2020-05-11 ENCOUNTER — Other Ambulatory Visit: Payer: Self-pay | Admitting: *Deleted

## 2020-05-11 ENCOUNTER — Other Ambulatory Visit: Payer: Self-pay | Admitting: General Surgery

## 2020-05-11 DIAGNOSIS — Z853 Personal history of malignant neoplasm of breast: Secondary | ICD-10-CM

## 2020-06-01 ENCOUNTER — Telehealth: Payer: Self-pay | Admitting: Medical Oncology

## 2020-06-01 NOTE — Telephone Encounter (Signed)
[id number]: phone call Call to patient to inquire as to whether she would be available to complete her 24 week study assessment visit when in clinic on the 5th of August appt. with NP. Patient states that she was under the impression that her NP visit was a virtual one, but confirms that she is able to come in for NP appt and can complete study visit. Patient asking for study visit to be scheduled before NP visit, at 3pm. I informed patient that I will confirm with scheduling that this is an in-clinic visit and will call her back to confirm. Patient will be scheduled for research visit at 3pm August 5th. Patient thanked, encouraged to call in the meantime.  Maxwell Marion, RN, BSN, [facility] Clinical Research 06/01/2020 4:17 PM

## 2020-06-03 ENCOUNTER — Telehealth: Payer: Self-pay | Admitting: Medical Oncology

## 2020-06-03 NOTE — Telephone Encounter (Signed)
[id number]: phone-call LVMOM with patient following up with regarding her 8/5 visit with NP. I confirmed with patient that this is a clinic visit, not a virtual visit. I informed patient that I will be scheduling her appt for study assessments at 3PM per her request, same day, as discussed in our previous conversation. Patient thanked and encouraged to call with questions.  Maxwell Marion, RN, BSN, [facility] Clinical Research 06/03/2020 12:01 PM

## 2020-06-07 ENCOUNTER — Encounter (INDEPENDENT_AMBULATORY_CARE_PROVIDER_SITE_OTHER): Payer: Self-pay

## 2020-06-07 ENCOUNTER — Telehealth: Payer: Self-pay | Admitting: Licensed Clinical Social Worker

## 2020-06-07 ENCOUNTER — Telehealth: Payer: Self-pay | Admitting: Medical Oncology

## 2020-06-07 NOTE — Telephone Encounter (Signed)
[id number]- phone call Spoke with patient regarding 24 weeks study visit assessment and we made plans for this visit to occur August 11th at 2 PM. Patient thanked for her time and encouraged to call with questions in the meantime. This visit has been scheduled as a research appointment with this research nurse and should be available on [place]. Maxwell Marion, RN, BSN, [facility] Clinical Research 06/07/2020 3:42 PM

## 2020-06-07 NOTE — Telephone Encounter (Signed)
[place] Work  Clinical Social Work was referred by Wilber Bihari, NP for assessment of psychosocial needs for patient in survivorship.  Clinical Social Worker contacted patient by phone  to offer support and assess for needs.  Patient is experiencing lingering side effects from treatment, some of which she feels she can handle as they are similar but less intense than menopause. However, she is also working on forgiving herself for not getting information earlier (ie: not getting compression bra before surgery) and is trying to get back on track with exercise and eating well.  She is interested in 1-2 sessions with this CSW (prior to deciding on referral for counseling), taking part in support group, and attending [facility].      Yailine Ballard, Edwinna Areola, [place]

## 2020-06-10 ENCOUNTER — Inpatient Hospital Stay: Payer: BC Managed Care – PPO | Admitting: Adult Health

## 2020-06-10 ENCOUNTER — Telehealth: Payer: Self-pay | Admitting: Licensed Clinical Social Worker

## 2020-06-10 NOTE — Progress Notes (Deleted)
SURVIVORSHIP VISIT:   BRIEF ONCOLOGIC HISTORY:  Oncology History  Malignant neoplasm of upper-outer quadrant of left breast in female, estrogen receptor positive ([place])  09/03/2019 Initial Diagnosis   Routine screening mammogram detected a 1.4cm indeterminate left breast mass. US showed a 1.0cm left breast mass and a 0.5cm lymph node with cortical thickening at the 2:00 position in the left breast. 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   09/10/2019 Cancer Staging   Staging form: Breast, AJCC 8th Edition - Clinical stage from 09/10/2019: Stage IA (cT1c, cN0, cM0, G2, ER+, PR+, HER2-)   09/25/2019 Surgery   Left lumpectomy Barry Dienes) [phone]): IDC, grade 2, 1.2cm, with intermediate grade DCIS, 5 axillary lymph nodes negative, clear margins.    09/25/2019 Cancer Staging   Staging form: Breast, AJCC 8th Edition - Pathologic stage from 09/25/2019: Stage IA (pT1c, pN0, cM0, G2, ER+, PR+, HER2-)   11/03/2019 Oncotype testing   The Oncotype DX score was 26 predicting a risk of outside the breast recurrence over the next 9 years of 16% if the patient's only systemic therapy is tamoxifen for 5 years.    11/20/2019 - 01/22/2020 Chemotherapy   dexamethasone (DECADRON) 4 MG tablet, 4 mg (100 % of original dose 4 mg), Oral, Daily, 1 of 1 cycle, Start date: 11/06/2019, End date: 12/11/2019. Dose modification: 4 mg (original dose 4 mg, Cycle 0)  palonosetron (ALOXI) injection 0.25 mg, 0.25 mg, Intravenous,  Once, 4 of 4 cycles. Administration: 0.25 mg (11/20/2019), 0.25 mg (12/11/2019), 0.25 mg (01/01/2020), 0.25 mg (01/22/2020)  pegfilgrastim-jmdb (FULPHILA) injection 6 mg, 6 mg, Subcutaneous,  Once, 4 of 4 cycles. Administration: 6 mg (11/22/2019), 6 mg (12/13/2019), 6 mg (01/03/2020), 6 mg (01/24/2020)  cyclophosphamide (CYTOXAN) 1,260 mg in sodium chloride 0.9 % 250 mL chemo infusion, 600 mg/m2 = 1,260 mg, Intravenous,  Once, 4 of 4  cycles. Administration: 1,260 mg (11/20/2019), 1,260 mg (12/11/2019), 1,260 mg (01/01/2020), 1,260 mg (01/22/2020)  DOCEtaxel (TAXOTERE) 160 mg in sodium chloride 0.9 % 250 mL chemo infusion, 75 mg/m2 = 160 mg, Intravenous,  Once, 4 of 4 cycles. Administration: 160 mg (11/20/2019), 160 mg (12/11/2019), 160 mg (01/01/2020), 160 mg (01/22/2020).   02/16/2020 - 03/12/2020 Radiation Therapy   The patient initially received a dose of 42.56 Gy in 16 fractions to the breast using whole-breast tangent fields. This was delivered using a 3-D conformal technique. The patient then received a boost to the seroma. This delivered an additional 10 Gy in 66factions using an en face electron field due to the depth of the seroma. The total dose was 52.56Gy.   04/2020 - 04/2027 Anti-estrogen oral therapy   Anastrozole     INTERVAL HISTORY:  Ms. HHostetterto review her survivorship care plan detailing her treatment course for breast cancer, as well as monitoring long-term side effects of that treatment, education regarding health maintenance, screening, and overall wellness and health promotion.     Overall, Hannah Wallace reports feeling quite well   REVIEW OF SYSTEMS:  Review of Systems  Constitutional: Negative for appetite change, chills, fatigue, fever and unexpected weight change.  HENT:   Negative for hearing loss, lump/mass and sore throat.   Eyes: Negative for eye problems and icterus.  Respiratory: Negative for chest tightness, cough and shortness of breath.   Cardiovascular: Negative for chest pain, leg swelling and palpitations.  Gastrointestinal: Negative for abdominal distention, abdominal pain, constipation, diarrhea, nausea and vomiting.  Endocrine: Negative for hot flashes.  Genitourinary: Negative for difficulty urinating.   Musculoskeletal: Negative for arthralgias.  Skin: Negative for itching and rash.  Neurological: Negative for dizziness, extremity weakness, headaches and numbness.  Hematological:  Negative for adenopathy. Does not bruise/bleed easily.  Psychiatric/Behavioral: Negative for depression. The patient is not nervous/anxious.    Breast: Denies any new nodularity, masses, tenderness, nipple changes, or nipple discharge.      ONCOLOGY TREATMENT TEAM:  1. Surgeon:  Dr. Barry Dienes at [facility] 2. Medical Oncologist: Dr. Lindi Adie  3. Radiation Oncologist: Dr. Lisbeth Renshaw    PAST MEDICAL/SURGICAL HISTORY:  Past Medical History:  Diagnosis Date  . Basal cell carcinoma    on back  . Cancer [facility])    breast cancer   Past Surgical History:  Procedure Laterality Date  . BREAST LUMPECTOMY WITH RADIOACTIVE SEED AND SENTINEL LYMPH NODE BIOPSY Left 09/25/2019   Procedure: LEFT BREAST LUMPECTOMY WITH RADIOACTIVE SEED AND SENTINEL LYMPH NODE BIOPSY;  Surgeon: Stark Klein, MD;  Location: [place];  Service: General;  Laterality: Left;  . COLONOSCOPY    . PORT-A-CATH REMOVAL N/A 03/16/2020   Procedure: REMOVAL PORT-A-CATH;  Surgeon: Stark Klein, MD;  Location: [place];  Service: General;  Laterality: N/A;  . PORTACATH PLACEMENT Left 11/19/2019   Procedure: INSERTION PORT-A-CATH;  Surgeon: Stark Klein, MD;  Location: [place];  Service: General;  Laterality: Left;     ALLERGIES:  No Known Allergies   CURRENT MEDICATIONS:  Outpatient Encounter Medications as of 06/10/2020  Medication Sig  . anastrozole (ARIMIDEX) 1 MG tablet Take 1 tablet (1 mg total) by mouth daily.  Marland Kitchen b complex-C-folic acid 1 MG capsule Take 1 capsule by mouth daily.  . Omega-3 Fatty Acids (FISH OIL) 1000 MG CAPS Take 1,000 mg by mouth 2 (two) times daily.  [name] Glycol-Propyl Glycol (SYSTANE) 0.4-0.3 % SOLN Place 1 drop into both eyes daily.  . Vitamin D-Vitamin K (VITAMIN K2-VITAMIN D3 PO) Take 1 tablet by mouth daily. D3 125 mcg K2 90 mcg  . vitamin E 180 MG (400 UNITS) capsule Take 400 Units by mouth daily.   No facility-administered encounter medications on  file as of 06/10/2020.     ONCOLOGIC FAMILY HISTORY:  Family History  Problem Relation Age of Onset  . Lung cancer Father   . Hodgkin's lymphoma Sister   . Breast cancer Paternal Grandmother      GENETIC COUNSELING/TESTING: ***  SOCIAL HISTORY:  Social History   Socioeconomic History  . Marital status: Single    Spouse name: Not on file  . Number of children: Not on file  . Years of education: Not on file  . Highest education level: Not on file  Occupational History  . Not on file  Tobacco Use  . Smoking status: Never Smoker  . Smokeless tobacco: Never Used  Vaping Use  . Vaping Use: Never used  Substance and Sexual Activity  . Alcohol use: Yes    Comment: 4x a year  . Drug use: Never  . Sexual activity: Not on file  Other Topics Concern  . Not on file  Social History Narrative  . Not on file   Social Determinants of Health   Financial Resource Strain:   . Difficulty of Paying Living Expenses:   Food Insecurity:   . Worried About Charity fundraiser in the Last Year:   . Arboriculturist in the Last Year:   Transportation Needs:   .  Lack of Transportation (Medical):   Marland Kitchen Lack of Transportation (Non-Medical):   Physical Activity:   . Days of Exercise per Week:   . Minutes of Exercise per Session:   Stress:   . Feeling of Stress :   Social Connections:   . Frequency of Communication with Friends and Family:   . Frequency of Social Gatherings with Friends and Family:   . Attends Religious Services:   . Active Member of Clubs or Organizations:   . Attends Archivist Meetings:   Marland Kitchen Marital Status:   Intimate Partner Violence:   . Fear of Current or Ex-Partner:   . Emotionally Abused:   Marland Kitchen Physically Abused:   . Sexually Abused:      OBSERVATIONS/OBJECTIVE:  There were no vitals taken for this visit. GENERAL: Patient is a well appearing female in no acute distress HEENT:  Sclerae anicteric.  Oropharynx clear and moist. No ulcerations or  evidence of oropharyngeal candidiasis. Neck is supple.  NODES:  No cervical, supraclavicular, or axillary lymphadenopathy palpated.  BREAST EXAM:  Deferred. LUNGS:  Clear to auscultation bilaterally.  No wheezes or rhonchi. HEART:  Regular rate and rhythm. No murmur appreciated. ABDOMEN:  Soft, nontender.  Positive, normoactive bowel sounds. No organomegaly palpated. MSK:  No focal spinal tenderness to palpation. Full range of motion bilaterally in the upper extremities. EXTREMITIES:  No peripheral edema.   SKIN:  Clear with no obvious rashes or skin changes. No nail dyscrasia. NEURO:  Nonfocal. Well oriented.  Appropriate affect.    LABORATORY DATA:  None for this visit.  DIAGNOSTIC IMAGING:  None for this visit.      ASSESSMENT AND PLAN:  Ms.. Hannah Wallace is a pleasant 64 y.o. female with Stage IA left breast invasive ductal carcinoma, ER+/PR+/HER2-, diagnosed in 08/2019, treated with lumpectomy, adjuvant chemotherapy, adjuvant radiation therapy, and anti-estrogen therapy with Anastrozole beginning in 04/2020.  She presents to the Survivorship Clinic for our initial meeting and routine follow-up post-completion of treatment for breast cancer.    1. Stage IA left breast cancer:  Ms. Knighton is continuing to recover from definitive treatment for breast cancer. She will follow-up with her medical oncologist, Dr. Ross Ludwig in *** with history and physical exam per surveillance protocol.  She will continue her anti-estrogen therapy with Anastrozole. Thus far, she is tolerating the Anastrozole well, with minimal side effects. She was instructed to make Dr. Lindi Adie or myself aware if she begins to experience any worsening side effects of the medication and I could see her back in clinic to help manage those side effects, as needed. Her mammogram is due 09/2020; orders placed today. Today, a comprehensive survivorship care plan and treatment summary was reviewed with the patient today  detailing her breast cancer diagnosis, treatment course, potential late/long-term effects of treatment, appropriate follow-up care with recommendations for the future, and patient education resources.  A copy of this summary, along with a letter will be sent to the patient's primary care provider via mail/fax/In Basket message after today's visit.    #. Problem(s) at Visit______________  #. Bone health:  Given Ms. Hassing's age/history of breast cancer and her current treatment regimen including anti-estrogen therapy with Anastrozole, she is at risk for bone demineralization.  Her last DEXA scan was ***, which showed ***.  In the meantime, she was encouraged to increase her consumption of foods rich in calcium, as well as increase her weight-bearing activities.  She was given education on specific activities to promote bone health.  #.  Cancer screening:  Due to Ms. Packard's history and her age, she should receive screening for skin cancers, colon cancer, and gynecologic cancers.  The information and recommendations are listed on the patient's comprehensive care plan/treatment summary and were reviewed in detail with the patient.    #. Health maintenance and wellness promotion: Ms. Colter was encouraged to consume 5-7 servings of fruits and vegetables per day. We reviewed the "Nutrition Rainbow" handout, as well as the handout "Take Control of Your Health and Reduce Your Cancer Risk" from the [place].  She was also encouraged to engage in moderate to vigorous exercise for 30 minutes per day most days of the week. We discussed the [employer] fitness program, which is designed for cancer survivors to help them become more physically fit after cancer treatments.  She was instructed to limit her alcohol consumption and continue to abstain from tobacco use/***was encouraged stop smoking.     #. Support services/counseling: It is not uncommon for this period of the patient's cancer care  trajectory to be one of many emotions and stressors.  We discussed how this can be increasingly difficult during the times of quarantine and social distancing due to the COVID-19 pandemic.   She was given information regarding our available services and encouraged to contact me with any questions or for help enrolling in any of our support group/programs.    Follow up instructions:    -Return to cancer center ***  -Mammogram due in *** -Follow up with surgery *** -She is welcome to return back to the Survivorship Clinic at any time; no additional follow-up needed at this time.  -Consider referral back to survivorship as a long-term survivor for continued surveillance  The patient was provided an opportunity to ask questions and all were answered. The patient agreed with the plan and demonstrated an understanding of the instructions.   Total encounter time: *** minutes  Wilber Bihari, NP 06/10/20 9:28 AM Medical Oncology and Hematology [facility] [place], [place] [address] Tel. [phone]    Fax. [phone]   *Total Encounter Time as defined by the Centers for Medicare and Medicaid Services includes, in addition to the face-to-face time of a patient visit (documented in the note above) non-face-to-face time: obtaining and reviewing outside history, ordering and reviewing medications, tests or procedures, care coordination (communications with other health care professionals or caregivers) and documentation in the medical record.

## 2020-06-10 NOTE — Telephone Encounter (Signed)
Left message for patient to verify my chart video visit pre reg

## 2020-06-11 ENCOUNTER — Inpatient Hospital Stay: Payer: BC Managed Care – PPO | Attending: Hematology and Oncology | Admitting: Licensed Clinical Social Worker

## 2020-06-11 DIAGNOSIS — H5789 Other specified disorders of eye and adnexa: Secondary | ICD-10-CM | POA: Insufficient documentation

## 2020-06-11 DIAGNOSIS — Z807 Family history of other malignant neoplasms of lymphoid, hematopoietic and related tissues: Secondary | ICD-10-CM | POA: Insufficient documentation

## 2020-06-11 DIAGNOSIS — Z79811 Long term (current) use of aromatase inhibitors: Secondary | ICD-10-CM | POA: Insufficient documentation

## 2020-06-11 DIAGNOSIS — Z801 Family history of malignant neoplasm of trachea, bronchus and lung: Secondary | ICD-10-CM | POA: Insufficient documentation

## 2020-06-11 DIAGNOSIS — Z923 Personal history of irradiation: Secondary | ICD-10-CM | POA: Insufficient documentation

## 2020-06-11 DIAGNOSIS — Z9221 Personal history of antineoplastic chemotherapy: Secondary | ICD-10-CM | POA: Insufficient documentation

## 2020-06-11 DIAGNOSIS — G4700 Insomnia, unspecified: Secondary | ICD-10-CM | POA: Insufficient documentation

## 2020-06-11 DIAGNOSIS — R5383 Other fatigue: Secondary | ICD-10-CM | POA: Insufficient documentation

## 2020-06-11 DIAGNOSIS — Z79899 Other long term (current) drug therapy: Secondary | ICD-10-CM | POA: Insufficient documentation

## 2020-06-11 DIAGNOSIS — F419 Anxiety disorder, unspecified: Secondary | ICD-10-CM | POA: Insufficient documentation

## 2020-06-11 DIAGNOSIS — C50412 Malignant neoplasm of upper-outer quadrant of left female breast: Secondary | ICD-10-CM | POA: Insufficient documentation

## 2020-06-11 DIAGNOSIS — Z170 Estrogen receptor positive status [ER+]: Secondary | ICD-10-CM | POA: Insufficient documentation

## 2020-06-11 DIAGNOSIS — Z803 Family history of malignant neoplasm of breast: Secondary | ICD-10-CM | POA: Insufficient documentation

## 2020-06-11 NOTE — Progress Notes (Signed)
[facility] CSW Progress Note  Holiday representative met with patient via virtual visit for adjustment counseling. Patient's main concern is becoming more angry and stressed than previously over "small things". She will feel hot and mad and become short with people or avoid the situation. This is similar to (although less intense) as when she went through menopause. Goal is to be able to manage reactions and to begin feeling happier.  Many stressors this past year with cancer dx and tx, brother dying unexpectedly, mom in nursing home, and work stress (retiring in September). She is getting back into the routine of walking each morning which is helping. Main stress time is around 5pm when she feels more anxious and then overeats. CSW worked with patient on identifying triggers as well as potential coping skills and stress reduction activities. Made a plan to implement a healthy after-work routine.    Plan:  Patient will continue to walk 30 minutes at least 5 days a week.   Patient will start after-work routine including journaling/ gratitude practice, deep breathing, and/or going outside.  She will use deep breathing and enjoyable scent when high stress triggers occur.  Attend next breast cancer support group virtually on 8/17  Follow up: 07/02/2020   Total time: 50 minutes  Rodrigues Urbanek E Sonji Starkes LCSW, LCSW

## 2020-06-15 ENCOUNTER — Encounter (INDEPENDENT_AMBULATORY_CARE_PROVIDER_SITE_OTHER): Payer: Self-pay

## 2020-06-16 ENCOUNTER — Encounter: Payer: Self-pay | Admitting: Medical Oncology

## 2020-06-16 ENCOUNTER — Inpatient Hospital Stay: Payer: BC Managed Care – PPO | Admitting: Medical Oncology

## 2020-06-16 ENCOUNTER — Inpatient Hospital Stay (HOSPITAL_BASED_OUTPATIENT_CLINIC_OR_DEPARTMENT_OTHER): Payer: BC Managed Care – PPO | Admitting: Adult Health

## 2020-06-16 ENCOUNTER — Other Ambulatory Visit: Payer: Self-pay

## 2020-06-16 VITALS — BP 123/80 | HR 94 | Temp 98.6°F | Resp 17 | Ht 69.0 in | Wt 205.5 lb

## 2020-06-16 DIAGNOSIS — R5383 Other fatigue: Secondary | ICD-10-CM | POA: Diagnosis not present

## 2020-06-16 DIAGNOSIS — C50412 Malignant neoplasm of upper-outer quadrant of left female breast: Secondary | ICD-10-CM

## 2020-06-16 DIAGNOSIS — G4700 Insomnia, unspecified: Secondary | ICD-10-CM | POA: Diagnosis not present

## 2020-06-16 DIAGNOSIS — Z923 Personal history of irradiation: Secondary | ICD-10-CM | POA: Diagnosis not present

## 2020-06-16 DIAGNOSIS — Z170 Estrogen receptor positive status [ER+]: Secondary | ICD-10-CM | POA: Diagnosis not present

## 2020-06-16 DIAGNOSIS — Z9221 Personal history of antineoplastic chemotherapy: Secondary | ICD-10-CM | POA: Diagnosis not present

## 2020-06-16 DIAGNOSIS — Z801 Family history of malignant neoplasm of trachea, bronchus and lung: Secondary | ICD-10-CM | POA: Diagnosis not present

## 2020-06-16 DIAGNOSIS — Z79811 Long term (current) use of aromatase inhibitors: Secondary | ICD-10-CM | POA: Diagnosis not present

## 2020-06-16 DIAGNOSIS — F419 Anxiety disorder, unspecified: Secondary | ICD-10-CM | POA: Diagnosis not present

## 2020-06-16 DIAGNOSIS — Z79899 Other long term (current) drug therapy: Secondary | ICD-10-CM | POA: Diagnosis not present

## 2020-06-16 DIAGNOSIS — Z803 Family history of malignant neoplasm of breast: Secondary | ICD-10-CM | POA: Diagnosis not present

## 2020-06-16 DIAGNOSIS — H5789 Other specified disorders of eye and adnexa: Secondary | ICD-10-CM | POA: Diagnosis not present

## 2020-06-16 DIAGNOSIS — Z807 Family history of other malignant neoplasms of lymphoid, hematopoietic and related tissues: Secondary | ICD-10-CM | POA: Diagnosis not present

## 2020-06-16 NOTE — Progress Notes (Signed)
A0045, A PROSPECTIVE OBSERVATIONAL COHORT STUDY TO DEVELOP A PREDICTIVE MODEL OF TAXANE-INDUCED PERIPHERAL NEUROPATHY IN CANCER PATIENTS.  24  weeks Visit. Patient presented to the clinic, alone, for today's clinic appointments. I met with patient upon arrival and provided her with study questionnaires for this visit. Patient confirms having no neuropathy symptoms to either hands or feet. Patient and I reviewed her medication list together, and patient confirms to not be taking anything for neuropathy. Patient denies having any new or concerning issues and confirms to be doing well. At today's visit, I informed patient of the study's recent revision 5, which does not require a re-consent, but study does want [place] to inform participants that enrollment for study participation has increased from 1000 to approximately 1310. Patient gave her verbal understand to this and denied having any questions.   PROs: Questionnaires were given to patient to complete in clinic. Collected questionnaires and checked for completeness and accuracy.  Labs: Patient did not consent to the optional blood collection.  Physician Assessments: CTCAE and Treatment Burden assessed and forms completed and signed by NP Wilber Bihari and cosigned by Dr. Lindi Adie. Patient denies having any issues or concerns today.  Treatment: patient has completed chemotherapy treatments on 01/22/20 and radiation treatment on 03/12/20. History of Falls: patient confirms no falls.    Solicited Neuropathy Events: patient denies having any neuropathy S/S.  Assessment for Interventions for CIPN: Reviewed with patient and CRFs completed. Neuropen Assessment: Completed per protocol by this certified research nurse with recording by Carol Ada, [place] Assessment: Completed per protocol by this certified research nurse, time and documentation completed by Carol Ada, [place]. Timed Get Up and Go Test: completed and documented at 7.2 seconds. Plan:  Informed patient of next study assessments at approximately 52 weeks (time window of visit from end of December to mid January 2021) and I will notify her a few weeks ahead of date due. Patient denied having any questions at this time. Patient thanked for her time and continued support of study and was encouraged to call me for any questions or concerns she may have prior to her next appointment.  Maxwell Marion, RN, BSN, [facility] Clinical Research 06/16/2020 2:45 PM

## 2020-06-17 ENCOUNTER — Telehealth: Payer: Self-pay | Admitting: Adult Health

## 2020-06-17 NOTE — Telephone Encounter (Signed)
No 8/11 los. No changes made to pt's schedule.  

## 2020-06-18 NOTE — Progress Notes (Signed)
SURVIVORSHIP VISIT:    BRIEF ONCOLOGIC HISTORY:  Oncology History  Malignant neoplasm of upper-outer quadrant of left breast in female, estrogen receptor positive ([place])  09/03/2019 Initial Diagnosis   Routine screening mammogram detected a 1.4cm indeterminate left breast mass. US showed a 1.0cm left breast mass and a 0.5cm lymph node with cortical thickening at the 2:00 position in the left breast. 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   09/10/2019 Cancer Staging   Staging form: Breast, AJCC 8th Edition - Clinical stage from 09/10/2019: Stage IA (cT1c, cN0, cM0, G2, ER+, PR+, HER2-)   09/25/2019 Surgery   Left lumpectomy Barry Dienes) [phone]): IDC, grade 2, 1.2cm, with intermediate grade DCIS, 5 axillary lymph nodes negative, clear margins.    09/25/2019 Cancer Staging   Staging form: Breast, AJCC 8th Edition - Pathologic stage from 09/25/2019: Stage IA (pT1c, pN0, cM0, G2, ER+, PR+, HER2-)   11/03/2019 Oncotype testing   The Oncotype DX score was 26 predicting a risk of outside the breast recurrence over the next 9 years of 16% if the patient's only systemic therapy is tamoxifen for 5 years.    11/20/2019 - 01/22/2020 Chemotherapy   dexamethasone (DECADRON) 4 MG tablet, 4 mg (100 % of original dose 4 mg), Oral, Daily, 1 of 1 cycle, Start date: 11/06/2019, End date: 12/11/2019. Dose modification: 4 mg (original dose 4 mg, Cycle 0)  palonosetron (ALOXI) injection 0.25 mg, 0.25 mg, Intravenous,  Once, 4 of 4 cycles. Administration: 0.25 mg (11/20/2019), 0.25 mg (12/11/2019), 0.25 mg (01/01/2020), 0.25 mg (01/22/2020)  pegfilgrastim-jmdb (FULPHILA) injection 6 mg, 6 mg, Subcutaneous,  Once, 4 of 4 cycles. Administration: 6 mg (11/22/2019), 6 mg (12/13/2019), 6 mg (01/03/2020), 6 mg (01/24/2020)  cyclophosphamide (CYTOXAN) 1,260 mg in sodium chloride 0.9 % 250 mL chemo infusion, 600 mg/m2 = 1,260 mg, Intravenous,  Once, 4 of 4  cycles. Administration: 1,260 mg (11/20/2019), 1,260 mg (12/11/2019), 1,260 mg (01/01/2020), 1,260 mg (01/22/2020)  DOCEtaxel (TAXOTERE) 160 mg in sodium chloride 0.9 % 250 mL chemo infusion, 75 mg/m2 = 160 mg, Intravenous,  Once, 4 of 4 cycles. Administration: 160 mg (11/20/2019), 160 mg (12/11/2019), 160 mg (01/01/2020), 160 mg (01/22/2020).   02/16/2020 - 03/12/2020 Radiation Therapy   The patient initially received a dose of 42.56 Gy in 16 fractions to the breast using whole-breast tangent fields. This was delivered using a 3-D conformal technique. The patient then received a boost to the seroma. This delivered an additional 10 Gy in 71factions using an en face electron field due to the depth of the seroma. The total dose was 52.56Gy.   04/2020 - 04/2027 Anti-estrogen oral therapy   Anastrozole     INTERVAL HISTORY:  Ms. HElsberndto review her survivorship care plan detailing her treatment course for breast cancer, as well as monitoring long-term side effects of that treatment, education regarding health maintenance, screening, and overall wellness and health promotion.     Overall, [name] reports feeling moderately well.  She is taking Anastrozole daily.  She notes that she has some difficulty sleeping and believes that this is contributing to her brain fog and fatigue.  She is doing several things to combat this such as increasing her exercise, taking warm baths at night, and stopping electronics earlier in the evening.  She notes she has a frequent watery nose and dry eyes that started during her chemotherapy.  She notes hot flashes and some discomfort  at her port removal site.    REVIEW OF SYSTEMS:  Review of Systems  Constitutional: Positive for fatigue. Negative for appetite change, chills, fever and unexpected weight change.  HENT:   Negative for hearing loss and lump/mass.   Eyes: Positive for eye problems. Negative for icterus.  Respiratory: Negative for chest tightness, cough and  shortness of breath.   Cardiovascular: Negative for leg swelling and palpitations.  Gastrointestinal: Negative for abdominal distention, abdominal pain, constipation, diarrhea, nausea and vomiting.  Endocrine: Positive for hot flashes.  Genitourinary: Negative for difficulty urinating.   Musculoskeletal: Negative for arthralgias.  Skin: Negative for itching and rash.  Neurological: Negative for dizziness, extremity weakness, headaches and numbness.  Hematological: Negative for adenopathy. Does not bruise/bleed easily.  Psychiatric/Behavioral: Negative for depression. The patient is not nervous/anxious.   Breast: Denies any new nodularity, masses, tenderness, nipple changes, or nipple discharge.      ONCOLOGY TREATMENT TEAM:  1. Surgeon:  Dr. Barry Dienes at [facility] 2. Medical Oncologist: Dr. Lindi Adie  3. Radiation Oncologist: Dr. Lisbeth Renshaw    PAST MEDICAL/SURGICAL HISTORY:  Past Medical History:  Diagnosis Date  . Basal cell carcinoma    on back  . Cancer [facility])    breast cancer   Past Surgical History:  Procedure Laterality Date  . BREAST LUMPECTOMY WITH RADIOACTIVE SEED AND SENTINEL LYMPH NODE BIOPSY Left 09/25/2019   Procedure: LEFT BREAST LUMPECTOMY WITH RADIOACTIVE SEED AND SENTINEL LYMPH NODE BIOPSY;  Surgeon: Stark Klein, MD;  Location: [place];  Service: General;  Laterality: Left;  . COLONOSCOPY    . PORT-A-CATH REMOVAL N/A 03/16/2020   Procedure: REMOVAL PORT-A-CATH;  Surgeon: Stark Klein, MD;  Location: [place];  Service: General;  Laterality: N/A;  . PORTACATH PLACEMENT Left 11/19/2019   Procedure: INSERTION PORT-A-CATH;  Surgeon: Stark Klein, MD;  Location: [place];  Service: General;  Laterality: Left;     ALLERGIES:  No Known Allergies   CURRENT MEDICATIONS:  Outpatient Encounter Medications as of 06/16/2020  Medication Sig  . anastrozole (ARIMIDEX) 1 MG tablet Take 1 tablet (1 mg total) by mouth daily.  .  Omega-3 Fatty Acids (FISH OIL) 1000 MG CAPS Take 1,000 mg by mouth 2 (two) times daily.  [name] Glycol-Propyl Glycol (SYSTANE) 0.4-0.3 % SOLN Place 1 drop into both eyes daily.  . Vitamin D-Vitamin K (VITAMIN K2-VITAMIN D3 PO) Take 1 tablet by mouth daily. D3 125 mcg K2 90 mcg  . vitamin E 180 MG (400 UNITS) capsule Take 400 Units by mouth daily.  . [DISCONTINUED] b complex-C-folic acid 1 MG capsule Take 1 capsule by mouth daily.   No facility-administered encounter medications on file as of 06/16/2020.     ONCOLOGIC FAMILY HISTORY:  Family History  Problem Relation Age of Onset  . Lung cancer Father   . Hodgkin's lymphoma Sister   . Breast cancer Paternal Grandmother      GENETIC COUNSELING/TESTING: Not at this time  SOCIAL HISTORY:  Social History   Socioeconomic History  . Marital status: Single    Spouse name: Not on file  . Number of children: Not on file  . Years of education: Not on file  . Highest education level: Not on file  Occupational History  . Not on file  Tobacco Use  . Smoking status: Never Smoker  . Smokeless tobacco: Never Used  Vaping Use  . Vaping Use: Never used  Substance and Sexual Activity  . Alcohol use: Yes  Comment: 4x a year  . Drug use: Never  . Sexual activity: Not on file  Other Topics Concern  . Not on file  Social History Narrative  . Not on file   Social Determinants of Health   Financial Resource Strain:   . Difficulty of Paying Living Expenses:   Food Insecurity:   . Worried About Charity fundraiser in the Last Year:   . Arboriculturist in the Last Year:   Transportation Needs:   . Film/video editor (Medical):   Marland Kitchen Lack of Transportation (Non-Medical):   Physical Activity:   . Days of Exercise per Week:   . Minutes of Exercise per Session:   Stress:   . Feeling of Stress :   Social Connections:   . Frequency of Communication with Friends and Family:   . Frequency of Social Gatherings with Friends and  Family:   . Attends Religious Services:   . Active Member of Clubs or Organizations:   . Attends Archivist Meetings:   Marland Kitchen Marital Status:   Intimate Partner Violence:   . Fear of Current or Ex-Partner:   . Emotionally Abused:   Marland Kitchen Physically Abused:   . Sexually Abused:      OBSERVATIONS/OBJECTIVE:  BP 123/80 (BP Location: Left Arm, Patient Position: Sitting)   Pulse 94   Temp 98.6 F (37 C) (Tympanic)   Resp 17   Ht _0  (1.753 m)   Wt 205 lb 8 oz (93.2 kg)   SpO2 97%   BMI 30.35 kg/m  GENERAL: Patient is a well appearing female in no acute distress HEENT:  Sclerae anicteric.  Oropharynx clear and moist. No ulcerations or evidence of oropharyngeal candidiasis. Neck is supple.  NODES:  No cervical, supraclavicular, or axillary lymphadenopathy palpated.  BREAST EXAM:  Deferred. LUNGS:  Clear to auscultation bilaterally.  No wheezes or rhonchi. HEART:  Regular rate and rhythm. No murmur appreciated. ABDOMEN:  Soft, nontender.  Positive, normoactive bowel sounds. No organomegaly palpated. MSK:  No focal spinal tenderness to palpation. Full range of motion bilaterally in the upper extremities. EXTREMITIES:  No peripheral edema.   SKIN:  Clear with no obvious rashes or skin changes. No nail dyscrasia. NEURO:  Nonfocal. Well oriented.  Appropriate affect.    LABORATORY DATA:  None for this visit.  DIAGNOSTIC IMAGING:  None for this visit.      ASSESSMENT AND PLAN:  Ms.. Wallace is a pleasant 64 y.o. female with Stage IA left breast invasive ductal carcinoma, ER+/PR+/HER2-, diagnosed in 08/2019, treated with lumpectomy, adjuvant chemotherapy, adjuvant radiation therapy, and anti-estrogen therapy with Anastrozole beginning in 04/2020.  She presents to the Survivorship Clinic for our initial meeting and routine follow-up post-completion of treatment for breast cancer.    1. Stage IA left breast cancer:  Ms. Hanover is continuing to recover from definitive treatment  for breast cancer. She will follow-up with her medical oncologist, Dr. Lindi Adie in 09/2020 with history and physical exam per surveillance protocol.  She will continue her anti-estrogen therapy with Anastrozole. Thus far, she is tolerating the Anastrozole well, with minimal side effects. She was instructed to make Dr. Lindi Adie or myself aware if she begins to experience any worsening side effects of the medication and I could see her back in clinic to help manage those side effects, as needed. Her mammogram is due 07/2020.   Today, a comprehensive survivorship care plan and treatment summary was reviewed with the patient today detailing her breast  cancer diagnosis, treatment course, potential late/long-term effects of treatment, appropriate follow-up care with recommendations for the future, and patient education resources.  A copy of this summary, along with a letter will be sent to the patient's primary care provider via mail/fax/In Basket message after today's visit.    2.  Dry eyes: This starting during chemotherapy.  I recommended she follow up with her eye doctor, as it sounds like lacrimal duct stenosis.  She was recommended to use lubricating eye gtts four times a day at minimum.    3. Difficulty Sleeping: Her circadian rhythm is off.  I suggested herbal teas, or melatonin at bedtime initially to help with her falling asleep.  Hopefully this will get her into a better routine, that she won't need the melatonin after that.    4. Fatigue and brain fog: This is likely secondary to the difficulty sleeping.  Hopefully as her sleep improves this will also.  If not, we discussed referral to Dr. Mickeal Skinner to discuss.    5. Bone health:  Given Ms. Cullifer's age/history of breast cancer and her current treatment regimen including anti-estrogen therapy with Anastrozole, she is at risk for bone demineralization.  She was given education on specific activities to promote bone health.  6. Cancer screening:  Due to  Ms. Sikora's history and her age, she should receive screening for skin cancers, colon cancer, and gynecologic cancers.  The information and recommendations are listed on the patient's comprehensive care plan/treatment summary and were reviewed in detail with the patient.    7. Health maintenance and wellness promotion: Ms. Fellows was encouraged to consume 5-7 servings of fruits and vegetables per day. We reviewed the "Nutrition Rainbow" handout, as well as the handout "Take Control of Your Health and Reduce Your Cancer Risk" from the [employer].  She was also encouraged to engage in moderate to vigorous exercise for 30 minutes per day most days of the week. We discussed the [employer] fitness program, which is designed for cancer survivors to help them become more physically fit after cancer treatments.  She was instructed to limit her alcohol consumption and continue to abstain from tobacco use.    8. Support services/counseling: It is not uncommon for this period of the patient's cancer care trajectory to be one of many emotions and stressors.  We discussed how this can be increasingly difficult during the times of quarantine and social distancing due to the COVID-19 pandemic.   She was given information regarding our available services and encouraged to contact me with any questions or for help enrolling in any of our support group/programs.    Follow up instructions:    -Return to cancer center in 09/2020 for f/u with Dr. Lindi Adie  -Mammogram due in 07/2020 -She is welcome to return back to the Survivorship Clinic at any time; no additional follow-up needed at this time.  -Consider referral back to survivorship as a long-term survivor for continued surveillance  She knows to call for any questions that may arise between now and her next appointment.  We are happy to see her sooner if needed.  Total encounter time: 45 minutes*  Wilber Bihari, NP 06/18/20 4:09 PM Medical  Oncology and Hematology [facility] [address], [place] [address] Tel. [phone]    Fax. [phone]  *Total Encounter Time as defined by the Centers for Medicare and Medicaid Services includes, in addition to the face-to-face time of a patient visit (documented in the note above)  non-face-to-face time: obtaining and reviewing outside history, ordering and reviewing medications, tests or procedures, care coordination (communications with other health care professionals or caregivers) and documentation in the medical record.

## 2020-06-21 ENCOUNTER — Encounter (INDEPENDENT_AMBULATORY_CARE_PROVIDER_SITE_OTHER): Payer: Self-pay

## 2020-06-21 ENCOUNTER — Telehealth: Payer: Self-pay | Admitting: Hematology and Oncology

## 2020-06-21 NOTE — Telephone Encounter (Signed)
Scheduled appointment per 8/16 scheduling message. Patient is aware of appointment date and time.

## 2020-07-01 ENCOUNTER — Telehealth: Payer: Self-pay | Admitting: Hematology and Oncology

## 2020-07-01 NOTE — Telephone Encounter (Signed)
Cancelled appointment per 8/26 scheduling message. Patient requested cancellation and declined to reschedule.

## 2020-07-02 ENCOUNTER — Inpatient Hospital Stay: Payer: BC Managed Care – PPO | Admitting: Licensed Clinical Social Worker

## 2020-07-06 ENCOUNTER — Encounter (INDEPENDENT_AMBULATORY_CARE_PROVIDER_SITE_OTHER): Payer: Self-pay

## 2020-07-21 ENCOUNTER — Encounter (INDEPENDENT_AMBULATORY_CARE_PROVIDER_SITE_OTHER): Payer: Self-pay

## 2020-08-25 ENCOUNTER — Encounter (INDEPENDENT_AMBULATORY_CARE_PROVIDER_SITE_OTHER): Payer: Self-pay

## 2020-09-09 NOTE — Progress Notes (Signed)
Patient Care Team: Joella Prince as PCP - General (Physician Assistant) Stark Klein, MD as Consulting Physician (General Surgery) Nicholas Lose, MD as Consulting Physician (Hematology and Oncology) Kyung Rudd, MD as Consulting Physician (Radiation Oncology)  DIAGNOSIS:    ICD-10-CM   1. Malignant neoplasm of upper-outer quadrant of left breast in female, estrogen receptor positive ([place])  C50.412    Z17.0     SUMMARY OF ONCOLOGIC HISTORY: Oncology History  Malignant neoplasm of upper-outer quadrant of left breast in female, estrogen receptor positive ([place])  09/03/2019 Initial Diagnosis   Routine screening mammogram detected a 1.4cm indeterminate left breast mass. US showed a 1.0cm left breast mass and a 0.5cm lymph node with cortical thickening at the 2:00 position in the left breast. 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   09/10/2019 Cancer Staging   Staging form: Breast, AJCC 8th Edition - Clinical stage from 09/10/2019: Stage IA (cT1c, cN0, cM0, G2, ER+, PR+, HER2-)   09/25/2019 Surgery   Left lumpectomy Barry Dienes) [phone]): IDC, grade 2, 1.2cm, with intermediate grade DCIS, 5 axillary lymph nodes negative, clear margins.    09/25/2019 Cancer Staging   Staging form: Breast, AJCC 8th Edition - Pathologic stage from 09/25/2019: Stage IA (pT1c, pN0, cM0, G2, ER+, PR+, HER2-)   11/03/2019 Oncotype testing   The Oncotype DX score was 26 predicting a risk of outside the breast recurrence over the next 9 years of 16% if the patient's only systemic therapy is tamoxifen for 5 years.    11/20/2019 - 01/22/2020 Chemotherapy   dexamethasone (DECADRON) 4 MG tablet, 4 mg (100 % of original dose 4 mg), Oral, Daily, 1 of 1 cycle, Start date: 11/06/2019, End date: 12/11/2019. Dose modification: 4 mg (original dose 4 mg, Cycle 0)  palonosetron (ALOXI) injection 0.25 mg, 0.25 mg, Intravenous,  Once, 4 of 4 cycles.  Administration: 0.25 mg (11/20/2019), 0.25 mg (12/11/2019), 0.25 mg (01/01/2020), 0.25 mg (01/22/2020)  pegfilgrastim-jmdb (FULPHILA) injection 6 mg, 6 mg, Subcutaneous,  Once, 4 of 4 cycles. Administration: 6 mg (11/22/2019), 6 mg (12/13/2019), 6 mg (01/03/2020), 6 mg (01/24/2020)  cyclophosphamide (CYTOXAN) 1,260 mg in sodium chloride 0.9 % 250 mL chemo infusion, 600 mg/m2 = 1,260 mg, Intravenous,  Once, 4 of 4 cycles. Administration: 1,260 mg (11/20/2019), 1,260 mg (12/11/2019), 1,260 mg (01/01/2020), 1,260 mg (01/22/2020)  DOCEtaxel (TAXOTERE) 160 mg in sodium chloride 0.9 % 250 mL chemo infusion, 75 mg/m2 = 160 mg, Intravenous,  Once, 4 of 4 cycles. Administration: 160 mg (11/20/2019), 160 mg (12/11/2019), 160 mg (01/01/2020), 160 mg (01/22/2020).   02/16/2020 - 03/12/2020 Radiation Therapy   The patient initially received a dose of 42.56 Gy in 16 fractions to the breast using whole-breast tangent fields. This was delivered using a 3-D conformal technique. The patient then received a boost to the seroma. This delivered an additional 10 Gy in 64fractions using an en face electron field due to the depth of the seroma. The total dose was 52.56Gy.   04/2020 - 04/2027 Anti-estrogen oral therapy   Anastrozole     CHIEF COMPLIANT: Follow-up of left breast cancer on anastrozole  INTERVAL HISTORY: Hannah Wallace is a 64 y.o. with above-mentioned history of left breast cancerwhounderwent a lumpectomy, adjuvant chemotherapy, radiation, and is currently on antiestrogen therapy with anastrozole. She presents to the clinic today for follow-up.  Hot flashes have gotten better but she continues to have hip and knee pain.  She has not been exercising she has gained a lot of weight.  She tells me that she binges at nighttime on all kinds of snack items.     ALLERGIES:  has No Known Allergies.  MEDICATIONS:  Current Outpatient Medications  Medication Sig Dispense Refill  . anastrozole (ARIMIDEX) 1 MG tablet Take 1 tablet (1 mg  total) by mouth daily. 90 tablet 3  . Omega-3 Fatty Acids (FISH OIL) 1000 MG CAPS Take 1,000 mg by mouth 2 (two) times daily.    [name] Glycol-Propyl Glycol (SYSTANE) 0.4-0.3 % SOLN Place 1 drop into both eyes daily.    . Vitamin D-Vitamin K (VITAMIN K2-VITAMIN D3 PO) Take 1 tablet by mouth daily. D3 125 mcg K2 90 mcg    . vitamin E 180 MG (400 UNITS) capsule Take 400 Units by mouth daily.     No current facility-administered medications for this visit.    PHYSICAL EXAMINATION: ECOG PERFORMANCE STATUS: 1 - Symptomatic but completely ambulatory  Vitals:   09/10/20 1138  BP: (!) 116/59  Pulse: 85  Resp: 18  Temp: 98.3 F (36.8 C)  SpO2: 97%   Filed Weights   09/10/20 1138  Weight: 213 lb 12.8 oz (97 kg)     LABORATORY DATA:  I have reviewed the data as listed CMP Latest Ref Rng & Units 01/22/2020 01/01/2020 12/11/2019  Glucose 70 - 99 mg/dL 91 90 81  BUN 8 - 23 mg/dL $Remove'20 21 23  'LMMoWBy$ Creatinine 0.44 - 1.00 mg/dL 0.68 0.78 0.94  Sodium 135 - 145 mmol/L 139 139 138  Potassium 3.5 - 5.1 mmol/L 3.9 4.3 3.8  Chloride 98 - 111 mmol/L 109 106 103  CO2 22 - 32 mmol/L 21(L) 24 24  Calcium 8.9 - 10.3 mg/dL 8.8(L) 9.5 9.1  Total Protein 6.5 - 8.1 g/dL 6.7 7.2 7.0  Total Bilirubin 0.3 - 1.2 mg/dL 0.3 0.4 1.0  Alkaline Phos 38 - 126 U/L 81 86 59  AST 15 - 41 U/L $Remo'15 16 16  'bppbl$ ALT 0 - 44 U/L $Remo'9 13 18    'WXtSp$ Lab Results  Component Value Date   WBC 12.3 (H) 01/22/2020   HGB 12.4 01/22/2020   HCT 36.8 01/22/2020   MCV 90.4 01/22/2020   PLT 290 01/22/2020   NEUTROABS 9.6 (H) 01/22/2020    ASSESSMENT & PLAN:  Malignant neoplasm of upper-outer quadrant of left breast in female, estrogen receptor positive ([place]) 09/25/2019:Left lumpectomy ([place]): IDC, grade 2, 1.2cm, with intermediate grade DCIS, 5 axillary lymph nodes negative, clear margins.HER-2 - by FISH, ER+ 90%, PR+ 100%, Ki67 2%, Oncotype DX score 26  Treatment plan: 1.Adjuvant chemotherapy with Taxotere and Cytoxan every 3 weeks  x 4 completed 01/22/20 2.Adjuvant radiation therapy 02/17/2020- 03/12/20 3.Adjuvant antiestrogen therapy will be starting April 06, 2020 ------------------------------------------------------------------------------------------------------------------------------------------------------------------- Anastrozole toxicities: Muscle aches and pains and stiffness in her hip and knee  Breast cancer surveillance:  Mammogram September 2021: Benign  Return to clinic in 1 year for follow-up    No orders of the defined types were placed in this encounter.  The patient has a good understanding of the overall plan. she agrees with it. she will call with any problems that may develop before the next visit here.  Total time spent: 20 mins including face to face time and time spent for planning, charting and coordination of care  Nicholas Lose, MD 09/10/2020  I, Cloyde Reams Dorshimer, am acting as scribe for Dr. Nicholas Lose.  I have reviewed the above documentation for accuracy  and completeness, and I agree with the above.       

## 2020-09-10 ENCOUNTER — Inpatient Hospital Stay: Payer: BC Managed Care – PPO | Attending: Hematology and Oncology | Admitting: Hematology and Oncology

## 2020-09-10 ENCOUNTER — Other Ambulatory Visit: Payer: Self-pay

## 2020-09-10 DIAGNOSIS — Z9221 Personal history of antineoplastic chemotherapy: Secondary | ICD-10-CM | POA: Insufficient documentation

## 2020-09-10 DIAGNOSIS — M25569 Pain in unspecified knee: Secondary | ICD-10-CM | POA: Diagnosis not present

## 2020-09-10 DIAGNOSIS — Z923 Personal history of irradiation: Secondary | ICD-10-CM | POA: Diagnosis not present

## 2020-09-10 DIAGNOSIS — Z79811 Long term (current) use of aromatase inhibitors: Secondary | ICD-10-CM | POA: Insufficient documentation

## 2020-09-10 DIAGNOSIS — C50412 Malignant neoplasm of upper-outer quadrant of left female breast: Secondary | ICD-10-CM | POA: Insufficient documentation

## 2020-09-10 DIAGNOSIS — M25559 Pain in unspecified hip: Secondary | ICD-10-CM | POA: Insufficient documentation

## 2020-09-10 DIAGNOSIS — Z170 Estrogen receptor positive status [ER+]: Secondary | ICD-10-CM | POA: Diagnosis not present

## 2020-09-10 MED ORDER — CALCIUM GLUCONATE 500 MG PO TABS: 1.0000 | ORAL_TABLET | Freq: Three times a day (TID) | ORAL | Status: DC

## 2020-09-10 MED ORDER — MAGNESIUM 200 MG PO TABS: 1.0000 | ORAL_TABLET | Freq: Every day | ORAL | Status: DC

## 2020-09-10 NOTE — Assessment & Plan Note (Signed)
09/25/2019:Left lumpectomy [facility]): IDC, grade 2, 1.2cm, with intermediate grade DCIS, 5 axillary lymph nodes negative, clear margins.HER-2 - by FISH, ER+ 90%, PR+ 100%, Ki67 2%, Oncotype DX score 26  Treatment plan: 1.Adjuvant chemotherapy with Taxotere and Cytoxan every 3 weeks x 4 completed 01/22/20 2.Adjuvant radiation therapy 02/17/2020- 03/12/20 3.Adjuvant antiestrogen therapy will be starting April 06, 2020 ------------------------------------------------------------------------------------------------------------------------------------------------------------------- Anastrozole toxicities:  Breast cancer surveillance: 1.  Breast exam: 09/10/2020: Benign 2. Mammogram  Return to clinic in 1 year for follow-up

## 2020-11-18 ENCOUNTER — Telehealth: Payer: Self-pay | Admitting: Medical Oncology

## 2020-11-18 NOTE — Telephone Encounter (Signed)
[id number], A PROSPECTIVE OBSERVATIONAL COHORT STUDY TO DEVELOP A PREDICTIVE MODEL OFTAXANE-INDUCED PERIPHERAL NEUROPATHY IN CANCER PATIENTS.  Outgoing call: Spoke with patient to schedule her for study week 52 visit. Patient provided me with her availability and she was scheduled for a research appointment with myself on January 24th at 0900. Patient thanked for her time and her continued support of study and was encouraged to call with questions. Maxwell Marion, RN, BSN, [facility] Clinical Research 11/18/2020 3:27 PM

## 2020-11-25 ENCOUNTER — Telehealth: Payer: Self-pay | Admitting: Medical Oncology

## 2020-11-25 NOTE — Telephone Encounter (Signed)
[id number]: Incoming call Patient asked to reschedule her appointment with me from Monday to Wednesday 12/01/2020 due to possible inclement weather. Patient informed will reschedule and she is aware of new time. Patient thanked and encouraged to call with questions in the mean time.  Maxwell Marion, RN, BSN, [facility] Clinical Research 11/25/2020 1:01 PM

## 2020-11-29 ENCOUNTER — Encounter: Payer: BC Managed Care – PPO | Admitting: Medical Oncology

## 2020-12-01 ENCOUNTER — Inpatient Hospital Stay: Payer: BLUE CROSS/BLUE SHIELD | Attending: Hematology and Oncology | Admitting: Medical Oncology

## 2020-12-01 ENCOUNTER — Other Ambulatory Visit: Payer: Self-pay

## 2020-12-01 ENCOUNTER — Encounter: Payer: Self-pay | Admitting: Medical Oncology

## 2020-12-01 DIAGNOSIS — Z170 Estrogen receptor positive status [ER+]: Secondary | ICD-10-CM

## 2020-12-01 DIAGNOSIS — C50412 Malignant neoplasm of upper-outer quadrant of left female breast: Secondary | ICD-10-CM

## 2020-12-01 NOTE — Progress Notes (Signed)
S1714, A PROSPECTIVE OBSERVATIONAL COHORT STUDY TO DEVELOP A PREDICTIVE MODEL OF TAXANE-INDUCED PERIPHERAL NEUROPATHY IN CANCER PATIENTS.  52  weeks Visit. Patient presented to the clinic, alone, for today's appointment. I met with patient upon arrival and provided her with study questionnaires for this visit. Patient confirms having no neuropathy symptoms to either hands or feet. Patient and I reviewed her medication list together, and patient confirms to not be taking anything for neuropathy. Patient denies having any new or concerning issues and confirms to be doing well.    PROs: Questionnaires were given to patient to complete in clinic. Collected questionnaires and checked for completeness.  Labs: Patient did not consent to the optional blood collection.  Physician Assessments: CTCAE and Treatment Burden assessed and signed by Dr. Gudena. Patient denies having any issues or concerns today.  Treatment: patient has completed chemotherapy treatments on 01/22/20 and radiation treatment on 03/12/20. History of Falls: patient confirms no falls.    Solicited Neuropathy Events: patient denies having any neuropathy S/S.  Assessment for Interventions for CIPN: Reviewed with patient and CRFs completed. Neuropen Assessment: Completed per protocol by this certified research nurse with recording by Kory Isley. CRC Tuning [place] Assessment: Completed per protocol by this certified research nurse, time and documentation completed by Kory Isley, CRC. Timed Get Up and Go Test: completed and documented at 8.4 seconds. Plan: Informed patient of next study assessments at approximately one years time for 104 weeks assessment. I informed patient that I will notify her a few weeks ahead of date due. Patient denied having any questions at this time. Patient thanked for her time and continued support of study and was encouraged to call me for any questions or concerns she may have prior to her next appointment.  Mirjana C.  Leonetti, RN, BSN, CCRC Clinical Research 12/01/2020 10:08 AM  

## 2021-03-23 ENCOUNTER — Other Ambulatory Visit: Payer: Self-pay | Admitting: Hematology and Oncology

## 2021-04-07 IMAGING — CR DG CHEST 1V PORT
1 series · 1 of 1 positions shown · non-contrast
Comparison: None.

CLINICAL DATA: Port-A-Cath placement

EXAM:
PORTABLE CHEST 1 VIEW

[chest ap]
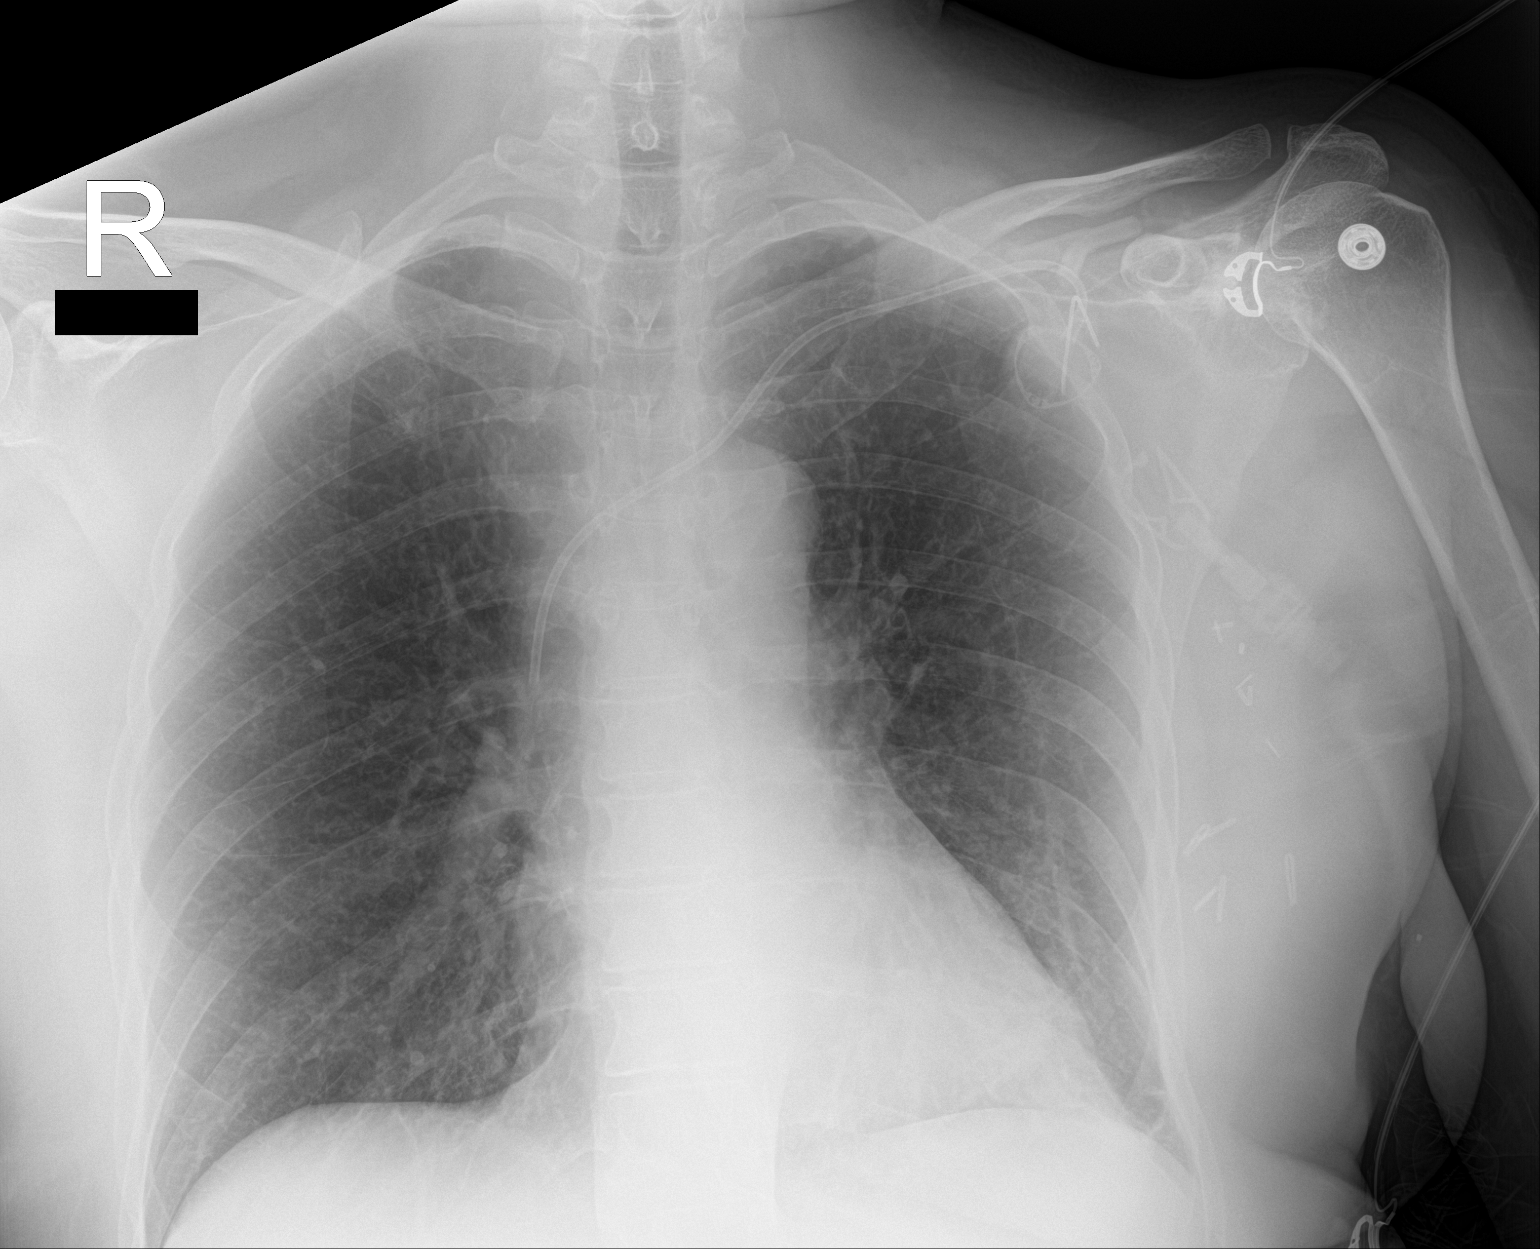

[1 of 1 positions shown; findings below may reference images not displayed]

FINDINGS: Left-sided central venous port with tip projecting over the SVC. No
pneumothorax identified. Linear scarring or atelectasis at the left
base. No pleural effusion. Normal heart size. Postsurgical changes
over the left breast and axilla.
IMPRESSION: 1. Left-sided central venous port tip overlies the SVC. Negative for
pneumothorax.
2. Probable scarring in the left lower lung.

## 2021-05-23 ENCOUNTER — Encounter: Payer: Self-pay | Admitting: Adult Health

## 2021-05-25 ENCOUNTER — Other Ambulatory Visit: Payer: Self-pay | Admitting: Adult Health

## 2021-08-30 ENCOUNTER — Encounter (INDEPENDENT_AMBULATORY_CARE_PROVIDER_SITE_OTHER): Payer: Self-pay

## 2021-09-10 NOTE — Progress Notes (Signed)
Patient Care Team: Joella Prince as PCP - General (Physician Assistant) Stark Klein, MD as Consulting Physician (General Surgery) Nicholas Lose, MD as Consulting Physician (Hematology and Oncology) Kyung Rudd, MD as Consulting Physician (Radiation Oncology)  DIAGNOSIS:    ICD-10-CM   1. Malignant neoplasm of upper-outer quadrant of left breast in female, estrogen receptor positive ([place])  C50.412    Z17.0       SUMMARY OF ONCOLOGIC HISTORY: Oncology History  Malignant neoplasm of upper-outer quadrant of left breast in female, estrogen receptor positive ([place])  09/03/2019 Initial Diagnosis   Routine screening mammogram detected a 1.4cm indeterminate left breast mass. US showed a 1.0cm left breast mass and a 0.5cm lymph node with cortical thickening at the 2:00 position in the left breast. 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   09/10/2019 Cancer Staging   Staging form: Breast, AJCC 8th Edition - Clinical stage from 09/10/2019: Stage IA (cT1c, cN0, cM0, G2, ER+, PR+, HER2-)   09/25/2019 Surgery   Left lumpectomy [facility]) [phone]): IDC, grade 2, 1.2cm, with intermediate grade DCIS, 5 axillary lymph nodes negative, clear margins.    09/25/2019 Cancer Staging   Staging form: Breast, AJCC 8th Edition - Pathologic stage from 09/25/2019: Stage IA (pT1c, pN0, cM0, G2, ER+, PR+, HER2-)   11/03/2019 Oncotype testing   The Oncotype DX score was 26 predicting a risk of outside the breast recurrence over the next 9 years of 16% if the patient's only systemic therapy is tamoxifen for 5 years.    11/20/2019 - 01/22/2020 Chemotherapy   dexamethasone (DECADRON) 4 MG tablet, 4 mg (100 % of original dose 4 mg), Oral, Daily, 1 of 1 cycle, Start date: 11/06/2019, End date: 12/11/2019. Dose modification: 4 mg (original dose 4 mg, Cycle 0)  palonosetron (ALOXI) injection 0.25 mg, 0.25 mg, Intravenous,  Once, 4 of 4  cycles. Administration: 0.25 mg (11/20/2019), 0.25 mg (12/11/2019), 0.25 mg (01/01/2020), 0.25 mg (01/22/2020)  pegfilgrastim-jmdb (FULPHILA) injection 6 mg, 6 mg, Subcutaneous,  Once, 4 of 4 cycles. Administration: 6 mg (11/22/2019), 6 mg (12/13/2019), 6 mg (01/03/2020), 6 mg (01/24/2020)  cyclophosphamide (CYTOXAN) 1,260 mg in sodium chloride 0.9 % 250 mL chemo infusion, 600 mg/m2 = 1,260 mg, Intravenous,  Once, 4 of 4 cycles. Administration: 1,260 mg (11/20/2019), 1,260 mg (12/11/2019), 1,260 mg (01/01/2020), 1,260 mg (01/22/2020)  DOCEtaxel (TAXOTERE) 160 mg in sodium chloride 0.9 % 250 mL chemo infusion, 75 mg/m2 = 160 mg, Intravenous,  Once, 4 of 4 cycles. Administration: 160 mg (11/20/2019), 160 mg (12/11/2019), 160 mg (01/01/2020), 160 mg (01/22/2020).   02/16/2020 - 03/12/2020 Radiation Therapy   The patient initially received a dose of 42.56 Gy in 16 fractions to the breast using whole-breast tangent fields. This was delivered using a 3-D conformal technique. The patient then received a boost to the seroma. This delivered an additional 10 Gy in 67fractions using an en face electron field due to the depth of the seroma. The total dose was 52.56Gy.   04/2020 - 04/2027 Anti-estrogen oral therapy   Anastrozole     CHIEF COMPLIANT: Follow-up of left breast cancer on anastrozole  INTERVAL HISTORY: Hannah Wallace is a 65 y.o. with above-mentioned history of left breast cancer who underwent a lumpectomy, adjuvant chemotherapy, radiation, and is currently on antiestrogen therapy with anastrozole. She presents to the clinic today for follow-up.   ALLERGIES:  has No Known Allergies.  MEDICATIONS:  Current Outpatient Medications  Medication Sig Dispense Refill   anastrozole (ARIMIDEX) 1 MG tablet TAKE 1 TABLET BY MOUTH EVERY DAY 90 tablet 3   Omega-3 Fatty Acids (OMEGA-3 FISH OIL) 1200 MG CAPS Take 2 capsules by mouth daily.     OVER THE COUNTER MEDICATION Take by mouth daily. Calcium ($RemoveBeforeD'1000mg'NpvtbhoisCThtb$ )- Magnesium ($RemoveBefor'500mg'cFAhBeDKTcTy$ )-  Zinc ($Remo'25mg'yexBy$ - with Vitamin D3 (102mcg)     OVER THE COUNTER MEDICATION Take by mouth daily. Vitamin A (956mcg) - D (5000iu) - K (2814mcg)     Polyethyl Glycol-Propyl Glycol (SYSTANE) 0.4-0.3 % SOLN Place 1 drop into both eyes daily.     vitamin E 180 MG (400 UNITS) capsule Take 400 Units by mouth daily.     No current facility-administered medications for this visit.    PHYSICAL EXAMINATION: ECOG PERFORMANCE STATUS: 1 - Symptomatic but completely ambulatory  Vitals:   09/12/21 1050  BP: 134/73  Pulse: 89  Resp: 18  Temp: (!) 97.5 F (36.4 C)  SpO2: 96%   Filed Weights   09/12/21 1050  Weight: 209 lb 12.8 oz (95.2 kg)    BREAST: No palpable masses or nodules in either right or left breasts. No palpable axillary supraclavicular or infraclavicular adenopathy no breast tenderness or nipple discharge. (exam performed in the presence of a chaperone)  LABORATORY DATA:  I have reviewed the data as listed CMP Latest Ref Rng & Units 01/22/2020 01/01/2020 12/11/2019  Glucose 70 - 99 mg/dL 91 90 81  BUN 8 - 23 mg/dL $Remove'20 21 23  'PIgLfwL$ Creatinine 0.44 - 1.00 mg/dL 0.68 0.78 0.94  Sodium 135 - 145 mmol/L 139 139 138  Potassium 3.5 - 5.1 mmol/L 3.9 4.3 3.8  Chloride 98 - 111 mmol/L 109 106 103  CO2 22 - 32 mmol/L 21(L) 24 24  Calcium 8.9 - 10.3 mg/dL 8.8(L) 9.5 9.1  Total Protein 6.5 - 8.1 g/dL 6.7 7.2 7.0  Total Bilirubin 0.3 - 1.2 mg/dL 0.3 0.4 1.0  Alkaline Phos 38 - 126 U/L 81 86 59  AST 15 - 41 U/L $Remo'15 16 16  'frUGz$ ALT 0 - 44 U/L $Remo'9 13 18    'SrqcM$ Lab Results  Component Value Date   WBC 12.3 (H) 01/22/2020   HGB 12.4 01/22/2020   HCT 36.8 01/22/2020   MCV 90.4 01/22/2020   PLT 290 01/22/2020   NEUTROABS 9.6 (H) 01/22/2020    ASSESSMENT & PLAN:  Malignant neoplasm of upper-outer quadrant of left breast in female, estrogen receptor positive ([place]) 09/25/2019:Left lumpectomy ([place]): IDC, grade 2, 1.2cm, with intermediate grade DCIS, 5 axillary lymph nodes negative, clear margins. HER-2 - by FISH, ER+  90%, PR+ 100%, Ki67 2%,  Oncotype DX score 26   Treatment plan:  1. Adjuvant chemotherapy with Taxotere and Cytoxan every 3 weeks x 4 completed 01/22/20 2.  Adjuvant radiation therapy 02/17/2020- 03/12/20 3.  Adjuvant antiestrogen therapy will be starting April 06, 2020 ------------------------------------------------------------------------------------------------------------------------------------------------------------------- Anastrozole toxicities: Muscle aches and pains and stiffness in her hip and knee   Breast cancer surveillance:  M[facility] 07/28/21 at [facility]: Benign Breast exam 09/12/2021: Benign   Return to clinic in 1 year for follow-up    No orders of the defined types were placed in this encounter.  The patient has a good understanding of the overall plan. she agrees with it. she will call with any problems that may develop before the next visit here.  Total time spent: 20 mins including face to face time and time spent for planning, charting and coordination of care  Rulon Eisenmenger, MD, MPH 09/12/2021  I, Thana Ates, am acting as scribe for Dr. Nicholas Lose.  I have reviewed the above documentation for accuracy and completeness, and I agree with the above.

## 2021-09-12 ENCOUNTER — Other Ambulatory Visit: Payer: Self-pay

## 2021-09-12 ENCOUNTER — Inpatient Hospital Stay: Payer: BLUE CROSS/BLUE SHIELD | Attending: Hematology and Oncology | Admitting: Hematology and Oncology

## 2021-09-12 DIAGNOSIS — Z79811 Long term (current) use of aromatase inhibitors: Secondary | ICD-10-CM | POA: Diagnosis not present

## 2021-09-12 DIAGNOSIS — C50412 Malignant neoplasm of upper-outer quadrant of left female breast: Secondary | ICD-10-CM

## 2021-09-12 DIAGNOSIS — M25569 Pain in unspecified knee: Secondary | ICD-10-CM | POA: Diagnosis not present

## 2021-09-12 DIAGNOSIS — Z9221 Personal history of antineoplastic chemotherapy: Secondary | ICD-10-CM | POA: Diagnosis not present

## 2021-09-12 DIAGNOSIS — Z923 Personal history of irradiation: Secondary | ICD-10-CM | POA: Diagnosis not present

## 2021-09-12 DIAGNOSIS — Z170 Estrogen receptor positive status [ER+]: Secondary | ICD-10-CM | POA: Diagnosis not present

## 2021-09-12 DIAGNOSIS — M25559 Pain in unspecified hip: Secondary | ICD-10-CM | POA: Diagnosis not present

## 2021-09-12 MED ORDER — VENLAFAXINE HCL ER 37.5 MG PO CP24
37.5000 mg | ORAL_CAPSULE | Freq: Every day | ORAL | Status: DC
Start: 2021-09-12 — End: 2022-09-11

## 2021-09-12 NOTE — Assessment & Plan Note (Signed)
09/25/2019:Left lumpectomy [facility]): IDC, grade 2, 1.2cm, with intermediate grade DCIS, 5 axillary lymph nodes negative, clear margins.HER-2 - by FISH, ER+ 90%, PR+ 100%, Ki67 2%, Oncotype DX score 26  Treatment plan: 1.Adjuvant chemotherapy with Taxotere and Cytoxan every 3 weeks x 4completed 01/22/20 2.Adjuvant radiation therapy4/13/2021- 03/12/20 3.Adjuvant antiestrogen therapywill be starting April 06, 2020 ------------------------------------------------------------------------------------------------------------------------------------------------------------------- Anastrozole toxicities: Muscle aches and pains and stiffness in her hip and knee  Breast cancer surveillance:  Mammogram September 2021: Benign Breast exam 09/12/2021: Benign  Return to clinic in 1 year for follow-up

## 2021-11-18 ENCOUNTER — Telehealth: Payer: Self-pay | Admitting: Emergency Medicine

## 2021-11-18 NOTE — Telephone Encounter (Signed)
S1714 - A Prospective Observational Cohort Study to Develop a Predictive Model of Taxane-Induced Peripheral Neuropathy in Cancer Patients  11/18/21  2:40pm: Called to complete week 104 assessments via phone.  Patient states she is driving at the moment, but states she will call back once she is able to talk.  Clabe Seal Clinical Research Coordinator I  11/18/21  4:17 PM

## 2021-12-05 ENCOUNTER — Telehealth: Payer: Self-pay | Admitting: Emergency Medicine

## 2021-12-05 NOTE — Telephone Encounter (Signed)
S1714 - A Prospective Observational Cohort Study to Develop a Predictive Model of Taxane-Induced Peripheral Neuropathy in Cancer Patients  12/05/21 - Week 104 Call  4:00pm: Called patient to complete week 104 assessments via the phone.  Confirmed identity using two identifiers.  Patient denies symptoms of neuropathy at this time.  She denies tingling, numbness, burning, or shooting pain.  She does endorse some joint pain which she states is related to arthritis.  Completed required PROs for this time point including PRO-CTCAE, FACT/GOG-NTX-4, and EORTC QLQ-CIPN20 verbally via telephone.    Patient denies any questions or concerns at the time.  She was informed the next assessment will take place over the phone in approximately one year.    Clabe Seal Clinical Research Coordinator I  12/05/21  4:13 PM

## 2022-03-16 ENCOUNTER — Other Ambulatory Visit: Payer: Self-pay | Admitting: Hematology and Oncology

## 2022-05-05 ENCOUNTER — Other Ambulatory Visit: Payer: Self-pay | Admitting: Nurse Practitioner

## 2022-06-20 ENCOUNTER — Other Ambulatory Visit: Payer: Self-pay | Admitting: General Surgery

## 2022-06-20 DIAGNOSIS — Z170 Estrogen receptor positive status [ER+]: Secondary | ICD-10-CM

## 2022-08-01 ENCOUNTER — Encounter: Payer: Self-pay | Admitting: Hematology and Oncology

## 2022-09-04 NOTE — Progress Notes (Signed)
Patient Care Team: Joella Prince as PCP - General (Physician Assistant) Stark Klein, MD as Consulting Physician (General Surgery) Nicholas Lose, MD as Consulting Physician (Hematology and Oncology) Kyung Rudd, MD as Consulting Physician (Radiation Oncology)  DIAGNOSIS:  Encounter Diagnosis  Name Primary?   Malignant neoplasm of upper-outer quadrant of left breast in female, estrogen receptor positive ([place]) Yes    SUMMARY OF ONCOLOGIC HISTORY: Oncology History  Malignant neoplasm of upper-outer quadrant of left breast in female, estrogen receptor positive ([place])  09/03/2019 Initial Diagnosis   Routine screening mammogram detected a 1.4cm indeterminate left breast mass. US showed a 1.0cm left breast mass and a 0.5cm lymph node with cortical thickening at the 2:00 position in the left breast. 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   09/10/2019 Cancer Staging   Staging form: Breast, AJCC 8th Edition - Clinical stage from 09/10/2019: Stage IA (cT1c, cN0, cM0, G2, ER+, PR+, HER2-)   09/25/2019 Surgery   Left lumpectomy [facility]) [phone]): IDC, grade 2, 1.2cm, with intermediate grade DCIS, 5 axillary lymph nodes negative, clear margins.    09/25/2019 Cancer Staging   Staging form: Breast, AJCC 8th Edition - Pathologic stage from 09/25/2019: Stage IA (pT1c, pN0, cM0, G2, ER+, PR+, HER2-)   11/03/2019 Oncotype testing   The Oncotype DX score was 26 predicting a risk of outside the breast recurrence over the next 9 years of 16% if the patient's only systemic therapy is tamoxifen for 5 years.    11/20/2019 - 01/22/2020 Chemotherapy   dexamethasone (DECADRON) 4 MG tablet, 4 mg (100 % of original dose 4 mg), Oral, Daily, 1 of 1 cycle, Start date: 11/06/2019, End date: 12/11/2019. Dose modification: 4 mg (original dose 4 mg, Cycle 0)  palonosetron (ALOXI) injection 0.25 mg, 0.25 mg, Intravenous,  Once, 4 of 4  cycles. Administration: 0.25 mg (11/20/2019), 0.25 mg (12/11/2019), 0.25 mg (01/01/2020), 0.25 mg (01/22/2020)  pegfilgrastim-jmdb (FULPHILA) injection 6 mg, 6 mg, Subcutaneous,  Once, 4 of 4 cycles. Administration: 6 mg (11/22/2019), 6 mg (12/13/2019), 6 mg (01/03/2020), 6 mg (01/24/2020)  cyclophosphamide (CYTOXAN) 1,260 mg in sodium chloride 0.9 % 250 mL chemo infusion, 600 mg/m2 = 1,260 mg, Intravenous,  Once, 4 of 4 cycles. Administration: 1,260 mg (11/20/2019), 1,260 mg (12/11/2019), 1,260 mg (01/01/2020), 1,260 mg (01/22/2020)  DOCEtaxel (TAXOTERE) 160 mg in sodium chloride 0.9 % 250 mL chemo infusion, 75 mg/m2 = 160 mg, Intravenous,  Once, 4 of 4 cycles. Administration: 160 mg (11/20/2019), 160 mg (12/11/2019), 160 mg (01/01/2020), 160 mg (01/22/2020).   02/16/2020 - 03/12/2020 Radiation Therapy   The patient initially received a dose of 42.56 Gy in 16 fractions to the breast using whole-breast tangent fields. This was delivered using a 3-D conformal technique. The patient then received a boost to the seroma. This delivered an additional 10 Gy in 65factions using an en face electron field due to the depth of the seroma. The total dose was 52.56Gy.   04/2020 - 04/2027 Anti-estrogen oral therapy   Anastrozole     CHIEF COMPLIANT: Follow-up of left breast cancer on anastrozole    INTERVAL HISTORY: Hannah Broekeris a 66y.o. with above-mentioned history of left breast cancer who underwent a lumpectomy, adjuvant chemotherapy, radiation, and is currently on antiestrogen therapy with anastrozole. She presents to the clinic today for follow-up.    ALLERGIES:  has No Known Allergies.  MEDICATIONS:  Current Outpatient Medications  Medication Sig Dispense Refill   anastrozole (ARIMIDEX) 1 MG tablet TAKE 1 TABLET BY MOUTH EVERY DAY 90 tablet 3   Omega-3 Fatty Acids (OMEGA-3 FISH OIL) 1200 MG CAPS Take 2 capsules by mouth daily.     OVER THE COUNTER MEDICATION Take by mouth daily. Calcium (1057m)- Magnesium  (5042m- Zinc (2510mwith Vitamin D3 (5mc40m    OVER THE COUNTER MEDICATION Take by mouth daily. Vitamin A (900mc2m D (5000iu) - K (2800mcg52m  Polyethyl Glycol-Propyl Glycol (SYSTANE) 0.4-0.3 % SOLN Place 1 drop into both eyes daily.     venlafaxine XR (EFFEXOR-XR) 37.5 MG 24 hr capsule Take 1 capsule (37.5 mg total) by mouth daily with breakfast.     vitamin E 180 MG (400 UNITS) capsule Take 400 Units by mouth daily.     No current facility-administered medications for this visit.    PHYSICAL EXAMINATION: ECOG PERFORMANCE STATUS: 1 - Symptomatic but completely ambulatory  Vitals:   09/11/22 1057  BP: (!) 157/92  Pulse: 96  Resp: 18  Temp: (!) 97.3 F (36.3 C)  SpO2: 100%   Filed Weights   09/11/22 1057  Weight: 211 lb 12.8 oz (96.1 kg)    BREAST: No palpable masses or nodules in either right or left breasts. No palpable axillary supraclavicular or infraclavicular adenopathy no breast tenderness or nipple discharge. (exam performed in the presence of a chaperone)  LABORATORY DATA:  I have reviewed the data as listed    Latest Ref Rng & Units 01/22/2020    8:01 AM 01/01/2020   11:03 AM 12/11/2019   11:07 AM  CMP  Glucose 70 - 99 mg/dL 91  90  81   BUN 8 - 23 mg/dL _0 Creatinine 0.44 - 1.00 mg/dL 0.68  0.78  0.94   Sodium 135 - 145 mmol/L 139  139  138   Potassium 3.5 - 5.1 mmol/L 3.9  4.3  3.8   Chloride 98 - 111 mmol/L 109  106  103   CO2 22 - 32 mmol/L _1 Calcium 8.9 - 10.3 mg/dL 8.8  9.5  9.1   Total Protein 6.5 - 8.1 g/dL 6.7  7.2  7.0   Total Bilirubin 0.3 - 1.2 mg/dL 0.3  0.4  1.0   Alkaline Phos 38 - 126 U/L 81  86  59   AST 15 - 41 U/L _2 ALT 0 - 44 U/L _3 Lab Results  Component Value Date   WBC 12.3 (H) 01/22/2020   HGB 12.4 01/22/2020   HCT 36.8 01/22/2020   MCV 90.4 01/22/2020   PLT 290 01/22/2020   NEUTROABS 9.6 (H) 01/22/2020    ASSESSMENT & PLAN:  Malignant neoplasm of upper-outer quadrant of left  breast in female, estrogen receptor positive ([facility]) 1Chical9/2020:Left lumpectomy ([place]): IDC, grade 2, 1.2cm, with intermediate grade DCIS, 5 axillary lymph nodes negative, clear margins. HER-2 - by FISH, ER+ 90%, PR+ 100%, Ki67 2%,  Oncotype DX score 26   Treatment plan:  1. Adjuvant chemotherapy with Taxotere and Cytoxan every 3 weeks x 4 completed 01/22/20 2.  Adjuvant radiation therapy 02/17/2020- 03/12/20 3.  Adjuvant antiestrogen therapy will be starting April 06, 2020 ------------------------------------------------------------------------------------------------------------------------------------------------------------------- Anastrozole toxicities: Muscle aches and pains and stiffness in her hip and knee   Breast cancer surveillance:  [facility] Breast exam 09/11/2022:  Benign   Return to clinic in 1 year for follow-up    No orders of the defined types were placed in this encounter.  The patient has a good understanding of the overall plan. she agrees with it. she will call with any problems that may develop before the next visit here. Total time spent: 30 mins including face to face time and time spent for planning, charting and co-ordination of care   Harriette Ohara, MD 09/11/22    I Gardiner Coins am scribing for Dr. Lindi Adie  I have reviewed the above documentation for accuracy and completeness, and I agree with the above.

## 2022-09-11 ENCOUNTER — Inpatient Hospital Stay: Payer: BC Managed Care – PPO | Attending: Hematology and Oncology | Admitting: Hematology and Oncology

## 2022-09-11 ENCOUNTER — Other Ambulatory Visit: Payer: Self-pay

## 2022-09-11 VITALS — BP 157/92 | HR 96 | Temp 97.3°F | Resp 18 | Ht 69.0 in | Wt 211.8 lb

## 2022-09-11 DIAGNOSIS — Z170 Estrogen receptor positive status [ER+]: Secondary | ICD-10-CM | POA: Diagnosis not present

## 2022-09-11 DIAGNOSIS — Z79811 Long term (current) use of aromatase inhibitors: Secondary | ICD-10-CM | POA: Diagnosis not present

## 2022-09-11 DIAGNOSIS — C50412 Malignant neoplasm of upper-outer quadrant of left female breast: Secondary | ICD-10-CM | POA: Insufficient documentation

## 2022-09-11 MED ORDER — EZETIMIBE 10 MG PO TABS: 10.0000 mg | ORAL_TABLET | Freq: Every day | ORAL | Status: AC

## 2022-09-11 NOTE — Assessment & Plan Note (Signed)
09/25/2019:Left lumpectomy [facility]): IDC, grade 2, 1.2cm, with intermediate grade DCIS, 5 axillary lymph nodes negative, clear margins. HER-2 - by FISH, ER+ 90%, PR+ 100%, Ki67 2%,  Oncotype DX score 26   Treatment plan:  1. Adjuvant chemotherapy with Taxotere and Cytoxan every 3 weeks x 4 completed 01/22/20 2.  Adjuvant radiation therapy 02/17/2020- 03/12/20 3.  Adjuvant antiestrogen therapy will be starting April 06, 2020 ------------------------------------------------------------------------------------------------------------------------------------------------------------------- Anastrozole toxicities: Muscle aches and pains and stiffness in her hip and knee   Breast cancer surveillance:  Mammogram 07/31/2022 at [facility]: Benign Breast exam 09/11/2022: Benign   Return to clinic in 1 year for follow-up

## 2022-11-14 ENCOUNTER — Ambulatory Visit: Payer: BC Managed Care – PPO

## 2022-11-27 DIAGNOSIS — C50412 Malignant neoplasm of upper-outer quadrant of left female breast: Secondary | ICD-10-CM

## 2022-11-27 DIAGNOSIS — Z170 Estrogen receptor positive status [ER+]: Secondary | ICD-10-CM

## 2022-11-27 NOTE — Research (Signed)
S1714 - A Prospective Observational Cohort Study to Develop a Predictive Model of Taxane-Induced Peripheral Neuropathy in Cancer Patients  Week 156 call: Called patient to conduct week 156 assessments.  The patient states she that does not have any residual tingling or numbness is hands or feet, and their daily life is unaffected.    PROs: Completed EORTC QLQ-CIPN20, PRO-CTCAE, and FACT/GOG-NTX-4 verbally over the phone.   I have informed the patient that this is her last follow up call for the S1714 study and thanked her for her time.    Johny Drilling, [facility] [employer] [facility] 11/27/2022 1:13 PM

## 2023-01-18 ENCOUNTER — Ambulatory Visit: Payer: Self-pay | Admitting: Dietician

## 2023-01-18 NOTE — Progress Notes (Signed)
Patient participated in the Nutrition and Cancer webinar. AICR guidelines were reviewed.  [facility] nutrition resources for survivors were encouraged (Nutrition 101, cooking classes, individual MNT with outpatient RDs in weight management and DM management).  Contact information provided.  April Manson, RDN, LDN Registered Dietitian, [place] Part Time Remote (Usual office hours: Tuesday-Thursday) Mobile: [phone] Remote Office: [phone]

## 2023-02-11 ENCOUNTER — Other Ambulatory Visit: Payer: Self-pay | Admitting: Hematology and Oncology

## 2023-08-07 ENCOUNTER — Encounter: Payer: Self-pay | Admitting: Hematology and Oncology

## 2023-09-12 ENCOUNTER — Inpatient Hospital Stay: Payer: Medicare Other | Attending: Hematology and Oncology | Admitting: Hematology and Oncology

## 2023-09-12 VITALS — BP 128/66 | HR 105 | Temp 97.5°F | Resp 18 | Ht 69.0 in | Wt 214.0 lb

## 2023-09-12 DIAGNOSIS — C50412 Malignant neoplasm of upper-outer quadrant of left female breast: Secondary | ICD-10-CM | POA: Diagnosis not present

## 2023-09-12 DIAGNOSIS — M8580 Other specified disorders of bone density and structure, unspecified site: Secondary | ICD-10-CM | POA: Diagnosis not present

## 2023-09-12 DIAGNOSIS — Z85828 Personal history of other malignant neoplasm of skin: Secondary | ICD-10-CM | POA: Diagnosis not present

## 2023-09-12 DIAGNOSIS — M25569 Pain in unspecified knee: Secondary | ICD-10-CM | POA: Diagnosis not present

## 2023-09-12 DIAGNOSIS — Z79811 Long term (current) use of aromatase inhibitors: Secondary | ICD-10-CM | POA: Diagnosis not present

## 2023-09-12 DIAGNOSIS — Z170 Estrogen receptor positive status [ER+]: Secondary | ICD-10-CM | POA: Insufficient documentation

## 2023-09-12 NOTE — Assessment & Plan Note (Addendum)
09/25/2019:Left lumpectomy [facility]): IDC, grade 2, 1.2cm, with intermediate grade DCIS, 5 axillary lymph nodes negative, clear margins. HER-2 - by FISH, ER+ 90%, PR+ 100%, Ki67 2%,  Oncotype DX score 26   Treatment plan:  1. Adjuvant chemotherapy with Taxotere and Cytoxan every 3 weeks x 4 completed 01/22/20 2.  Adjuvant radiation therapy 02/17/2020- 03/12/20 3.  Adjuvant antiestrogen therapy started April 06, 2020 ------------------------------------------------------------------------------------------------------------------------------------------------------------------- Anastrozole toxicities: Muscle aches and pains and stiffness in her hip and knee   Breast cancer surveillance:  Mammogram 08/03/2023 at [facility]: Benign Breast exam 09/12/2023: Benign   Return to clinic in 1 year for follow-up

## 2023-09-12 NOTE — Progress Notes (Signed)
Patient Care Team: Barrington Ellison as PCP - General (Physician Assistant) Almond Lint, MD as Consulting Physician (General Surgery) Serena Croissant, MD as Consulting Physician (Hematology and Oncology) Dorothy Puffer, MD as Consulting Physician (Radiation Oncology)  DIAGNOSIS:  Encounter Diagnosis  Name Primary?   Malignant neoplasm of upper-outer quadrant of left breast in female, estrogen receptor positive (HCC) Yes    SUMMARY OF ONCOLOGIC HISTORY: Oncology History  Malignant neoplasm of upper-outer quadrant of left breast in female, estrogen receptor positive (HCC)  09/03/2019 Initial Diagnosis   Routine screening mammogram detected a 1.4cm indeterminate left breast mass. US showed a 1.0cm left breast mass and a 0.5cm lymph node with cortical thickening at the 2:00 position in the left breast. 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   09/10/2019 Cancer Staging   Staging form: Breast, AJCC 8th Edition - Clinical stage from 09/10/2019: Stage IA (cT1c, cN0, cM0, G2, ER+, PR+, HER2-)   09/25/2019 Surgery   Left lumpectomy Donell Beers) [phone]): IDC, grade 2, 1.2cm, with intermediate grade DCIS, 5 axillary lymph nodes negative, clear margins.    09/25/2019 Cancer Staging   Staging form: Breast, AJCC 8th Edition - Pathologic stage from 09/25/2019: Stage IA (pT1c, pN0, cM0, G2, ER+, PR+, HER2-)   11/03/2019 Oncotype testing   The Oncotype DX score was 26 predicting a risk of outside the breast recurrence over the next 9 years of 16% if the patient's only systemic therapy is tamoxifen for 5 years.    11/20/2019 - 01/22/2020 Chemotherapy   dexamethasone (DECADRON) 4 MG tablet, 4 mg (100 % of original dose 4 mg), Oral, Daily, 1 of 1 cycle, Start date: 11/06/2019, End date: 12/11/2019. Dose modification: 4 mg (original dose 4 mg, Cycle 0)  palonosetron (ALOXI) injection 0.25 mg, 0.25 mg, Intravenous,  Once, 4 of 4  cycles. Administration: 0.25 mg (11/20/2019), 0.25 mg (12/11/2019), 0.25 mg (01/01/2020), 0.25 mg (01/22/2020)  pegfilgrastim-jmdb (FULPHILA) injection 6 mg, 6 mg, Subcutaneous,  Once, 4 of 4 cycles. Administration: 6 mg (11/22/2019), 6 mg (12/13/2019), 6 mg (01/03/2020), 6 mg (01/24/2020)  cyclophosphamide (CYTOXAN) 1,260 mg in sodium chloride 0.9 % 250 mL chemo infusion, 600 mg/m2 = 1,260 mg, Intravenous,  Once, 4 of 4 cycles. Administration: 1,260 mg (11/20/2019), 1,260 mg (12/11/2019), 1,260 mg (01/01/2020), 1,260 mg (01/22/2020)  DOCEtaxel (TAXOTERE) 160 mg in sodium chloride 0.9 % 250 mL chemo infusion, 75 mg/m2 = 160 mg, Intravenous,  Once, 4 of 4 cycles. Administration: 160 mg (11/20/2019), 160 mg (12/11/2019), 160 mg (01/01/2020), 160 mg (01/22/2020).   02/16/2020 - 03/12/2020 Radiation Therapy   The patient initially received a dose of 42.56 Gy in 16 fractions to the breast using whole-breast tangent fields. This was delivered using a 3-D conformal technique. The patient then received a boost to the seroma. This delivered an additional 10 Gy in 65fractions using an en face electron field due to the depth of the seroma. The total dose was 52.56Gy.   04/2020 - 04/2027 Anti-estrogen oral therapy   Anastrozole     CHIEF COMPLIANT: Follow-up on anastrozole therapy  HISTORY OF PRESENT ILLNESS:   History of Present Illness   The patient, on anastrozole for over three years, reports general good health with the exception of a basal cell spot removed last year. She expresses concern about changes in her bone density, noting that recent DEXA scan results show a slight improvement from -1.4 to -1.0, moving  from osteopenia to normal bone density.  The patient also reports issues with her knee, which she believes may be related to the anastrozole. She describes an incident where she pulled her knee and subsequent diagnosis of arthritis in the knee by an orthopedic specialist. She also notes tightness in her hips,  particularly when trying to sit down, such as when jet skiing. The patient is interested in physical therapy to improve mobility and prevent further injuries.  The patient also inquires about the potential benefits of having her ovaries removed, but understands that this would not change her current medication regimen. She expresses difficulty in losing weight, despite walking 10,000 steps a day, and attributes this to menopause and the anastrozole.         ALLERGIES:  has No Known Allergies.  MEDICATIONS:  Current Outpatient Medications  Medication Sig Dispense Refill   anastrozole (ARIMIDEX) 1 MG tablet TAKE 1 TABLET BY MOUTH EVERY DAY 90 tablet 3   ezetimibe (ZETIA) 10 MG tablet Take 1 tablet (10 mg total) by mouth daily.     Omega-3 Fatty Acids (OMEGA-3 FISH OIL) 1200 MG CAPS Take 2 capsules by mouth daily.     OVER THE COUNTER MEDICATION Take by mouth daily. Calcium (1000mg )- Magnesium (500mg )- Zinc (25mg - with Vitamin D3 ( )     OVER THE COUNTER MEDICATION Take by mouth daily. Vitamin A ( ) - D (5000iu) - K ( )     Polyethyl Glycol-Propyl Glycol (SYSTANE) 0.4-0.3 % SOLN Place 1 drop into both eyes daily.     vitamin E 180 MG (400 UNITS) capsule Take 400 Units by mouth daily.     No current facility-administered medications for this visit.    PHYSICAL EXAMINATION: ECOG PERFORMANCE STATUS: 1 - Symptomatic but completely ambulatory  Vitals:   09/12/23 0934  BP: 128/66  Pulse: (!) 105  Resp: 18  Temp: (!) 97.5 F (36.4 C)  SpO2: 95%   Filed Weights   09/12/23 0934  Weight: 214 lb (97.1 kg)      LABORATORY DATA:  I have reviewed the data as listed    Latest Ref Rng & Units 01/22/2020    8:01 AM 01/01/2020   11:03 AM 12/11/2019   11:07 AM  CMP  Glucose 70 - 99 mg/dL 91  90  81   BUN 8 - 23 mg/dL 20  21  23    Creatinine 0.44 - 1.00 mg/dL 7.82  9.56  2.13   Sodium 135 - 145 mmol/L 139  139  138   Potassium 3.5 - 5.1 mmol/L 3.9  4.3  3.8   Chloride 98 -  111 mmol/L 109  106  103   CO2 22 - 32 mmol/L 21  24  24    Calcium 8.9 - 10.3 mg/dL 8.8  9.5  9.1   Total Protein 6.5 - 8.1 g/dL 6.7  7.2  7.0   Total Bilirubin 0.3 - 1.2 mg/dL 0.3  0.4  1.0   Alkaline Phos 38 - 126 U/L 81  86  59   AST 15 - 41 U/L 15  16  16    ALT 0 - 44 U/L 9  13  18      Lab Results  Component Value Date   WBC 12.3 (H) 01/22/2020   HGB 12.4 01/22/2020   HCT 36.8 01/22/2020   MCV 90.4 01/22/2020   PLT 290 01/22/2020   NEUTROABS 9.6 (H) 01/22/2020    ASSESSMENT & PLAN:  Malignant neoplasm of upper-outer quadrant of left breast  in female, estrogen receptor positive (HCC) 09/25/2019:Left lumpectomy ([place]): IDC, grade 2, 1.2cm, with intermediate grade DCIS, 5 axillary lymph nodes negative, clear margins. HER-2 - by FISH, ER+ 90%, PR+ 100%, Ki67 2%,  Oncotype DX score 26   Treatment plan:  1. Adjuvant chemotherapy with Taxotere and Cytoxan every 3 weeks x 4 completed 01/22/20 2.  Adjuvant radiation therapy 02/17/2020- 03/12/20 3.  Adjuvant antiestrogen therapy started April 06, 2020 ------------------------------------------------------------------------------------------------------------------------------------------------------------------- Anastrozole toxicities: Muscle aches and pains and stiffness in her hip and knee   Breast cancer surveillance:  Mamm[facility]/03/20[facility]: Benign Breast exam 09/12/2023: Benign   Return to clinic in 1 year for follow-up ------------------------------------- Assessment and Plan    Osteopenia Improvement in DEXA scan results from -1.4 to -1.0, now within normal range. No current symptoms of osteoporosis. -Continue current management.  Joint Pain and Mobility Issues Complaints of knee pain and hip stiffness. Possible contribution from Anastrozole. -Refer to physical therapy for mobility and alignment assessment and treatment. -Consider a two-week trial off Anastrozole to assess for improvement in joint symptoms. Patient to  communicate via MyChart if symptoms improve and medication switch is desired.  Weight Management Difficulty losing weight, possibly exacerbated by menopause and Anastrozole. -Encourage continued efforts in diet and exercise.  Basal Cell Carcinoma History of basal cell carcinoma with a spot removed last year. No new lesions reported. -Continue regular skin checks and report any new or changing lesions.          Orders Placed This Encounter  Procedures   Ambulatory referral to Physical Therapy    Referral Priority:   Routine    Referral Type:   Physical Medicine    Referral Reason:   Specialty Services Required    Requested Specialty:   Physical Therapy    Number of Visits Requested:   1   The patient has a good understanding of the overall plan. she agrees with it. she will call with any problems that may develop before the next visit here. Total time spent: 30 mins including face to face time and time spent for planning, charting and co-ordination of care   Tamsen Meek, MD 09/12/23

## 2023-09-13 ENCOUNTER — Encounter: Payer: Self-pay | Admitting: Rehabilitation

## 2023-09-13 ENCOUNTER — Other Ambulatory Visit: Payer: Self-pay

## 2023-09-13 ENCOUNTER — Ambulatory Visit: Payer: Medicare Other | Attending: Hematology and Oncology | Admitting: Rehabilitation

## 2023-09-13 DIAGNOSIS — M25652 Stiffness of left hip, not elsewhere classified: Secondary | ICD-10-CM

## 2023-09-13 DIAGNOSIS — Z170 Estrogen receptor positive status [ER+]: Secondary | ICD-10-CM | POA: Diagnosis not present

## 2023-09-13 DIAGNOSIS — M25561 Pain in right knee: Secondary | ICD-10-CM | POA: Diagnosis not present

## 2023-09-13 DIAGNOSIS — R262 Difficulty in walking, not elsewhere classified: Secondary | ICD-10-CM | POA: Diagnosis not present

## 2023-09-13 DIAGNOSIS — M25651 Stiffness of right hip, not elsewhere classified: Secondary | ICD-10-CM | POA: Diagnosis not present

## 2023-09-13 DIAGNOSIS — C50412 Malignant neoplasm of upper-outer quadrant of left female breast: Secondary | ICD-10-CM | POA: Diagnosis present

## 2023-09-13 NOTE — Therapy (Signed)
OUTPATIENT PHYSICAL THERAPY  LOWER EXTREMITY ONCOLOGY EVALUATION  Patient Name: Hannah Wallace MRN: [id number] DOB:16-Sep-1956, 67 y.o., female Today's Date: 09/13/2023  END OF SESSION:  PT End of Session - 09/13/23 1551     Visit Number 1    Number of Visits 9    Date for PT Re-Evaluation 10/18/23    PT Start Time 1100    PT Stop Time 1150    PT Time Calculation (min) 50 min    Activity Tolerance Patient tolerated treatment well    Behavior During Therapy WFL for tasks assessed/performed             Past Medical History:  Diagnosis Date   Basal cell carcinoma    on back   Cancer [facility])    breast cancer   Past Surgical History:  Procedure Laterality Date   BREAST LUMPECTOMY WITH RADIOACTIVE SEED AND SENTINEL LYMPH NODE BIOPSY Left 09/25/2019   Procedure: LEFT BREAST LUMPECTOMY WITH RADIOACTIVE SEED AND SENTINEL LYMPH NODE BIOPSY;  Surgeon: Almond Lint, MD;  Location: [facility] OR;  Service: General;  Laterality: Left;   COLONOSCOPY     PORT-A-CATH REMOVAL N/A 03/16/2020   Procedure: REMOVAL PORT-A-CATH;  Surgeon: Almond Lint, MD;  Location: [facility];  Service: General;  Laterality: N/A;   PORTACATH PLACEMENT Left 11/19/2019   Procedure: INSERTION PORT-A-CATH;  Surgeon: Almond Lint, MD;  Location: [facility];  Service: General;  Laterality: Left;   Patient Active Problem List   Diagnosis Date Noted   Port-A-Cath in place 11/20/2019   Malignant neoplasm of upper-outer quadrant of left breast in female, estrogen receptor positive (HCC) 09/09/2019    PCP: Irving Copas, PA-C  REFERRING PROVIDER: Serena Croissant, MD  REFERRING DIAG:  Diagnosis  C50.412,Z17.0 (ICD-10-CM) - Malignant neoplasm of upper-outer quadrant of left breast in female, estrogen receptor positive (HCC)   THERAPY DIAG:  Acute pain of right knee  Stiffness of left hip, not elsewhere classified  Stiffness of right hip, not elsewhere classified  Difficulty in walking,  not elsewhere classified  ONSET DATE: 11/06/22  Rationale for Evaluation and Treatment: Rehabilitation  SUBJECTIVE:                                                                                                                                                                                           SUBJECTIVE STATEMENT: I have noticed stiffness in my hips and less flexibility.  I've noticed that housework and walking a lot is getting hard because of feeling stiff.  In September I stepped wrong and hurt that Rt knee.  They checked it and said it was okay.  PERTINENT HISTORY: Lt breast cancer hx on anastrazole. 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  Past xrays of Rt hip showing OA.    PAIN:  Are you having pain? Yes - not at rest  NPRS scale: 5/10 Pain location: can be either hip or the Rt knee  Pain orientation: Bilateral  PAIN TYPE: aching and throbbing Pain description: intermittent  Aggravating factors: laying on either hip  Relieving factors: moving around   PRECAUTIONS: None  RED FLAGS: None   WEIGHT BEARING RESTRICTIONS: No  FALLS:  Has patient fallen in last 6 months? No  LIVING ENVIRONMENT: Lives with: lives with their family and lives alone Lives in: House/apartment Stairs: Yes; 2 steps into the house.  I have handles to be extra careful.   Has following equipment at home: None  OCCUPATION: Just retired this year.    LEISURE: walking, I would like to go to the gym with the medicare benefits now.    PRIOR LEVEL OF FUNCTION: Independent  PATIENT GOALS: I just want to be more flexible - I need to ride my jetski in the summer.    OBJECTIVE: Note: Objective measures were completed at Evaluation unless otherwise noted.  COGNITION: Overall cognitive status: Within functional limits for tasks assessed   PALPATION: No ttp to the knee joint structures  OBSERVATIONS / OTHER ASSESSMENTS: Rt knee slightly swollen compared to the left and lower leg swollen with +1 pitting  at the shin.   POSTURE: SL stance WNL and able to hold 10sec.   Squat: WNL Heel raise and Toe raise WNL   LOWER EXTREMITY STRENGTH:  MMT Right eval  Hip flexion 5  Hip extension 4+  Hip abduction 4+  Hip adduction   Hip internal rotation 5  Hip external rotation 5  Knee flexion 5  Knee extension 5  Ankle dorsiflexion   Ankle plantarflexion   Ankle inversion   Ankle eversion   Great toe extension    (Blank rows = not tested)  MMT LEFT eval  Hip flexion 5  Hip extension 4+  Hip abduction 4+  Hip adduction   Hip internal rotation 5  Hip external rotation 5  Knee flexion 5  Knee extension 5  Ankle dorsiflexion   Ankle plantarflexion   Ankle inversion   Ankle eversion   Great toe extension     (Blank rows = not tested)  FUNCTIONAL TESTS:   GAIT: gait in clinic hallway: noted more foot flat strike on the Rt leg with slightly antalgic gait.  Able to correct with vcs for heel to toe  Outcome measure: LEFS:  55/80  TODAY'S TREATMENT:                                                                                                                                          DATE: 09/13/23 TE: Initial HEP education per below - HEP code lost  before copying MT: Education on MLD for the Rt LE as pt is aware of how to do MLD for the Lt chest and arm area.  Discussed elevation, compression stockings and measured pt for ames walker knee high with handout on ordering and on [employer] in [place].   Discussed pool which pt already does.    PATIENT EDUCATION:  Education details: per today's note Person educated: Patient Education method: Programmer, multimedia, Demonstration, Tactile cues, Verbal cues, and Handouts Education comprehension: verbalized understanding and returned demonstration  HOME EXERCISE PROGRAM: Supine adductor stretch supported for relaxation KTOS stretch Focus on heel to toe gait Get compression stockings, try elevation  ASSESSMENT:  CLINICAL IMPRESSION: Patient is a  67 y.o. female who was seen today for physical therapy evaluation and treatment for her acute Rt knee pain and more chronic bil hip tightness.  She is on anastrazole which she contributes a lot of the tightness to.  She also stepped wrong a few weeks ago and hurt the Rt knee. No tenderness to palpation or pain with MMT but she does have significant swelling with pitting in the Rt knee and lower leg. She is overall very strong but tight in bilateral hips especially adductors, KTOS, and hip IR motions.  She is also very active but her walking is limited due to her knee pain.     OBJECTIVE IMPAIRMENTS: Abnormal gait, decreased activity tolerance, decreased knowledge of use of DME, difficulty walking, and impaired flexibility.   ACTIVITY LIMITATIONS: squatting and locomotion level  PARTICIPATION LIMITATIONS: cleaning, community activity, and yard work  PERSONAL FACTORS:  none  are also affecting patient's functional outcome.   REHAB POTENTIAL: Excellent  CLINICAL DECISION MAKING: Stable/uncomplicated  EVALUATION COMPLEXITY: Moderate    GOALS: Goals reviewed with patient? Yes    LONG TERM GOALS: Target date: 10/21/23  Pt will return to walking heel - toe pattern for a more normal gait pattern Baseline:  Goal status: INITIAL  2.  Pt will decrease Rt knee and lower leg edema to non-pitting to improve mobility  Baseline:  Goal status: INITIAL  3.  Pt will return to walking without limitations  Baseline:  Goal status: INITIAL  4.  Pt will be ind with hip stretches for continued mobility  Baseline:  Goal status: INITIAL   PLAN:  PT FREQUENCY: 1-2x/week  PT DURATION: 4 weeks (5 added to POC for timing of visits)  PLANNED INTERVENTIONS: [id number]- PT Re-evaluation, 97110-Therapeutic exercises, 97530- Therapeutic activity, 97112- Neuromuscular re-education, 97535- Self Care, [id number]- Manual therapy, Patient/Family education, Balance training, Taping, Joint mobilization, Manual lymph  drainage, DME instructions, Therapeutic exercises, Therapeutic activity, Neuromuscular re-education, Gait training, and Self Care  PLAN FOR NEXT SESSION: decrease Rt LE edema - MLD, taping, etc.  Work on heel toe pattern, nustep or bike, bil hip stretches and MT    Rahmon Heigl R, PT 09/13/2023, 3:53 PM

## 2023-09-18 ENCOUNTER — Ambulatory Visit: Payer: Medicare Other

## 2023-09-18 DIAGNOSIS — M25652 Stiffness of left hip, not elsewhere classified: Secondary | ICD-10-CM

## 2023-09-18 DIAGNOSIS — C50412 Malignant neoplasm of upper-outer quadrant of left female breast: Secondary | ICD-10-CM | POA: Diagnosis not present

## 2023-09-18 DIAGNOSIS — M25561 Pain in right knee: Secondary | ICD-10-CM

## 2023-09-18 DIAGNOSIS — M25651 Stiffness of right hip, not elsewhere classified: Secondary | ICD-10-CM

## 2023-09-18 DIAGNOSIS — R262 Difficulty in walking, not elsewhere classified: Secondary | ICD-10-CM

## 2023-09-18 NOTE — Therapy (Signed)
OUTPATIENT PHYSICAL THERAPY  TREATMENT  Patient Name: Hannah Wallace MRN: [id number] DOB:28-Jun-1956, 67 y.o., female Today's Date: 09/18/2023  END OF SESSION:  PT End of Session - 09/18/23 1148     Visit Number 2    Date for PT Re-Evaluation 10/18/23    Authorization Type Medicare B/Medicaid    PT Start Time 1103    PT Stop Time 1145    PT Time Calculation (min) 42 min    Activity Tolerance Patient tolerated treatment well    Behavior During Therapy WFL for tasks assessed/performed              Past Medical History:  Diagnosis Date   Basal cell carcinoma    on back   Cancer [facility])    breast cancer   Past Surgical History:  Procedure Laterality Date   BREAST LUMPECTOMY WITH RADIOACTIVE SEED AND SENTINEL LYMPH NODE BIOPSY Left 09/25/2019   Procedure: LEFT BREAST LUMPECTOMY WITH RADIOACTIVE SEED AND SENTINEL LYMPH NODE BIOPSY;  Surgeon: Almond Lint, MD;  Location: [facility] OR;  Service: General;  Laterality: Left;   COLONOSCOPY     PORT-A-CATH REMOVAL N/A 03/16/2020   Procedure: REMOVAL PORT-A-CATH;  Surgeon: Almond Lint, MD;  Location: [facility];  Service: General;  Laterality: N/A;   PORTACATH PLACEMENT Left 11/19/2019   Procedure: INSERTION PORT-A-CATH;  Surgeon: Almond Lint, MD;  Location: [facility];  Service: General;  Laterality: Left;   Patient Active Problem List   Diagnosis Date Noted   Port-A-Cath in place 11/20/2019   Malignant neoplasm of upper-outer quadrant of left breast in female, estrogen receptor positive (HCC) 09/09/2019    PCP: Irving Copas, PA-C  REFERRING PROVIDER: Serena Croissant, MD  REFERRING DIAG:  Diagnosis  C50.412,Z17.0 (ICD-10-CM) - Malignant neoplasm of upper-outer quadrant of left breast in female, estrogen receptor positive (HCC)   THERAPY DIAG:  Acute pain of right knee  Stiffness of left hip, not elsewhere classified  Stiffness of right hip, not elsewhere classified  Difficulty in walking, not  elsewhere classified  ONSET DATE: 11/06/22  Rationale for Evaluation and Treatment: Rehabilitation  SUBJECTIVE:                                                                                                                                                                                           SUBJECTIVE STATEMENT: I haven't gotten a compression garment yet.  Using a knee sleeve sometimes.    PERTINENT HISTORY: Lt breast cancer hx on anastrazole. 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  Past xrays of Rt hip showing OA.    PAIN: 09/18/23 Are you having pain? Yes -  not at rest  NPRS scale: 0/10 Pain location: can be either hip or the Rt knee  Pain orientation: Bilateral  PAIN TYPE: aching and throbbing Pain description: intermittent  Aggravating factors: laying on either hip  Relieving factors: moving around   PRECAUTIONS: None  RED FLAGS: None   WEIGHT BEARING RESTRICTIONS: No  FALLS:  Has patient fallen in last 6 months? No  LIVING ENVIRONMENT: Lives with: lives with their family and lives alone Lives in: House/apartment Stairs: Yes; 2 steps into the house.  I have handles to be extra careful.   Has following equipment at home: None  OCCUPATION: Just retired this year.    LEISURE: walking, I would like to go to the gym with the medicare benefits now.    PRIOR LEVEL OF FUNCTION: Independent  PATIENT GOALS: I just want to be more flexible - I need to ride my jetski in the summer.    OBJECTIVE: Note: Objective measures were completed at Evaluation unless otherwise noted.  COGNITION: Overall cognitive status: Within functional limits for tasks assessed   PALPATION: No ttp to the knee joint structures  OBSERVATIONS / OTHER ASSESSMENTS: Rt knee slightly swollen compared to the left and lower leg swollen with +1 pitting at the shin.   POSTURE: SL stance WNL and able to hold 10sec.   Squat: WNL Heel raise and Toe raise WNL   LOWER EXTREMITY STRENGTH:  MMT Right eval   Hip flexion 5  Hip extension 4+  Hip abduction 4+  Hip adduction   Hip internal rotation 5  Hip external rotation 5  Knee flexion 5  Knee extension 5  Ankle dorsiflexion   Ankle plantarflexion   Ankle inversion   Ankle eversion   Great toe extension    (Blank rows = not tested)  MMT LEFT eval  Hip flexion 5  Hip extension 4+  Hip abduction 4+  Hip adduction   Hip internal rotation 5  Hip external rotation 5  Knee flexion 5  Knee extension 5  Ankle dorsiflexion   Ankle plantarflexion   Ankle inversion   Ankle eversion   Great toe extension     (Blank rows = not tested)  FUNCTIONAL TESTS:   GAIT: gait in clinic hallway: noted more foot flat strike on the Rt leg with slightly antalgic gait.  Able to correct with vcs for heel to toe  Outcome measure: LEFS:  55/80  TODAY'S TREATMENT:                                                                                                                                         DATE: 09/18/23 NuStep: Level 4x 6 minutes- PT present to discuss progress Seated hamstring and figure 4 stretch 3x20 seconds  Supine butterfly stretch 2x20 seconds  Sit to stand 2x10 Gastroc stretch at wall 2x20 seconds  Discussed walking program  Farmer's carry with heel to  toe gait around clinic- verbal and visual feedback for symmetry and heel strike DATE: 09/13/23 TE: Initial HEP education per below - HEP code lost before copying MT: Education on MLD for the Rt LE as pt is aware of how to do MLD for the Lt chest and arm area.  Discussed elevation, compression stockings and measured pt for ames walker knee high with handout on ordering and on ETI in [place].   Discussed pool which pt already does.    PATIENT EDUCATION:  Education details: Access Code: [email or web address] Person educated: Patient Education method: Explanation, Demonstration, Tactile cues, Verbal cues, and Handouts Education comprehension: verbalized understanding and returned  demonstration  HOME EXERCISE PROGRAM: Access Code: [email or web address] URL: [email or web address] Date: 09/18/2023 Prepared by: Tresa Endo  Exercises - Supine Butterfly Groin Stretch  - 2-3 x daily - 7 x weekly - 1 sets - 3 reps - 30 hold - Supine Piriformis Stretch Pulling Heel to Hip  - 2-3 x daily - 7 x weekly - 1 sets - 3 reps - 30 hold - Seated Hamstring Stretch  - 2-3 x daily - 7 x weekly - 1 sets - 3 reps - 30 hold - Gastroc Stretch on Wall  - 2-3 x daily - 7 x weekly - 1 sets - 3 reps - 20-30 hold - Supine Active Straight Leg Raise  - 1 x daily - 7 x weekly - 2 sets - 10 reps  ASSESSMENT:  CLINICAL IMPRESSION: First time follow-up after evaluation.  Pt is doing well with HEP for flexibility and demonstrated all aspects correctly. PT added to HEP for gastroc flexibility and glute/quad strength to support Rt knee.  PT provided verbal cues for heel to toe gait with farmer's carry. Pt with antalgia with reduced time on Rt LE and reduced heel strike due to lack of full extension on the Rt.  Patient will benefit from skilled PT to address the below impairments and improve overall function.   OBJECTIVE IMPAIRMENTS: Abnormal gait, decreased activity tolerance, decreased knowledge of use of DME, difficulty walking, and impaired flexibility.   ACTIVITY LIMITATIONS: squatting and locomotion level  PARTICIPATION LIMITATIONS: cleaning, community activity, and yard work  PERSONAL FACTORS:  none  are also affecting patient's functional outcome.   REHAB POTENTIAL: Excellent  CLINICAL DECISION MAKING: Stable/uncomplicated  EVALUATION COMPLEXITY: Moderate    GOALS: Goals reviewed with patient? Yes    LONG TERM GOALS: Target date: 10/21/23  Pt will return to walking heel - toe pattern for a more normal gait pattern Baseline:  Goal status: INITIAL  2.  Pt will decrease Rt knee and lower leg edema to non-pitting to improve mobility  Baseline:  Goal status: INITIAL  3.  Pt will  return to walking without limitations  Baseline:  Goal status: INITIAL  4.  Pt will be ind with hip stretches for continued mobility  Baseline:  Goal status: INITIAL   PLAN:  PT FREQUENCY: 1-2x/week  PT DURATION: 4 weeks (5 added to POC for timing of visits)  PLANNED INTERVENTIONS: 97164- PT Re-evaluation, 97110-Therapeutic exercises, 97530- Therapeutic activity, 97112- Neuromuscular re-education, 97535- Self Care, 14782- Manual therapy, Patient/Family education, Balance training, Taping, Joint mobilization, Manual lymph drainage, DME instructions, Therapeutic exercises, Therapeutic activity, Neuromuscular re-education, Gait training, and Self Care  PLAN FOR NEXT SESSION: work on hamstring and gastroc stretch, heel to toe gait, Rt knee and hip strength and stability   Lorrene Reid, PT 09/18/23 11:50 AM

## 2023-09-20 ENCOUNTER — Encounter: Payer: Self-pay | Admitting: Physical Therapy

## 2023-09-20 ENCOUNTER — Ambulatory Visit: Payer: Medicare Other | Admitting: Physical Therapy

## 2023-09-20 DIAGNOSIS — R262 Difficulty in walking, not elsewhere classified: Secondary | ICD-10-CM

## 2023-09-20 DIAGNOSIS — C50412 Malignant neoplasm of upper-outer quadrant of left female breast: Secondary | ICD-10-CM | POA: Diagnosis not present

## 2023-09-20 DIAGNOSIS — M25651 Stiffness of right hip, not elsewhere classified: Secondary | ICD-10-CM

## 2023-09-20 DIAGNOSIS — M25652 Stiffness of left hip, not elsewhere classified: Secondary | ICD-10-CM

## 2023-09-20 DIAGNOSIS — M25561 Pain in right knee: Secondary | ICD-10-CM

## 2023-09-20 NOTE — Therapy (Signed)
OUTPATIENT PHYSICAL THERAPY  TREATMENT  Patient Name: Hannah Wallace MRN: [id number] DOB:[birth year], 67 y.o., female Today's Date: 09/20/2023  END OF SESSION:  PT End of Session - 09/20/23 1450     Visit Number 3    Number of Visits 9    Date for PT Re-Evaluation 10/18/23    Authorization Type Medicare B/Medicaid    PT Start Time 1401    PT Stop Time 1449    PT Time Calculation (min) 48 min    Activity Tolerance Patient tolerated treatment well    Behavior During Therapy WFL for tasks assessed/performed               Past Medical History:  Diagnosis Date   Basal cell carcinoma    on back   Cancer [facility])    breast cancer   Past Surgical History:  Procedure Laterality Date   BREAST LUMPECTOMY WITH RADIOACTIVE SEED AND SENTINEL LYMPH NODE BIOPSY Left 09/25/2019   Procedure: LEFT BREAST LUMPECTOMY WITH RADIOACTIVE SEED AND SENTINEL LYMPH NODE BIOPSY;  Surgeon: Almond Lint, MD;  Location: [facility] OR;  Service: General;  Laterality: Left;   COLONOSCOPY     PORT-A-CATH REMOVAL N/A 03/16/2020   Procedure: REMOVAL PORT-A-CATH;  Surgeon: Almond Lint, MD;  Location: [facility];  Service: General;  Laterality: N/A;   PORTACATH PLACEMENT Left 11/19/2019   Procedure: INSERTION PORT-A-CATH;  Surgeon: Almond Lint, MD;  Location: [facility];  Service: General;  Laterality: Left;   Patient Active Problem List   Diagnosis Date Noted   Port-A-Cath in place 11/20/2019   Malignant neoplasm of upper-outer quadrant of left breast in female, estrogen receptor positive (HCC) 09/09/2019    PCP: Irving Copas, PA-C  REFERRING PROVIDER: Serena Croissant, MD  REFERRING DIAG:  Diagnosis  C50.412,Z17.0 (ICD-10-CM) - Malignant neoplasm of upper-outer quadrant of left breast in female, estrogen receptor positive (HCC)   THERAPY DIAG:  Acute pain of right knee  Stiffness of left hip, not elsewhere classified  Stiffness of right hip, not elsewhere  classified  Difficulty in walking, not elsewhere classified  ONSET DATE: 11/06/22  Rationale for Evaluation and Treatment: Rehabilitation  SUBJECTIVE:                                                                                                                                                                                           SUBJECTIVE STATEMENT: My knee was excellent until I got out of the car and then it started hurting again. I did a lot of housework this morning.   PERTINENT HISTORY: Lt breast cancer hx on anastrazole; 2021 hx of L lumpectomy and  SLNB (0/5). 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  Past xrays of Rt hip showing OA.    PAIN: 09/18/23 Are you having pain? Yes  NPRS scale: 1/10 Pain location: Rt knee  Pain orientation: Bilateral  PAIN TYPE: feels swollen Pain description: intermittent  Aggravating factors: nothing Relieving factors: moving around   PRECAUTIONS: None  RED FLAGS: None   WEIGHT BEARING RESTRICTIONS: No  FALLS:  Has patient fallen in last 6 months? No  LIVING ENVIRONMENT: Lives with: lives with their family and lives alone Lives in: House/apartment Stairs: Yes; 2 steps into the house.  I have handles to be extra careful.   Has following equipment at home: None  OCCUPATION: Just retired this year.    LEISURE: walking, I would like to go to the gym with the medicare benefits now.    PRIOR LEVEL OF FUNCTION: Independent  PATIENT GOALS: I just want to be more flexible - I need to ride my jetski in the summer.    OBJECTIVE: Note: Objective measures were completed at Evaluation unless otherwise noted.  COGNITION: Overall cognitive status: Within functional limits for tasks assessed   PALPATION: No ttp to the knee joint structures  OBSERVATIONS / OTHER ASSESSMENTS: Rt knee slightly swollen compared to the left and lower leg swollen with +1 pitting at the shin.   POSTURE: SL stance WNL and able to hold 10sec.   Squat: WNL Heel raise  and Toe raise WNL   LOWER EXTREMITY STRENGTH:  MMT Right eval  Hip flexion 5  Hip extension 4+  Hip abduction 4+  Hip adduction   Hip internal rotation 5  Hip external rotation 5  Knee flexion 5  Knee extension 5  Ankle dorsiflexion   Ankle plantarflexion   Ankle inversion   Ankle eversion   Great toe extension    (Blank rows = not tested)  MMT LEFT eval  Hip flexion 5  Hip extension 4+  Hip abduction 4+  Hip adduction   Hip internal rotation 5  Hip external rotation 5  Knee flexion 5  Knee extension 5  Ankle dorsiflexion   Ankle plantarflexion   Ankle inversion   Ankle eversion   Great toe extension     (Blank rows = not tested)  FUNCTIONAL TESTS:   GAIT: gait in clinic hallway: noted more foot flat strike on the Rt leg with slightly antalgic gait.  Able to correct with vcs for heel to toe  Outcome measure: LEFS:  55/80  TODAY'S TREATMENT:                                                                                                                                         DATE: 09/20/23 NuStep: Level 4x 10 minutes, seat at 11, no UEs, no increase in pain Seated hamstring and figure 4 stretch 3x30 seconds  Supine butterfly stretch 2x30 seconds  Seated edge of  mat - adductor stretch with 1 leg on mat x 2 reps bilaterally x 30 sec holds 3 way hip machine x 25 lbs x 10 reps in each direction - no increase in pain Sit to stand 2x10 with purple pad to raise mat up  Gastroc stretch at wall 2x30 seconds  Step ups on 8'' step with controlled descent x 10 reps on each Air ex balance beam with occasional finger tip assist on // bars x 4 reps with pt feeling challenged by this  DATE: 09/18/23 NuStep: Level 4x 6 minutes- PT present to discuss progress Seated hamstring and figure 4 stretch 3x20 seconds  Supine butterfly stretch 2x20 seconds  Sit to stand 2x10 Gastroc stretch at wall 2x20 seconds  Discussed walking program  Farmer's carry with heel to toe gait around  clinic- verbal and visual feedback for symmetry and heel strike DATE: 09/13/23 TE: Initial HEP education per below - HEP code lost before copying MT: Education on MLD for the Rt LE as pt is aware of how to do MLD for the Lt chest and arm area.  Discussed elevation, compression stockings and measured pt for ames walker knee high with handout on ordering and on ETI in [place].   Discussed pool which pt already does.    PATIENT EDUCATION:  Education details: Access Code: [email or web address] Person educated: Patient Education method: Explanation, Demonstration, Tactile cues, Verbal cues, and Handouts Education comprehension: verbalized understanding and returned demonstration  HOME EXERCISE PROGRAM: Access Code: [email or web address] URL: [email or web address] Date: 09/20/2023 Prepared by: Leonette Most  Exercises - Supine Butterfly Groin Stretch  - 2-3 x daily - 7 x weekly - 1 sets - 3 reps - 30 hold - Supine Piriformis Stretch Pulling Heel to Hip  - 2-3 x daily - 7 x weekly - 1 sets - 3 reps - 30 hold - Seated Hamstring Stretch  - 2-3 x daily - 7 x weekly - 1 sets - 3 reps - 30 hold - Gastroc Stretch on Wall  - 2-3 x daily - 7 x weekly - 1 sets - 3 reps - 20-30 hold - Supine Active Straight Leg Raise  - 1 x daily - 7 x weekly - 2 sets - 10 reps - Long Sitting Hip Adductor Stretch  - 1 x daily - 7 x weekly - 1 sets - 3 reps - 30 sec hold  ASSESSMENT:  CLINICAL IMPRESSION: Pt has been compliant with her HEP. Added another hip adductor stretch to her current routine. Began hip strengthening today as well as high level balance  which pt was challenged by. Will assess her pain level at next session and progress weight with hip machine at next session if she did not have any increaed pain after today's session.   OBJECTIVE IMPAIRMENTS: Abnormal gait, decreased activity tolerance, decreased knowledge of use of DME, difficulty walking, and impaired flexibility.   ACTIVITY LIMITATIONS: squatting  and locomotion level  PARTICIPATION LIMITATIONS: cleaning, community activity, and yard work  PERSONAL FACTORS:  none  are also affecting patient's functional outcome.   REHAB POTENTIAL: Excellent  CLINICAL DECISION MAKING: Stable/uncomplicated  EVALUATION COMPLEXITY: Moderate    GOALS: Goals reviewed with patient? Yes    LONG TERM GOALS: Target date: 10/21/23  Pt will return to walking heel - toe pattern for a more normal gait pattern Baseline:  Goal status: INITIAL  2.  Pt will decrease Rt knee and lower leg edema to non-pitting to improve mobility  Baseline:  Goal status: INITIAL  3.  Pt will return to walking without limitations  Baseline:  Goal status: INITIAL  4.  Pt will be ind with hip stretches for continued mobility  Baseline:  Goal status: INITIAL   PLAN:  PT FREQUENCY: 1-2x/week  PT DURATION: 4 weeks (5 added to POC for timing of visits)  PLANNED INTERVENTIONS: 97164- PT Re-evaluation, 97110-Therapeutic exercises, 97530- Therapeutic activity, 97112- Neuromuscular re-education, 97535- Self Care, 16109- Manual therapy, Patient/Family education, Balance training, Taping, Joint mobilization, Manual lymph drainage, DME instructions, Therapeutic exercises, Therapeutic activity, Neuromuscular re-education, Gait training, and Self Care  PLAN FOR NEXT SESSION: work on hamstring and gastroc stretch, heel to toe gait, Rt knee and hip strength and stability   Lorrene Reid, PT 09/20/23 2:50 PM

## 2023-09-24 ENCOUNTER — Ambulatory Visit: Payer: Medicare Other | Admitting: Physical Therapy

## 2023-09-24 ENCOUNTER — Encounter: Payer: Self-pay | Admitting: Physical Therapy

## 2023-09-24 DIAGNOSIS — R262 Difficulty in walking, not elsewhere classified: Secondary | ICD-10-CM

## 2023-09-24 DIAGNOSIS — C50412 Malignant neoplasm of upper-outer quadrant of left female breast: Secondary | ICD-10-CM | POA: Diagnosis not present

## 2023-09-24 DIAGNOSIS — M25651 Stiffness of right hip, not elsewhere classified: Secondary | ICD-10-CM

## 2023-09-24 DIAGNOSIS — M25652 Stiffness of left hip, not elsewhere classified: Secondary | ICD-10-CM

## 2023-09-24 DIAGNOSIS — M25561 Pain in right knee: Secondary | ICD-10-CM

## 2023-09-24 NOTE — Therapy (Signed)
OUTPATIENT PHYSICAL THERAPY  TREATMENT  Patient Name: Hannah Wallace MRN: [id number] DOB:1956/05/19, 67 y.o., female Today's Date: 09/24/2023  END OF SESSION:  PT End of Session - 09/24/23 0805     Visit Number 4    Number of Visits 9    Date for PT Re-Evaluation 10/18/23    Authorization Type Medicare B/Medicaid    PT Start Time 0803    PT Stop Time 0858    PT Time Calculation (min) 55 min    Activity Tolerance Patient tolerated treatment well    Behavior During Therapy WFL for tasks assessed/performed               Past Medical History:  Diagnosis Date   Basal cell carcinoma    on back   Cancer [facility])    breast cancer   Past Surgical History:  Procedure Laterality Date   BREAST LUMPECTOMY WITH RADIOACTIVE SEED AND SENTINEL LYMPH NODE BIOPSY Left 09/25/2019   Procedure: LEFT BREAST LUMPECTOMY WITH RADIOACTIVE SEED AND SENTINEL LYMPH NODE BIOPSY;  Surgeon: Almond Lint, MD;  Location: [facility] OR;  Service: General;  Laterality: Left;   COLONOSCOPY     PORT-A-CATH REMOVAL N/A 03/16/2020   Procedure: REMOVAL PORT-A-CATH;  Surgeon: Almond Lint, MD;  Location: [facility];  Service: General;  Laterality: N/A;   PORTACATH PLACEMENT Left 11/19/2019   Procedure: INSERTION PORT-A-CATH;  Surgeon: Almond Lint, MD;  Location: [facility];  Service: General;  Laterality: Left;   Patient Active Problem List   Diagnosis Date Noted   Port-A-Cath in place 11/20/2019   Malignant neoplasm of upper-outer quadrant of left breast in female, estrogen receptor positive (HCC) 09/09/2019    PCP: Irving Copas, PA-C  REFERRING PROVIDER: Serena Croissant, MD  REFERRING DIAG:  Diagnosis  C50.412,Z17.0 (ICD-10-CM) - Malignant neoplasm of upper-outer quadrant of left breast in female, estrogen receptor positive (HCC)   THERAPY DIAG:  Acute pain of right knee  Stiffness of left hip, not elsewhere classified  Stiffness of right hip, not elsewhere  classified  Difficulty in walking, not elsewhere classified  ONSET DATE: 11/06/22  Rationale for Evaluation and Treatment: Rehabilitation  SUBJECTIVE:                                                                                                                                                                                           SUBJECTIVE STATEMENT: I have not had a sharp pain in my knee since Thursday.   PERTINENT HISTORY: Lt breast cancer hx on anastrazole; 2021 hx of L lumpectomy and SLNB (0/5). 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  Past xrays of  Rt hip showing OA.    PAIN: 09/18/23 Are you having pain? No pt reports she just has some stiffness in both knees this morning  PRECAUTIONS: None  RED FLAGS: None   WEIGHT BEARING RESTRICTIONS: No  FALLS:  Has patient fallen in last 6 months? No  LIVING ENVIRONMENT: Lives with: lives with their family and lives alone Lives in: House/apartment Stairs: Yes; 2 steps into the house.  I have handles to be extra careful.   Has following equipment at home: None  OCCUPATION: Just retired this year.    LEISURE: walking, I would like to go to the gym with the medicare benefits now.    PRIOR LEVEL OF FUNCTION: Independent  PATIENT GOALS: I just want to be more flexible - I need to ride my jetski in the summer.    OBJECTIVE: Note: Objective measures were completed at Evaluation unless otherwise noted.  COGNITION: Overall cognitive status: Within functional limits for tasks assessed   PALPATION: No ttp to the knee joint structures  OBSERVATIONS / OTHER ASSESSMENTS: Rt knee slightly swollen compared to the left and lower leg swollen with +1 pitting at the shin.   POSTURE: SL stance WNL and able to hold 10sec.   Squat: WNL Heel raise and Toe raise WNL   LOWER EXTREMITY STRENGTH:  MMT Right eval  Hip flexion 5  Hip extension 4+  Hip abduction 4+  Hip adduction   Hip internal rotation 5  Hip external rotation 5  Knee  flexion 5  Knee extension 5  Ankle dorsiflexion   Ankle plantarflexion   Ankle inversion   Ankle eversion   Great toe extension    (Blank rows = not tested)  MMT LEFT eval  Hip flexion 5  Hip extension 4+  Hip abduction 4+  Hip adduction   Hip internal rotation 5  Hip external rotation 5  Knee flexion 5  Knee extension 5  Ankle dorsiflexion   Ankle plantarflexion   Ankle inversion   Ankle eversion   Great toe extension     (Blank rows = not tested)  FUNCTIONAL TESTS:   GAIT: gait in clinic hallway: noted more foot flat strike on the Rt leg with slightly antalgic gait.  Able to correct with vcs for heel to toe  Outcome measure: LEFS:  55/80  TODAY'S TREATMENT:                                                                                                                                         DATE:  09/24/23 NuStep: Level 4x 11 minutes, seat at 11, no UEs, no increase in pain Seated hamstring and figure 4 stretch 3 x 60 seconds  Supine butterfly stretch 2 x 60 seconds  Adductor stretch on mat with 1 leg out and the other in butterfly position x 60 sec holds x 3 bilaterally 3 way hip machine x 30 lbs  x 10 reps in each direction - no increase in pain Leg press seat at 7 with 85 lbs x 15 reps Sit to stand 2x10 without purple pad to raise mat up - great form Gastroc stretch at wall 2x30 seconds  Air ex balance beam with occasional finger tip assist on // bars x 4 reps with improvement noted since last session  09/20/23 NuStep: Level 4x 10 minutes, seat at 11, no UEs, no increase in pain Seated hamstring and figure 4 stretch 3x30 seconds  Supine butterfly stretch 2x30 seconds  Seated edge of mat - adductor stretch with 1 leg on mat x 2 reps bilaterally x 30 sec holds 3 way hip machine x 25 lbs x 10 reps in each direction - no increase in pain Sit to stand 2x10 with purple pad to raise mat up  Gastroc stretch at wall 2x30 seconds  Step ups on 8'' step with controlled  descent x 10 reps on each Air ex balance beam with occasional finger tip assist on // bars x 4 reps with pt feeling challenged by this  DATE: 09/18/23 NuStep: Level 4x 6 minutes- PT present to discuss progress Seated hamstring and figure 4 stretch 3x20 seconds  Supine butterfly stretch 2x20 seconds  Sit to stand 2x10 Gastroc stretch at wall 2x20 seconds  Discussed walking program  Farmer's carry with heel to toe gait around clinic- verbal and visual feedback for symmetry and heel strike DATE: 09/13/23 TE: Initial HEP education per below - HEP code lost before copying MT: Education on MLD for the Rt LE as pt is aware of how to do MLD for the Lt chest and arm area.  Discussed elevation, compression stockings and measured pt for ames walker knee high with handout on ordering and on [employer] in [place].   Discussed pool which pt already does.    PATIENT EDUCATION:  Education details: Access Code: [email or web address] Person educated: Patient Education method: Explanation, Demonstration, Tactile cues, Verbal cues, and Handouts Education comprehension: verbalized understanding and returned demonstration  HOME EXERCISE PROGRAM: Access Code: [email or web address] URL: [email or web address] Date: 09/24/2023 Prepared by: Leonette Most  Exercises - Supine Butterfly Groin Stretch  - 2-3 x daily - 7 x weekly - 1 sets - 3 reps - 30 hold - Supine Piriformis Stretch Pulling Heel to Hip  - 2-3 x daily - 7 x weekly - 1 sets - 3 reps - 30 hold - Seated Hamstring Stretch  - 2-3 x daily - 7 x weekly - 1 sets - 3 reps - 30 hold - Gastroc Stretch on Wall  - 2-3 x daily - 7 x weekly - 1 sets - 3 reps - 20-30 hold - Supine Active Straight Leg Raise  - 1 x daily - 7 x weekly - 2 sets - 10 reps - Long Sitting Hip Adductor Stretch  - 1 x daily - 7 x weekly - 1 sets - 3 reps - 30 sec hold - Seated Hamstring Stretch  - 1 x daily - 7 x weekly - 1 sets - 3 reps - 30-60 sec  hold  ASSESSMENT:  CLINICAL  IMPRESSION: Pt reports no sharp pain since last Thursday. Increased stretch hold time to 60 sec for all stretches. Added hamstring stretch to her HEP. Added leg press today and increased resistance on 3 way hip. Pt is doing very well with her form during exercises. Will continue to progress balance exercises.   OBJECTIVE IMPAIRMENTS: Abnormal gait, decreased activity tolerance, decreased knowledge of use of  DME, difficulty walking, and impaired flexibility.   ACTIVITY LIMITATIONS: squatting and locomotion level  PARTICIPATION LIMITATIONS: cleaning, community activity, and yard work  PERSONAL FACTORS:  none  are also affecting patient's functional outcome.   REHAB POTENTIAL: Excellent  CLINICAL DECISION MAKING: Stable/uncomplicated  EVALUATION COMPLEXITY: Moderate    GOALS: Goals reviewed with patient? Yes    LONG TERM GOALS: Target date: 10/21/23  Pt will return to walking heel - toe pattern for a more normal gait pattern Baseline:  Goal status: INITIAL  2.  Pt will decrease Rt knee and lower leg edema to non-pitting to improve mobility  Baseline:  Goal status: INITIAL  3.  Pt will return to walking without limitations  Baseline:  Goal status: INITIAL  4.  Pt will be ind with hip stretches for continued mobility  Baseline:  Goal status: INITIAL   PLAN:  PT FREQUENCY: 1-2x/week  PT DURATION: 4 weeks (5 added to POC for timing of visits)  PLANNED INTERVENTIONS: 97164- PT Re-evaluation, 97110-Therapeutic exercises, 97530- Therapeutic activity, 97112- Neuromuscular re-education, 97535- Self Care, 57846- Manual therapy, Patient/Family education, Balance training, Taping, Joint mobilization, Manual lymph drainage, DME instructions, Therapeutic exercises, Therapeutic activity, Neuromuscular re-education, Gait training, and Self Care  PLAN FOR NEXT SESSION: work on hamstring and gastroc stretch, heel to toe gait, Rt knee and hip strength and stability   Lorrene Reid,  PT 09/24/23 9:01 AM

## 2023-09-26 ENCOUNTER — Ambulatory Visit: Payer: Medicare Other

## 2023-09-26 DIAGNOSIS — M25651 Stiffness of right hip, not elsewhere classified: Secondary | ICD-10-CM

## 2023-09-26 DIAGNOSIS — M25561 Pain in right knee: Secondary | ICD-10-CM

## 2023-09-26 DIAGNOSIS — M25652 Stiffness of left hip, not elsewhere classified: Secondary | ICD-10-CM

## 2023-09-26 DIAGNOSIS — C50412 Malignant neoplasm of upper-outer quadrant of left female breast: Secondary | ICD-10-CM | POA: Diagnosis not present

## 2023-09-26 DIAGNOSIS — R262 Difficulty in walking, not elsewhere classified: Secondary | ICD-10-CM

## 2023-09-26 NOTE — Therapy (Signed)
OUTPATIENT PHYSICAL THERAPY  TREATMENT  Patient Name: Hannah Wallace MRN: [id number] DOB:[birth year], 67 y.o., female Today's Date: 09/26/2023  END OF SESSION:  PT End of Session - 09/26/23 1530     Visit Number 5    Date for PT Re-Evaluation 10/18/23    Authorization Type Medicare B/Medicaid    PT Start Time 1448    PT Stop Time 1530    PT Time Calculation (min) 42 min    Activity Tolerance Patient tolerated treatment well    Behavior During Therapy WFL for tasks assessed/performed                Past Medical History:  Diagnosis Date   Basal cell carcinoma    on back   Cancer [facility])    breast cancer   Past Surgical History:  Procedure Laterality Date   BREAST LUMPECTOMY WITH RADIOACTIVE SEED AND SENTINEL LYMPH NODE BIOPSY Left 09/25/2019   Procedure: LEFT BREAST LUMPECTOMY WITH RADIOACTIVE SEED AND SENTINEL LYMPH NODE BIOPSY;  Surgeon: Almond Lint, MD;  Location: [facility] OR;  Service: General;  Laterality: Left;   COLONOSCOPY     PORT-A-CATH REMOVAL N/A 03/16/2020   Procedure: REMOVAL PORT-A-CATH;  Surgeon: Almond Lint, MD;  Location: [facility];  Service: General;  Laterality: N/A;   PORTACATH PLACEMENT Left 11/19/2019   Procedure: INSERTION PORT-A-CATH;  Surgeon: Almond Lint, MD;  Location: [facility];  Service: General;  Laterality: Left;   Patient Active Problem List   Diagnosis Date Noted   Port-A-Cath in place 11/20/2019   Malignant neoplasm of upper-outer quadrant of left breast in female, estrogen receptor positive (HCC) 09/09/2019    PCP: Irving Copas, PA-C  REFERRING PROVIDER: Serena Croissant, MD  REFERRING DIAG:  Diagnosis  C50.412,Z17.0 (ICD-10-CM) - Malignant neoplasm of upper-outer quadrant of left breast in female, estrogen receptor positive (HCC)   THERAPY DIAG:  Acute pain of right knee  Stiffness of left hip, not elsewhere classified  Stiffness of right hip, not elsewhere classified  Difficulty in walking,  not elsewhere classified  ONSET DATE: 11/06/22  Rationale for Evaluation and Treatment: Rehabilitation  SUBJECTIVE:                                                                                                                                                                                           SUBJECTIVE STATEMENT: I bought a knee sleeve and have been wearing it.  I haven't noticed a difference. I hope it will help my swelling.   I need to go to my water class an additional day because that really helps.    PERTINENT HISTORY: Lt breast  cancer hx on anastrazole; 2021 hx of L lumpectomy and SLNB (0/5). 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  Past xrays of Rt hip showing OA.    PAIN: 09/18/23 Are you having pain? No pt reports she just has some stiffness in both knees this morning  PRECAUTIONS: None  RED FLAGS: None   WEIGHT BEARING RESTRICTIONS: No  FALLS:  Has patient fallen in last 6 months? No  LIVING ENVIRONMENT: Lives with: lives with their family and lives alone Lives in: House/apartment Stairs: Yes; 2 steps into the house.  I have handles to be extra careful.   Has following equipment at home: None  OCCUPATION: Just retired this year.    LEISURE: walking, I would like to go to the gym with the medicare benefits now.    PRIOR LEVEL OF FUNCTION: Independent  PATIENT GOALS: I just want to be more flexible - I need to ride my jetski in the summer.    OBJECTIVE: Note: Objective measures were completed at Evaluation unless otherwise noted.  COGNITION: Overall cognitive status: Within functional limits for tasks assessed   PALPATION: No ttp to the knee joint structures  OBSERVATIONS / OTHER ASSESSMENTS: Rt knee slightly swollen compared to the left and lower leg swollen with +1 pitting at the shin.   POSTURE: SL stance WNL and able to hold 10sec.   Squat: WNL Heel raise and Toe raise WNL   LOWER EXTREMITY STRENGTH:  MMT Right eval  Hip flexion 5  Hip extension 4+   Hip abduction 4+  Hip adduction   Hip internal rotation 5  Hip external rotation 5  Knee flexion 5  Knee extension 5  Ankle dorsiflexion   Ankle plantarflexion   Ankle inversion   Ankle eversion   Great toe extension    (Blank rows = not tested)  MMT LEFT eval  Hip flexion 5  Hip extension 4+  Hip abduction 4+  Hip adduction   Hip internal rotation 5  Hip external rotation 5  Knee flexion 5  Knee extension 5  Ankle dorsiflexion   Ankle plantarflexion   Ankle inversion   Ankle eversion   Great toe extension     (Blank rows = not tested)  FUNCTIONAL TESTS:   GAIT: gait in clinic hallway: noted more foot flat strike on the Rt leg with slightly antalgic gait.  Able to correct with vcs for heel to toe  Outcome measure: LEFS:  55/80  TODAY'S TREATMENT:                                                                                                                                         DATE:  09/26/23 NuStep: Level 4x 11 minutes, seat at 11, no UEs, no increase in pain Standing hamstring stretch with power plate 1O10 seconds  Seated figure 4 2x30 seconds  Standing on balance pad: hip abduction 2x10 bil each  Leg press seat at 7 with 85 lbs 2x10, 50# Rt and Lt 2x10 Sit to stand 2x10 from mat table  Standing rockerboard x 3 minutes  Air ex balance beam with occasional finger tip assist on // bars x 4 reps with   09/24/23 NuStep: Level 4x 11 minutes, seat at 11, no UEs, no increase in pain Seated hamstring and figure 4 stretch 3 x 60 seconds  Supine butterfly stretch 2 x 60 seconds  Adductor stretch on mat with 1 leg out and the other in butterfly position x 60 sec holds x 3 bilaterally 3 way hip machine x 30 lbs x 10 reps in each direction - no increase in pain Leg press seat at 7 with 85 lbs x 15 reps Sit to stand 2x10 without purple pad to raise mat up - great form Gastroc stretch at wall 2x30 seconds  Air ex balance beam with occasional finger tip assist on //  bars x 4 reps with improvement noted since last session  09/20/23 NuStep: Level 4x 10 minutes, seat at 11, no UEs, no increase in pain Seated hamstring and figure 4 stretch 3x30 seconds  Supine butterfly stretch 2x30 seconds  Seated edge of mat - adductor stretch with 1 leg on mat x 2 reps bilaterally x 30 sec holds 3 way hip machine x 25 lbs x 10 reps in each direction - no increase in pain Sit to stand 2x10 with purple pad to raise mat up  Gastroc stretch at wall 2x30 seconds  Step ups on 8'' step with controlled descent x 10 reps on each Air ex balance beam with occasional finger tip assist on // bars x 4 reps with pt feeling challenged by this   PATIENT EDUCATION:  Education details: Access Code: [email or web address] Person educated: Patient Education method: Explanation, Demonstration, Tactile cues, Verbal cues, and Handouts Education comprehension: verbalized understanding and returned demonstration  HOME EXERCISE PROGRAM: Access Code: [email or web address] URL: [email or web address] Date: 09/24/2023 Prepared by: Leonette Most  Exercises - Supine Butterfly Groin Stretch  - 2-3 x daily - 7 x weekly - 1 sets - 3 reps - 30 hold - Supine Piriformis Stretch Pulling Heel to Hip  - 2-3 x daily - 7 x weekly - 1 sets - 3 reps - 30 hold - Seated Hamstring Stretch  - 2-3 x daily - 7 x weekly - 1 sets - 3 reps - 30 hold - Gastroc Stretch on Wall  - 2-3 x daily - 7 x weekly - 1 sets - 3 reps - 20-30 hold - Supine Active Straight Leg Raise  - 1 x daily - 7 x weekly - 2 sets - 10 reps - Long Sitting Hip Adductor Stretch  - 1 x daily - 7 x weekly - 1 sets - 3 reps - 30 sec hold - Seated Hamstring Stretch  - 1 x daily - 7 x weekly - 1 sets - 3 reps - 30-60 sec  hold  ASSESSMENT:  CLINICAL IMPRESSION: Pt hasn't had the sharp pain in about a week. She has been staying active with her HEP and is taking a water class each week. Improved quality of movement with sit to stand with improved  eccentric control from lower seat height.  Pt is working on heel to toe gait and requires intermittent cueing for this throughout session.  PT monitored pt throughout session.  Patient will benefit from skilled PT to address the below impairments and improve overall function.   OBJECTIVE IMPAIRMENTS: Abnormal gait,  decreased activity tolerance, decreased knowledge of use of DME, difficulty walking, and impaired flexibility.   ACTIVITY LIMITATIONS: squatting and locomotion level  PARTICIPATION LIMITATIONS: cleaning, community activity, and yard work  PERSONAL FACTORS:  none  are also affecting patient's functional outcome.   REHAB POTENTIAL: Excellent  CLINICAL DECISION MAKING: Stable/uncomplicated  EVALUATION COMPLEXITY: Moderate    GOALS: Goals reviewed with patient? Yes    LONG TERM GOALS: Target date: 10/21/23  Pt will return to walking heel - toe pattern for a more normal gait pattern Baseline:  Goal status: INITIAL  2.  Pt will decrease Rt knee and lower leg edema to non-pitting to improve mobility  Baseline:  Goal status: INITIAL  3.  Pt will return to walking without limitations  Baseline: hasn't returned to walking, has just been doing exercises (09/26/23) Goal status: In progress   4.  Pt will be ind with hip stretches for continued mobility  Baseline:  Goal status: MET (09/26/23)   PLAN:  PT FREQUENCY: 1-2x/week  PT DURATION: 4 weeks (5 added to POC for timing of visits)  PLANNED INTERVENTIONS: 40981- PT Re-evaluation, 97110-Therapeutic exercises, 97530- Therapeutic activity, 97112- Neuromuscular re-education, 97535- Self Care, 19147- Manual therapy, Patient/Family education, Balance training, Taping, Joint mobilization, Manual lymph drainage, DME instructions, Therapeutic exercises, Therapeutic activity, Neuromuscular re-education, Gait training, and Self Care  PLAN FOR NEXT SESSION: work on hamstring and gastroc stretch, heel to toe gait, Rt knee and hip  strength and stability   Lorrene Reid, PT 09/26/23 3:35 PM

## 2023-09-26 NOTE — Telephone Encounter (Signed)
Telephone call  

## 2023-10-01 ENCOUNTER — Encounter: Payer: Self-pay | Admitting: Rehabilitation

## 2023-10-01 ENCOUNTER — Ambulatory Visit: Payer: Medicare Other | Admitting: Rehabilitation

## 2023-10-01 DIAGNOSIS — M25651 Stiffness of right hip, not elsewhere classified: Secondary | ICD-10-CM

## 2023-10-01 DIAGNOSIS — R262 Difficulty in walking, not elsewhere classified: Secondary | ICD-10-CM

## 2023-10-01 DIAGNOSIS — C50412 Malignant neoplasm of upper-outer quadrant of left female breast: Secondary | ICD-10-CM | POA: Diagnosis not present

## 2023-10-01 DIAGNOSIS — M25561 Pain in right knee: Secondary | ICD-10-CM

## 2023-10-01 DIAGNOSIS — M25652 Stiffness of left hip, not elsewhere classified: Secondary | ICD-10-CM

## 2023-10-01 NOTE — Therapy (Signed)
OUTPATIENT PHYSICAL THERAPY  TREATMENT  Patient Name: Hannah Wallace MRN: [id number] DOB:07/28/1956, 67 y.o., female Today's Date: 10/01/2023  END OF SESSION:  PT End of Session - 10/01/23 1451     Visit Number 6    Number of Visits 9    Date for PT Re-Evaluation 10/18/23    Progress Note Due on Visit 10    PT Start Time 1400    PT Stop Time 1445    PT Time Calculation (min) 45 min    Activity Tolerance Patient tolerated treatment well    Behavior During Therapy WFL for tasks assessed/performed                 Past Medical History:  Diagnosis Date   Basal cell carcinoma    on back   Cancer [facility])    breast cancer   Past Surgical History:  Procedure Laterality Date   BREAST LUMPECTOMY WITH RADIOACTIVE SEED AND SENTINEL LYMPH NODE BIOPSY Left 09/25/2019   Procedure: LEFT BREAST LUMPECTOMY WITH RADIOACTIVE SEED AND SENTINEL LYMPH NODE BIOPSY;  Surgeon: Almond Lint, MD;  Location: [facility];  Service: General;  Laterality: Left;   COLONOSCOPY     PORT-A-CATH REMOVAL N/A 03/16/2020   Procedure: REMOVAL PORT-A-CATH;  Surgeon: Almond Lint, MD;  Location: [facility];  Service: General;  Laterality: N/A;   PORTACATH PLACEMENT Left 11/19/2019   Procedure: INSERTION PORT-A-CATH;  Surgeon: Almond Lint, MD;  Location: [facility];  Service: General;  Laterality: Left;   Patient Active Problem List   Diagnosis Date Noted   Port-A-Cath in place 11/20/2019   Malignant neoplasm of upper-outer quadrant of left breast in female, estrogen receptor positive (HCC) 09/09/2019    PCP: Irving Copas, PA-C  REFERRING PROVIDER: Serena Croissant, MD  REFERRING DIAG:  Diagnosis  C50.412,Z17.0 (ICD-10-CM) - Malignant neoplasm of upper-outer quadrant of left breast in female, estrogen receptor positive (HCC)   THERAPY DIAG:  Acute pain of right knee  Stiffness of left hip, not elsewhere classified  Stiffness of right hip, not elsewhere  classified  Difficulty in walking, not elsewhere classified  ONSET DATE: 11/06/22  Rationale for Evaluation and Treatment: Rehabilitation  SUBJECTIVE:                                                                                                                                                                                           SUBJECTIVE STATEMENT: I bought a knee sleeve and have been wearing it. I had a little return of the sharp pain but I think because I did too much this weekend.   PERTINENT HISTORY: Lt breast cancer hx  on anastrazole; 2021 hx of L lumpectomy and SLNB (0/5). 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  Past xrays of Rt hip showing OA.    PAIN: 09/18/23 Are you having pain? No pt reports she just has some stiffness in both knees this morning  PRECAUTIONS: None  RED FLAGS: None   WEIGHT BEARING RESTRICTIONS: No  FALLS:  Has patient fallen in last 6 months? No  LIVING ENVIRONMENT: Lives with: lives with their family and lives alone Lives in: House/apartment Stairs: Yes; 2 steps into the house.  I have handles to be extra careful.   Has following equipment at home: None  OCCUPATION: Just retired this year.    LEISURE: walking, I would like to go to the gym with the medicare benefits now.    PRIOR LEVEL OF FUNCTION: Independent  PATIENT GOALS: I just want to be more flexible - I need to ride my jetski in the summer.    OBJECTIVE: Note: Objective measures were completed at Evaluation unless otherwise noted.  COGNITION: Overall cognitive status: Within functional limits for tasks assessed   PALPATION: No ttp to the knee joint structures  OBSERVATIONS / OTHER ASSESSMENTS: Rt knee slightly swollen compared to the left and lower leg swollen with +1 pitting at the shin.   POSTURE: SL stance WNL and able to hold 10sec.   Squat: WNL Heel raise and Toe raise WNL   LOWER EXTREMITY STRENGTH:  MMT Right eval  Hip flexion 5  Hip extension 4+  Hip abduction 4+   Hip adduction   Hip internal rotation 5  Hip external rotation 5  Knee flexion 5  Knee extension 5  Ankle dorsiflexion   Ankle plantarflexion   Ankle inversion   Ankle eversion   Great toe extension    (Blank rows = not tested)  MMT LEFT eval  Hip flexion 5  Hip extension 4+  Hip abduction 4+  Hip adduction   Hip internal rotation 5  Hip external rotation 5  Knee flexion 5  Knee extension 5  Ankle dorsiflexion   Ankle plantarflexion   Ankle inversion   Ankle eversion   Great toe extension     (Blank rows = not tested)  FUNCTIONAL TESTS:   GAIT: gait in clinic hallway: noted more foot flat strike on the Rt leg with slightly antalgic gait.  Able to correct with vcs for heel to toe  Outcome measure: LEFS:  55/80  TODAY'S TREATMENT:                                                                                                                                         DATE:  10/01/23 NuStep: Level 4x 11 minutes, seat at 11, no UEs, no increase in pain Standing hamstring stretch with power plate 4P32 seconds bil Supine hamstring stretch with strap bil 2x30" each with initial instruction  figure 4 2x30 seconds bil  pulling both legs toward chest for more stretch  Supine single and double leg adduction 3x20"  Standing on balance pad: hip abduction 2x10 bil each Standing rockerboard x 30" off and on x SL stance work on purple foam 3xmax bil Air ex balance beam with occasional finger tip assist on // bars x 4 reps with  Sidelying hip abduction red band x 10 with manual PT hold of hip for form - added to HEP  09/26/23 NuStep: Level 4x 11 minutes, seat at 11, no UEs, no increase in pain Standing hamstring stretch with power plate 7W29 seconds  Seated figure 4 2x30 seconds  Standing on balance pad: hip abduction 2x10 bil each Leg press seat at 7 with 85 lbs 2x10, 50# Rt and Lt 2x10 Sit to stand 2x10 from mat table  Standing rockerboard x 3 minutes  Air ex balance beam  with occasional finger tip assist on // bars x 4 reps with   09/24/23 NuStep: Level 4x 11 minutes, seat at 11, no UEs, no increase in pain Seated hamstring and figure 4 stretch 3 x 60 seconds  Supine butterfly stretch 2 x 60 seconds  Adductor stretch on mat with 1 leg out and the other in butterfly position x 60 sec holds x 3 bilaterally 3 way hip machine x 30 lbs x 10 reps in each direction - no increase in pain Leg press seat at 7 with 85 lbs x 15 reps Sit to stand 2x10 without purple pad to raise mat up - great form Gastroc stretch at wall 2x30 seconds  Air ex balance beam with occasional finger tip assist on // bars x 4 reps with improvement noted since last session  09/20/23 NuStep: Level 4x 10 minutes, seat at 11, no UEs, no increase in pain Seated hamstring and figure 4 stretch 3x30 seconds  Supine butterfly stretch 2x30 seconds  Seated edge of mat - adductor stretch with 1 leg on mat x 2 reps bilaterally x 30 sec holds 3 way hip machine x 25 lbs x 10 reps in each direction - no increase in pain Sit to stand 2x10 with purple pad to raise mat up  Gastroc stretch at wall 2x30 seconds  Step ups on 8'' step with controlled descent x 10 reps on each Air ex balance beam with occasional finger tip assist on // bars x 4 reps with pt feeling challenged by this   PATIENT EDUCATION:  Education details: Access Code: [email or web address] Person educated: Patient Education method: Explanation, Demonstration, Tactile cues, Verbal cues, and Handouts Education comprehension: verbalized understanding and returned demonstration  HOME EXERCISE PROGRAM: Access Code: [email or web address] URL: [email or web address] Date: 09/24/2023 Prepared by: Leonette Most  Exercises - Supine Butterfly Groin Stretch  - 2-3 x daily - 7 x weekly - 1 sets - 3 reps - 30 hold - Supine Piriformis Stretch Pulling Heel to Hip  - 2-3 x daily - 7 x weekly - 1 sets - 3 reps - 30 hold - Seated Hamstring Stretch  - 2-3 x  daily - 7 x weekly - 1 sets - 3 reps - 30 hold - Gastroc Stretch on Wall  - 2-3 x daily - 7 x weekly - 1 sets - 3 reps - 20-30 hold - Supine Active Straight Leg Raise  - 1 x daily - 7 x weekly - 2 sets - 10 reps - Long Sitting Hip Adductor Stretch  - 1 x daily - 7 x weekly - 1 sets - 3 reps -  30 sec hold - Seated Hamstring Stretch  - 1 x daily - 7 x weekly - 1 sets - 3 reps - 30-60 sec  hold -Sidelying hip abduction - red band  ASSESSMENT:  CLINICAL IMPRESSION: Pt had the sharp pain a few times this weekend but not today, mainly from level of activity.  Note  PT monitored pt throughout session.  Patient will benefit from skilled PT to address the below impairments and improve overall function.   OBJECTIVE IMPAIRMENTS: Abnormal gait, decreased activity tolerance, decreased knowledge of use of DME, difficulty walking, and impaired flexibility.   ACTIVITY LIMITATIONS: squatting and locomotion level  PARTICIPATION LIMITATIONS: cleaning, community activity, and yard work  PERSONAL FACTORS:  none  are also affecting patient's functional outcome.   REHAB POTENTIAL: Excellent  CLINICAL DECISION MAKING: Stable/uncomplicated  EVALUATION COMPLEXITY: Moderate    GOALS: Goals reviewed with patient? Yes    LONG TERM GOALS: Target date: 10/21/23  Pt will return to walking heel - toe pattern for a more normal gait pattern Baseline:  Goal status: INITIAL  2.  Pt will decrease Rt knee and lower leg edema to non-pitting to improve mobility  Baseline:  Goal status: INITIAL  3.  Pt will return to walking without limitations  Baseline: hasn't returned to walking, has just been doing exercises (09/26/23) Goal status: In progress   4.  Pt will be ind with hip stretches for continued mobility  Baseline:  Goal status: MET (09/26/23)   PLAN:  PT FREQUENCY: 1-2x/week  PT DURATION: 4 weeks (5 added to POC for timing of visits)  PLANNED INTERVENTIONS: 78469- PT Re-evaluation,  97110-Therapeutic exercises, 97530- Therapeutic activity, 97112- Neuromuscular re-education, 97535- Self Care, 62952- Manual therapy, Patient/Family education, Balance training, Taping, Joint mobilization, Manual lymph drainage, DME instructions, Therapeutic exercises, Therapeutic activity, Neuromuscular re-education, Gait training, and Self Care  PLAN FOR NEXT SESSION: work on hamstring and gastroc stretch, heel to toe gait, Rt knee and hip strength and stability   Lorrene Reid, PT 10/01/23 2:52 PM

## 2023-10-09 ENCOUNTER — Encounter: Payer: Self-pay | Admitting: Rehabilitative and Restorative Service Providers"

## 2023-10-09 ENCOUNTER — Ambulatory Visit: Payer: Medicare Other | Admitting: Rehabilitation

## 2023-10-09 ENCOUNTER — Ambulatory Visit
Payer: Medicare Other | Attending: Hematology and Oncology | Admitting: Rehabilitative and Restorative Service Providers"

## 2023-10-09 DIAGNOSIS — M25561 Pain in right knee: Secondary | ICD-10-CM | POA: Insufficient documentation

## 2023-10-09 DIAGNOSIS — M25651 Stiffness of right hip, not elsewhere classified: Secondary | ICD-10-CM | POA: Diagnosis present

## 2023-10-09 DIAGNOSIS — M25652 Stiffness of left hip, not elsewhere classified: Secondary | ICD-10-CM | POA: Diagnosis present

## 2023-10-09 DIAGNOSIS — R262 Difficulty in walking, not elsewhere classified: Secondary | ICD-10-CM | POA: Diagnosis present

## 2023-10-09 NOTE — Therapy (Signed)
OUTPATIENT PHYSICAL THERAPY TREATMENT NOTE AND DISCHARGE SUMMARY  Patient Name: Hannah Wallace MRN: [id number] DOB:08-22-1956, 67 y.o., female Today's Date: 10/09/2023  END OF SESSION:  PT End of Session - 10/09/23 1147     Visit Number 7    Date for PT Re-Evaluation 10/18/23    Authorization Type Medicare B/Medicaid    Progress Note Due on Visit 10    PT Start Time 1145    PT Stop Time 1225    PT Time Calculation (min) 40 min    Activity Tolerance Patient tolerated treatment well    Behavior During Therapy WFL for tasks assessed/performed                 Past Medical History:  Diagnosis Date   Basal cell carcinoma    on back   Cancer [facility])    breast cancer   Past Surgical History:  Procedure Laterality Date   BREAST LUMPECTOMY WITH RADIOACTIVE SEED AND SENTINEL LYMPH NODE BIOPSY Left 09/25/2019   Procedure: LEFT BREAST LUMPECTOMY WITH RADIOACTIVE SEED AND SENTINEL LYMPH NODE BIOPSY;  Surgeon: Almond Lint, MD;  Location: [facility];  Service: General;  Laterality: Left;   COLONOSCOPY     PORT-A-CATH REMOVAL N/A 03/16/2020   Procedure: REMOVAL PORT-A-CATH;  Surgeon: Almond Lint, MD;  Location: [facility];  Service: General;  Laterality: N/A;   PORTACATH PLACEMENT Left 11/19/2019   Procedure: INSERTION PORT-A-CATH;  Surgeon: Almond Lint, MD;  Location: [facility];  Service: General;  Laterality: Left;   Patient Active Problem List   Diagnosis Date Noted   Port-A-Cath in place 11/20/2019   Malignant neoplasm of upper-outer quadrant of left breast in female, estrogen receptor positive (HCC) 09/09/2019    PCP: Irving Copas, PA-C  REFERRING PROVIDER: Serena Croissant, MD  REFERRING DIAG:  Diagnosis  C50.412,Z17.0 (ICD-10-CM) - Malignant neoplasm of upper-outer quadrant of left breast in female, estrogen receptor positive (HCC)   THERAPY DIAG:  Acute pain of right knee  Stiffness of left hip, not elsewhere classified  Stiffness of  right hip, not elsewhere classified  Difficulty in walking, not elsewhere classified  ONSET DATE: 11/06/22  Rationale for Evaluation and Treatment: Rehabilitation  SUBJECTIVE:                                                                                                                                                                                           SUBJECTIVE STATEMENT:  Pt reports that she is feeling better and is ready to discharge today.  PERTINENT HISTORY: Lt breast cancer hx on anastrazole; 2021 hx of L lumpectomy and SLNB (0/5). xray of the  Rt knee showed OA.  Past xrays of Rt hip showing OA.    PAIN: 09/18/23 Are you having pain? No pt reports she just has some stiffness in both knees this morning  PRECAUTIONS: None  RED FLAGS: None   WEIGHT BEARING RESTRICTIONS: No  FALLS:  Has patient fallen in last 6 months? No  LIVING ENVIRONMENT: Lives with: lives with their family and lives alone Lives in: House/apartment Stairs: Yes; 2 steps into the house.  I have handles to be extra careful.   Has following equipment at home: None  OCCUPATION: Just retired this year.    LEISURE: walking, I would like to go to the gym with the medicare benefits now.    PRIOR LEVEL OF FUNCTION: Independent  PATIENT GOALS: I just want to be more flexible - I need to ride my jetski in the summer.    OBJECTIVE: Note: Objective measures were completed at Evaluation unless otherwise noted.  COGNITION: Overall cognitive status: Within functional limits for tasks assessed   PALPATION: No ttp to the knee joint structures  OBSERVATIONS / OTHER ASSESSMENTS: Rt knee slightly swollen compared to the left and lower leg swollen with +1 pitting at the shin.   POSTURE: SL stance WNL and able to hold 10sec.   Squat: WNL Heel raise and Toe raise WNL   LOWER EXTREMITY STRENGTH:  MMT Right eval  Hip flexion 5  Hip extension 4+  Hip abduction 4+  Hip adduction   Hip internal rotation  5  Hip external rotation 5  Knee flexion 5  Knee extension 5  Ankle dorsiflexion   Ankle plantarflexion   Ankle inversion   Ankle eversion   Great toe extension    (Blank rows = not tested)  MMT LEFT eval  Hip flexion 5  Hip extension 4+  Hip abduction 4+  Hip adduction   Hip internal rotation 5  Hip external rotation 5  Knee flexion 5  Knee extension 5  Ankle dorsiflexion   Ankle plantarflexion   Ankle inversion   Ankle eversion   Great toe extension     (Blank rows = not tested)  FUNCTIONAL TESTS:  Eval: Lower Extremity Functional Status:  55/80  10/09/2023: Lower Extremity Functional Score: 60 / 80 = 75.0 %  GAIT: gait in clinic hallway: noted more foot flat strike on the Rt leg with slightly antalgic gait.  Able to correct with vcs for heel to toe  Outcome measure: LEFS:  55/80  TODAY'S TREATMENT:                                                                                                                                         DATE:  10/09/2023 Nustep level 5 x6 min with PT present to discuss status (LE only) Seated hamstring stretch 2x20 sec bilat Seated piriformis stretch 2x20 sec bilat Standing on purple foam:  hip abduction, hip extension.  2x10 each bilat LEFS Single leg stance on purple foam 2x30 sec bilat Tandem stance on purple foam 2x20 sec bilat Gastroc stretch at wall 2x30 seconds  Standing L countertop stretch 2x20 sec Standing clamshells with red tband 2x10 Review of HEP and provided new handout   10/01/23 NuStep: Level 4x 11 minutes, seat at 11, no UEs, no increase in pain Standing hamstring stretch with power plate 6E45 seconds bil Supine hamstring stretch with strap bil 2x30" each with initial instruction  figure 4 2x30 seconds bil pulling both legs toward chest for more stretch  Supine single and double leg adduction 3x20"  Standing on balance pad: hip abduction 2x10 bil each Standing rockerboard x 30" off and on x SL stance  work on purple foam 3xmax bil Air ex balance beam with occasional finger tip assist on // bars x 4 reps with  Sidelying hip abduction red band x 10 with manual PT hold of hip for form - added to HEP  09/26/23 NuStep: Level 4x 11 minutes, seat at 11, no UEs, no increase in pain Standing hamstring stretch with power plate 4U98 seconds  Seated figure 4 2x30 seconds  Standing on balance pad: hip abduction 2x10 bil each Leg press seat at 7 with 85 lbs 2x10, 50# Rt and Lt 2x10 Sit to stand 2x10 from mat table  Standing rockerboard x 3 minutes  Air ex balance beam with occasional finger tip assist on // bars x 4 reps with     PATIENT EDUCATION:  Education details: Access Code: [email or web address] Person educated: Patient Education method: Explanation, Demonstration, Tactile cues, Verbal cues, and Handouts Education comprehension: verbalized understanding and returned demonstration  HOME EXERCISE PROGRAM: Access Code: [email or web address] URL: [email or web address] Date: 10/09/2023 Prepared by: Clydie Braun Chon Buhl  Exercises - Seated Hamstring Stretch  - 2-3 x daily - 7 x weekly - 1 sets - 2 reps - 20 sec hold - Seated Piriformis Stretch with Trunk Bend  - 1 x daily - 7 x weekly - 2 reps - 20 sec hold - Sit to Stand Without Arm Support  - 1 x daily - 7 x weekly - 2 sets - 10 reps - Gastroc Stretch on Wall  - 2-3 x daily - 7 x weekly - 1 sets - 3 reps - 20-30 hold - Standing 'L' Stretch at [employer]  - 1 x daily - 7 x weekly - 3 reps - 20 sec hold - Standing Hip Abduction with Counter Support  - 1 x daily - 7 x weekly - 3 sets - 10 reps - Standing Hip Extension with Counter Support  - 1 x daily - 7 x weekly - 2 sets - 10 reps - Standing Clamshell with Resistance  - 1 x daily - 7 x weekly - 2 sets - 10 reps - Single Leg Stance with Support  - 1 x daily - 7 x weekly - 2 reps - 20 sec hold - Tandem Stance with Support  - 1 x daily - 7 x weekly - 2 reps - 20 sec hold - Long Sitting Hip Adductor Stretch  - 1  x daily - 7 x weekly - 1 sets - 3 reps - 30 sec hold - Supine Butterfly Groin Stretch  - 2-3 x daily - 7 x weekly - 1 sets - 3 reps - 30 hold - Supine Piriformis Stretch Pulling Heel to Hip  - 2-3 x daily - 7 x weekly - 1 sets - 3 reps - 30 hold - Supine  Active Straight Leg Raise  - 1 x daily - 7 x weekly - 2 sets - 10 reps - Clamshell with Resistance  - 1 x daily - 5 x weekly - 1-3 sets - 10 reps - 2 sec hold  ASSESSMENT:  CLINICAL IMPRESSION: Ms Musch presents to skilled PT reporting that she thinks that she is ready to discharge from PT at this time.  Patient states that she has been able to resume her walking program at this time.  Patient with good participation and able to properly demonstrate her exercises.  Updated patient's HEP during session today.  Patient has met all goals at this time and is ready to discharge from skilled PT to continue with HEP.  OBJECTIVE IMPAIRMENTS: Abnormal gait, decreased activity tolerance, decreased knowledge of use of DME, difficulty walking, and impaired flexibility.   ACTIVITY LIMITATIONS: squatting and locomotion level  PARTICIPATION LIMITATIONS: cleaning, community activity, and yard work  PERSONAL FACTORS:  none  are also affecting patient's functional outcome.   REHAB POTENTIAL: Excellent  CLINICAL DECISION MAKING: Stable/uncomplicated  EVALUATION COMPLEXITY: Moderate    GOALS: Goals reviewed with patient? Yes    LONG TERM GOALS: Target date: 10/21/23  Pt will return to walking heel - toe pattern for a more normal gait pattern Baseline:  Goal status: MET  2.  Pt will decrease Rt knee and lower leg edema to non-pitting to improve mobility  Baseline:  Goal status: MET  3.  Pt will return to walking without limitations  Baseline: hasn't returned to walking, has just been doing exercises (09/26/23) Goal status: MET  4.  Pt will be ind with hip stretches for continued mobility  Baseline:  Goal status: MET  (09/26/23)   PLAN:  PT FREQUENCY: 1-2x/week  PT DURATION: 4 weeks (5 added to POC for timing of visits)  PLANNED INTERVENTIONS: 57846- PT Re-evaluation, 97110-Therapeutic exercises, 97530- Therapeutic activity, 97112- Neuromuscular re-education, 97535- Self Care, [id number]- Manual therapy, Patient/Family education, Balance training, Taping, Joint mobilization, Manual lymph drainage, DME instructions, Therapeutic exercises, Therapeutic activity, Neuromuscular re-education, Gait training, and Self Care   PHYSICAL THERAPY DISCHARGE SUMMARY  Patient agrees to discharge. Patient goals were met. Patient is being discharged due to meeting the stated rehab goals.     Reather Laurence, PT, DPT 10/09/23, 12:50 PM  [facility] [address] [place], [place] [address] Phone # [phone] Fax [phone]

## 2023-10-11 ENCOUNTER — Encounter: Payer: Medicare Other | Admitting: Rehabilitation

## 2023-10-15 ENCOUNTER — Encounter: Payer: Medicare Other | Admitting: Rehabilitation

## 2023-10-17 ENCOUNTER — Encounter: Payer: Medicare Other | Admitting: Rehabilitation

## 2023-11-08 ENCOUNTER — Other Ambulatory Visit: Payer: Self-pay | Admitting: *Deleted

## 2023-11-08 ENCOUNTER — Encounter: Payer: Self-pay | Admitting: Hematology and Oncology

## 2023-11-08 MED ORDER — LETROZOLE 2.5 MG PO TABS: 2.5000 mg | ORAL_TABLET | Freq: Every day | ORAL | 0 refills | Status: DC

## 2023-11-08 NOTE — Progress Notes (Signed)
 Received message from pt requesting to change AI therapy for Anastrozole .  Pt states she experiences joint pain and muscle pain while on Anastrozole .  Per MD pt to be prescribed Letrozole  2.5 mg p.o tablet daily.  MD requesting pt to f/u in 6 weeks to assess tolerance to Letrozole .  Prescription sent to pharmacy on file, scheduling notified, pt educated and verbalized understanding.

## 2023-12-19 NOTE — Assessment & Plan Note (Signed)
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 HER-2 - by FISH, ER+ 90%, PR+ 100%, Ki67 2%,  Oncotype DX score 26   Treatment plan:  1. Adjuvant chemotherapy with Taxotere and Cytoxan every 3 weeks x 4 completed 01/22/20 2.  Adjuvant radiation therapy 02/17/2020- 03/12/20 3.  Adjuvant antiestrogen therapy started April 06, 2020 changed to letrozole January 2025 (muscle aches and pains in hip and knee) ------------------------------------------------------------------------------------------------------------------------------------------------------------------- Letrozole toxicities:    Breast cancer surveillance:  Mammogram 08/03/2023 at [facility]: Benign Breast exam 09/12/2023: Benign   Return to clinic in 1 year for follow-up

## 2023-12-20 ENCOUNTER — Inpatient Hospital Stay: Payer: Medicare Other | Attending: Hematology and Oncology | Admitting: Hematology and Oncology

## 2023-12-20 DIAGNOSIS — C50412 Malignant neoplasm of upper-outer quadrant of left female breast: Secondary | ICD-10-CM | POA: Diagnosis not present

## 2023-12-20 DIAGNOSIS — Z170 Estrogen receptor positive status [ER+]: Secondary | ICD-10-CM | POA: Diagnosis not present

## 2023-12-20 NOTE — Progress Notes (Signed)
HEMATOLOGY-ONCOLOGY TELEPHONE VISIT PROGRESS NOTE  I connected with our patient on 12/20/23 at  8:15 AM EST by telephone and verified that I am speaking with the correct person using two identifiers.  I discussed the limitations, risks, security and privacy concerns of performing an evaluation and management service by telephone and the availability of in person appointments.  I also discussed with the patient that there may be a patient responsible charge related to this service. The patient expressed understanding and agreed to proceed.   History of Present Illness: Follow-up on letrozole therapy  History of Present Illness   Hannah Wallace is a 68 year old female who presents for follow-up regarding letrozole treatment.  She experiences persistent joint pain while on letrozole, with no significant change in severity. This is a known side effect of letrozole, an aromatase inhibitor used in breast cancer treatment.  She notes an improvement in her tiredness, stating that her energy levels are 'a little bit better.' She is unable to discern a significant difference overall but is 'doing fine' on the medication.  She inquires about the effects of letrozole and nitrozole on osteopenia, questioning if they have the same impact on bone health.  She mentions experiencing difficulty sleeping through the night, a problem she has had previously. She attributes this to her irregular schedule and is not currently taking any medication for it. She expresses no interest in starting medication for sleep issues at this time.        Oncology History  Malignant neoplasm of upper-outer quadrant of left breast in female, estrogen receptor positive (HCC)  09/03/2019 Initial Diagnosis   Routine screening mammogram detected a 1.4cm indeterminate left breast mass. US showed a 1.0cm left breast mass and a 0.5cm lymph node with cortical thickening at the 2:00 position in the left breast. Biopsy showed IDC, grade 1,  HER-2 - by FISH, ER+ 90%, PR+ 100%, Ki67 2%, with no malignancy in the lymph node.   09/10/2019 Cancer Staging   Staging form: Breast, AJCC 8th Edition - Clinical stage from 09/10/2019: Stage IA (cT1c, cN0, cM0, G2, ER+, PR+, HER2-)   09/25/2019 Surgery   Left lumpectomy [facility]) [phone]): IDC, grade 2, 1.2cm, with intermediate grade DCIS, 5 axillary lymph nodes negative, clear margins.    09/25/2019 Cancer Staging   Staging form: Breast, AJCC 8th Edition - Pathologic stage from 09/25/2019: Stage IA (pT1c, pN0, cM0, G2, ER+, PR+, HER2-)   11/03/2019 Oncotype testing   The Oncotype DX score was 26 predicting a risk of outside the breast recurrence over the next 9 years of 16% if the patient's only systemic therapy is tamoxifen for 5 years.    11/20/2019 - 01/22/2020 Chemotherapy   dexamethasone (DECADRON) 4 MG tablet, 4 mg (100 % of original dose 4 mg), Oral, Daily, 1 of 1 cycle, Start date: 11/06/2019, End date: 12/11/2019. Dose modification: 4 mg (original dose 4 mg, Cycle 0)  palonosetron (ALOXI) injection 0.25 mg, 0.25 mg, Intravenous,  Once, 4 of 4 cycles. Administration: 0.25 mg (11/20/2019), 0.25 mg (12/11/2019), 0.25 mg (01/01/2020), 0.25 mg (01/22/2020)  pegfilgrastim-jmdb (FULPHILA) injection 6 mg, 6 mg, Subcutaneous,  Once, 4 of 4 cycles. Administration: 6 mg (11/22/2019), 6 mg (12/13/2019), 6 mg (01/03/2020), 6 mg (01/24/2020)  cyclophosphamide (CYTOXAN) 1,260 mg in sodium chloride 0.9 % 250 mL chemo infusion, 600 mg/m2 = 1,260 mg, Intravenous,  Once, 4 of 4 cycles. Administration: 1,260 mg (11/20/2019), 1,260 mg (12/11/2019), 1,260 mg (01/01/2020), 1,260 mg (01/22/2020)  DOCEtaxel (TAXOTERE) 160 mg in  sodium chloride 0.9 % 250 mL chemo infusion, 75 mg/m2 = 160 mg, Intravenous,  Once, 4 of 4 cycles. Administration: 160 mg (11/20/2019), 160 mg (12/11/2019), 160 mg (01/01/2020), 160 mg (01/22/2020).   02/16/2020 - 03/12/2020 Radiation Therapy   The patient initially received a dose of 42.56 Gy in 16  fractions to the breast using whole-breast tangent fields. This was delivered using a 3-D conformal technique. The patient then received a boost to the seroma. This delivered an additional 10 Gy in 36fractions using an en face electron field due to the depth of the seroma. The total dose was 52.56Gy.   04/2020 - 04/2027 Anti-estrogen oral therapy   Anastrozole     REVIEW OF SYSTEMS:   Constitutional: Denies fevers, chills or abnormal weight loss All other systems were reviewed with the patient and are negative. Observations/Objective:     Assessment Plan:  Malignant neoplasm of upper-outer quadrant of left breast in female, estrogen receptor positive (HCC) 09/25/2019:Left lumpectomy ([facility]): IDC, grade 2, 1.2cm, with intermediate grade DCIS, 5 axillary lymph nodes negative, clear margins. HER-2 - by FISH, ER+ 90%, PR+ 100%, Ki67 2%,  Oncotype DX score 26   Treatment plan:  1. Adjuvant chemotherapy with Taxotere and Cytoxan every 3 weeks x 4 completed 01/22/20 2.  Adjuvant radiation therapy 02/17/2020- 03/12/20 3.  Adjuvant antiestrogen therapy started April 06, 2020 changed to letrozole January 2025 (muscle aches and pains in hip and knee) ------------------------------------------------------------------------------------------------------------------------------------------------------------------- Letrozole toxicities:  Joint pains same Fatigue better  Insomnia: no different.   Breast cancer surveillance:  Mammogram 08/03/2023 at [facility]: Benign Breast exam 09/12/2023: Benign   Return to clinic in 1 year for follow-up    I discussed the assessment and treatment plan with the patient. The patient was provided an opportunity to ask questions and all were answered. The patient agreed with the plan and demonstrated an understanding of the instructions. The patient was advised to call back or seek an in-person evaluation if the symptoms worsen or if the condition fails to improve as  anticipated.   I provided 20 minutes of non-face-to-face time during this encounter.  This includes time for charting and coordination of care   Tamsen Meek, MD

## 2024-01-11 ENCOUNTER — Other Ambulatory Visit: Payer: Self-pay | Admitting: *Deleted

## 2024-01-11 ENCOUNTER — Encounter: Payer: Self-pay | Admitting: Hematology and Oncology

## 2024-01-11 MED ORDER — ANASTROZOLE 1 MG PO TABS: 1.0000 mg | ORAL_TABLET | Freq: Every day | ORAL | 3 refills | Status: DC

## 2024-01-11 NOTE — Progress Notes (Signed)
 Received mychart message from pt stating she would like to swap back to Anastrozole.  Pt states while currently on Letrozole, she is experiencing more joint pain.  This joint pain is more severe, especially in her shoulders.  RN reviewed with MD and verbal orders received and placed for pt to be prescribed Anastrozole 1 mg p.o tablet daily.  Prescription sent to pharmacy on file, pt educated and verbalized understanding.

## 2024-01-23 ENCOUNTER — Other Ambulatory Visit: Payer: Self-pay | Admitting: Hematology and Oncology

## 2024-04-17 ENCOUNTER — Encounter (INDEPENDENT_AMBULATORY_CARE_PROVIDER_SITE_OTHER): Payer: Self-pay

## 2024-08-07 ENCOUNTER — Encounter: Payer: Self-pay | Admitting: Hematology and Oncology

## 2024-09-15 ENCOUNTER — Inpatient Hospital Stay: Payer: Medicare Other | Attending: Hematology and Oncology | Admitting: Hematology and Oncology

## 2024-09-15 VITALS — BP 140/82 | HR 85 | Temp 97.7°F | Resp 16 | Ht 69.5 in | Wt 212.7 lb

## 2024-09-15 DIAGNOSIS — Z170 Estrogen receptor positive status [ER+]: Secondary | ICD-10-CM | POA: Diagnosis not present

## 2024-09-15 DIAGNOSIS — D0512 Intraductal carcinoma in situ of left breast: Secondary | ICD-10-CM | POA: Insufficient documentation

## 2024-09-15 DIAGNOSIS — Z79811 Long term (current) use of aromatase inhibitors: Secondary | ICD-10-CM | POA: Diagnosis not present

## 2024-09-15 DIAGNOSIS — C50412 Malignant neoplasm of upper-outer quadrant of left female breast: Secondary | ICD-10-CM | POA: Diagnosis not present

## 2024-09-15 DIAGNOSIS — G4700 Insomnia, unspecified: Secondary | ICD-10-CM | POA: Diagnosis not present

## 2024-09-15 NOTE — Assessment & Plan Note (Signed)
 09/25/2019:Left lumpectomy [facility]): IDC, grade 2, 1.2cm, with intermediate grade DCIS, 5 axillary lymph nodes negative, clear margins. HER-2 - by FISH, ER+ 90%, PR+ 100%, Ki67 2%,  Oncotype DX score 26   Treatment plan:  1. Adjuvant chemotherapy with Taxotere  and Cytoxan  every 3 weeks x 4 completed 01/22/20 2.  Adjuvant radiation therapy 02/17/2020- 03/12/20 3.  Adjuvant antiestrogen therapy started April 06, 2020 changed to letrozole  January 2025 (muscle aches and pains in hip and knee), changed back to anastrozole  01/11/2024 because of joint pains on letrozole  but worse ------------------------------------------------------------------------------------------------------------------------------------------------------------------- Anastrozole  toxicities:  Joint pains better than when she was on letrozole  Fatigue better   Insomnia: no different.   Breast cancer surveillance:  Mammogram 08/04/2024 at [facility]: Benign Breast exam 09/15/2024: Benign   Return to clinic in 1 year for follow-up

## 2024-09-15 NOTE — Progress Notes (Signed)
 Patient Care Team: Douglass Gerard VEAR DEVONNA as PCP - General (Physician Assistant) Aron Shoulders, MD as Consulting Physician (General Surgery) Odean Potts, MD as Consulting Physician (Hematology and Oncology) Dewey Rush, MD as Consulting Physician (Radiation Oncology)  DIAGNOSIS:  Encounter Diagnosis  Name Primary?   Malignant neoplasm of upper-outer quadrant of left breast in female, estrogen receptor positive (HCC) Yes    SUMMARY OF ONCOLOGIC HISTORY: Oncology History  Malignant neoplasm of upper-outer quadrant of left breast in female, estrogen receptor positive (HCC)  09/03/2019 Initial Diagnosis   Routine screening mammogram detected a 1.4cm indeterminate left breast mass. US  showed a 1.0cm left breast mass and a 0.5cm lymph node with cortical thickening at the 2:00 position in the left breast. 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   09/10/2019 Cancer Staging   Staging form: Breast, AJCC 8th Edition - Clinical stage from 09/10/2019: Stage IA (cT1c, cN0, cM0, G2, ER+, PR+, HER2-)   09/25/2019 Surgery   Left lumpectomy [facility]) [phone]): IDC, grade 2, 1.2cm, with intermediate grade DCIS, 5 axillary lymph nodes negative, clear margins.    09/25/2019 Cancer Staging   Staging form: Breast, AJCC 8th Edition - Pathologic stage from 09/25/2019: Stage IA (pT1c, pN0, cM0, G2, ER+, PR+, HER2-)   11/03/2019 Oncotype testing   The Oncotype DX score was 26 predicting a risk of outside the breast recurrence over the next 9 years of 16% if the patient's only systemic therapy is tamoxifen for 5 years.    11/20/2019 - 01/22/2020 Chemotherapy   dexamethasone  (DECADRON ) 4 MG tablet, 4 mg (100 % of original dose 4 mg), Oral, Daily, 1 of 1 cycle, Start date: 11/06/2019, End date: 12/11/2019. Dose modification: 4 mg (original dose 4 mg, Cycle 0)  palonosetron  (ALOXI ) injection 0.25 mg, 0.25 mg, Intravenous,  Once, 4 of 4  cycles. Administration: 0.25 mg (11/20/2019), 0.25 mg (12/11/2019), 0.25 mg (01/01/2020), 0.25 mg (01/22/2020)  pegfilgrastim -jmdb (FULPHILA ) injection 6 mg, 6 mg, Subcutaneous,  Once, 4 of 4 cycles. Administration: 6 mg (11/22/2019), 6 mg (12/13/2019), 6 mg (01/03/2020), 6 mg (01/24/2020)  cyclophosphamide  (CYTOXAN ) 1,260 mg in sodium chloride  0.9 % 250 mL chemo infusion, 600 mg/m2 = 1,260 mg, Intravenous,  Once, 4 of 4 cycles. Administration: 1,260 mg (11/20/2019), 1,260 mg (12/11/2019), 1,260 mg (01/01/2020), 1,260 mg (01/22/2020)  DOCEtaxel  (TAXOTERE ) 160 mg in sodium chloride  0.9 % 250 mL chemo infusion, 75 mg/m2 = 160 mg, Intravenous,  Once, 4 of 4 cycles. Administration: 160 mg (11/20/2019), 160 mg (12/11/2019), 160 mg (01/01/2020), 160 mg (01/22/2020).   02/16/2020 - 03/12/2020 Radiation Therapy   The patient initially received a dose of 42.56 Gy in 16 fractions to the breast using whole-breast tangent fields. This was delivered using a 3-D conformal technique. The patient then received a boost to the seroma. This delivered an additional 10 Gy in 33fractions using an en face electron field due to the depth of the seroma. The total dose was 52.56Gy.   04/2020 - 04/2027 Anti-estrogen oral therapy   Anastrozole      CHIEF COMPLIANT: Surveillance of breast cancer  HISTORY OF PRESENT ILLNESS:   History of Present Illness Hannah Wallace is a 68 year old female with breast cancer who presents with joint pain and concerns about medication side effects.  She experiences joint pain in her knees, hips, and lower back, which she attributes to anastrozole . The pain began in her knees and has extended to  her lower back. Despite the discomfort, she continues the medication. She is concerned about her bone density due to a diagnosis of osteopenia and worries about further deterioration. Her next bone density scan is scheduled for next year.     ALLERGIES:  has no known allergies.  MEDICATIONS:  Current Outpatient  Medications  Medication Sig Dispense Refill   anastrozole  (ARIMIDEX ) 1 MG tablet Take 1 tablet by mouth daily. 90 tablet 1   conjugated estrogens (PREMARIN) vaginal cream Place 1 applicator vaginally.     ezetimibe  (ZETIA ) 10 MG tablet Take 1 tablet (10 mg total) by mouth daily.     Omega-3 Fatty Acids (OMEGA-3 FISH OIL) 1200 MG CAPS Take 2 capsules by mouth daily.     OVER THE COUNTER MEDICATION Take by mouth daily. Calcium  (1000mg )- Magnesium  (500mg )- Zinc (25mg - with Vitamin D3 (5mcg)     OVER THE COUNTER MEDICATION Take by mouth daily. Vitamin A (900mcg) - D (5000iu) - K ( )     Polyethyl Glycol-Propyl Glycol (SYSTANE) 0.4-0.3 % SOLN Place 1 drop into both eyes daily.     vitamin E 180 MG (400 UNITS) capsule Take 400 Units by mouth daily.     No current facility-administered medications for this visit.    PHYSICAL EXAMINATION: ECOG PERFORMANCE STATUS: 1 - Symptomatic but completely ambulatory  Vitals:   09/15/24 0931  BP: (!) 140/82  Pulse: 85  Resp: 16  Temp: 97.7 F (36.5 C)  SpO2: 95%   Filed Weights   09/15/24 0931  Weight: 212 lb 11.2 oz (96.5 kg)    Physical Exam No palpable lumps nodules bilateral breasts or axilla  (exam performed in the presence of a chaperone)  LABORATORY DATA:  I have reviewed the data as listed    Latest Ref Rng & Units 01/22/2020    8:01 AM 01/01/2020   11:03 AM 12/11/2019   11:07 AM  CMP  Glucose 70 - 99 mg/dL 91  90  81   BUN 8 - 23 mg/dL 20  21  23    Creatinine 0.44 - 1.00 mg/dL 9.31  9.21  9.05   Sodium 135 - 145 mmol/L 139  139  138   Potassium 3.5 - 5.1 mmol/L 3.9  4.3  3.8   Chloride 98 - 111 mmol/L 109  106  103   CO2 22 - 32 mmol/L 21  24  24    Calcium  8.9 - 10.3 mg/dL 8.8  9.5  9.1   Total Protein 6.5 - 8.1 g/dL 6.7  7.2  7.0   Total Bilirubin 0.3 - 1.2 mg/dL 0.3  0.4  1.0   Alkaline Phos 38 - 126 U/L 81  86  59   AST 15 - 41 U/L 15  16  16    ALT 0 - 44 U/L 9  13  18      Lab Results  Component Value Date   WBC  12.3 (H) 01/22/2020   HGB 12.4 01/22/2020   HCT 36.8 01/22/2020   MCV 90.4 01/22/2020   PLT 290 01/22/2020   NEUTROABS 9.6 (H) 01/22/2020    ASSESSMENT & PLAN:  Malignant neoplasm of upper-outer quadrant of left breast in female, estrogen receptor positive (HCC) 09/25/2019:Left lumpectomy ([facility]): IDC, grade 2, 1.2cm, with intermediate grade DCIS, 5 axillary lymph nodes negative, clear margins. HER-2 - by FISH, ER+ 90%, PR+ 100%, Ki67 2%,  Oncotype DX score 26   Treatment plan:  1. Adjuvant chemotherapy with Taxotere  and Cytoxan  every 3 weeks x 4 completed  01/22/20 2.  Adjuvant radiation therapy 02/17/2020- 03/12/20 3.  Adjuvant antiestrogen therapy started April 06, 2020 changed to letrozole  January 2025 (muscle aches and pains in hip and knee), changed back to anastrozole  01/11/2024 because of joint pains on letrozole  but worse ------------------------------------------------------------------------------------------------------------------------------------------------------------------- Anastrozole  toxicities:  Joint pains better than when she was on letrozole  Fatigue better   Insomnia: no different.   Breast cancer surveillance:  Mammogram 08/04/2024 at [facility]: Benign Breast exam 09/15/2024: Benign   We will request breast cancer index determine if she would benefit from external endocrine therapy. I will call her with the result of this test and then make additional appointments accordingly. Assessment & Plan Osteopenia Concern about worsening bone density due to long-term aromatase inhibitor therapy. - Monitor bone density with DEXA scan with her primary care physician      No orders of the defined types were placed in this encounter.  The patient has a good understanding of the overall plan. she agrees with it. she will call with any problems that may develop before the next visit here.  I personally spent a total of 30 minutes in the care of the patient today including  preparing to see the patient, getting/reviewing separately obtained history, performing a medically appropriate exam/evaluation, counseling and educating, placing orders, referring and communicating with other health care professionals, documenting clinical information in the EHR, independently interpreting results, communicating results, and coordinating care.   Viinay K Chane Magner, MD 09/15/24

## 2024-09-16 ENCOUNTER — Telehealth: Payer: Self-pay

## 2024-09-16 NOTE — Telephone Encounter (Signed)
 [facility] order completed per MD and faxed. Confirmation received. Pt scheduled for MD f/u to discuss results 10/06/24 at 1015. She is agreeable.

## 2024-10-01 ENCOUNTER — Telehealth: Payer: Self-pay

## 2024-10-01 NOTE — Telephone Encounter (Signed)
 Called pt and informed her that Dr. Odean suggested taking the Anastrazole every other day till June which will be her 5 year mark. Pt understood and stated she would continue taking it as originally perscribed unless joint pain worsened

## 2024-10-06 ENCOUNTER — Encounter: Payer: Self-pay | Admitting: Hematology and Oncology

## 2024-10-06 ENCOUNTER — Inpatient Hospital Stay: Admitting: Hematology and Oncology

## 2025-04-15 ENCOUNTER — Inpatient Hospital Stay: Attending: Hematology and Oncology | Admitting: Hematology and Oncology
